# Patient Record
Sex: Male | Born: 1940 | Race: White | Hispanic: No | Marital: Married | Smoking: Former smoker
Health system: Southern US, Community
[De-identification: ages and names within clinical notes are randomized; demographics above are authoritative.]

## PROBLEM LIST (undated history)

## (undated) DIAGNOSIS — E785 Hyperlipidemia, unspecified: Secondary | ICD-10-CM

## (undated) DIAGNOSIS — I350 Nonrheumatic aortic (valve) stenosis: Secondary | ICD-10-CM

## (undated) DIAGNOSIS — I2510 Atherosclerotic heart disease of native coronary artery without angina pectoris: Secondary | ICD-10-CM

## (undated) DIAGNOSIS — J449 Chronic obstructive pulmonary disease, unspecified: Secondary | ICD-10-CM

## (undated) DIAGNOSIS — I10 Essential (primary) hypertension: Secondary | ICD-10-CM

## (undated) DIAGNOSIS — M109 Gout, unspecified: Secondary | ICD-10-CM

## (undated) HISTORY — PX: ROTATOR CUFF REPAIR: SHX139

## (undated) HISTORY — PX: CARDIAC CATHETERIZATION: SHX172

---

## 1996-07-18 HISTORY — PX: CORONARY ANGIOPLASTY WITH STENT PLACEMENT: SHX49

## 2010-03-19 ENCOUNTER — Encounter: Payer: Self-pay | Admitting: Internal Medicine

## 2010-04-15 ENCOUNTER — Encounter: Payer: Self-pay | Admitting: Internal Medicine

## 2010-10-04 ENCOUNTER — Ambulatory Visit: Payer: Self-pay | Admitting: Family Medicine

## 2011-09-21 ENCOUNTER — Encounter: Payer: Self-pay | Admitting: Internal Medicine

## 2011-10-16 ENCOUNTER — Encounter: Payer: Self-pay | Admitting: Internal Medicine

## 2011-11-16 ENCOUNTER — Encounter: Payer: Self-pay | Admitting: Internal Medicine

## 2011-11-27 ENCOUNTER — Ambulatory Visit: Payer: Self-pay

## 2011-12-17 ENCOUNTER — Encounter: Payer: Self-pay | Admitting: Internal Medicine

## 2012-03-01 ENCOUNTER — Encounter: Payer: Self-pay | Admitting: Internal Medicine

## 2012-03-15 ENCOUNTER — Encounter: Payer: Self-pay | Admitting: Internal Medicine

## 2012-04-15 ENCOUNTER — Encounter: Payer: Self-pay | Admitting: Internal Medicine

## 2012-10-04 ENCOUNTER — Ambulatory Visit: Payer: Self-pay | Admitting: Gastroenterology

## 2012-11-21 ENCOUNTER — Ambulatory Visit: Payer: Self-pay | Admitting: Internal Medicine

## 2013-11-13 ENCOUNTER — Ambulatory Visit: Payer: Self-pay | Admitting: Internal Medicine

## 2015-03-17 ENCOUNTER — Ambulatory Visit
Admission: EM | Admit: 2015-03-17 | Discharge: 2015-03-17 | Disposition: A | Discharge status: Home / Self Care | Payer: Medicare Other | Attending: Family Medicine | Admitting: Family Medicine

## 2015-03-17 DIAGNOSIS — S6981XA Other specified injuries of right wrist, hand and finger(s), initial encounter: Secondary | ICD-10-CM

## 2015-03-17 DIAGNOSIS — W458XXA Other foreign body or object entering through skin, initial encounter: Secondary | ICD-10-CM | POA: Diagnosis not present

## 2015-03-17 DIAGNOSIS — S6991XA Unspecified injury of right wrist, hand and finger(s), initial encounter: Secondary | ICD-10-CM

## 2015-03-17 HISTORY — DX: Essential (primary) hypertension: I10

## 2015-03-17 HISTORY — DX: Gout, unspecified: M10.9

## 2015-03-17 HISTORY — DX: Hyperlipidemia, unspecified: E78.5

## 2015-03-17 HISTORY — DX: Chronic obstructive pulmonary disease, unspecified: J44.9

## 2015-03-17 MED ORDER — AMOXICILLIN-POT CLAVULANATE 875-125 MG PO TABS: 1.0000 | ORAL_TABLET | Freq: Two times a day (BID) | ORAL | Status: DC

## 2015-03-17 MED ORDER — TETANUS-DIPHTH-ACELL PERTUSSIS 5-2.5-18.5 LF-MCG/0.5 IM SUSP
0.5000 mL | Freq: Once | INTRAMUSCULAR | Status: AC
  Administered 2015-03-17: 0.5 mL via INTRAMUSCULAR

## 2015-03-17 MED ORDER — LIDOCAINE HCL (PF) 1 % IJ SOLN
2.0000 mL | Freq: Once | INTRAMUSCULAR | Status: AC
  Administered 2015-03-17: 2 mL via INTRADERMAL

## 2015-03-17 NOTE — ED Notes (Signed)
Pt states "I stuck a fishhook in my right index finger 1 hours ago."

## 2015-03-17 NOTE — Discharge Instructions (Signed)
Fingertip Injuries and Amputations °Fingertip injuries are common and often get injured because they are last to escape when pulling your hand out of harm's way. You have amputated (cut off) part of your finger. How this turns out depends largely on how much was amputated. If just the tip is amputated, often the end of the finger will grow back and the finger may return to much the same as it was before the injury.  °If more of the finger is missing, your caregiver has done the best with the tissue remaining to allow you to keep as much finger as is possible. Your caregiver after checking your injury has tried to leave you with a painless fingertip that has durable, feeling skin. If possible, your caregiver has tried to maintain the finger's length and appearance and preserve its fingernail.  °Please read the instructions outlined below and refer to this sheet in the next few weeks. These instructions provide you with general information on caring for yourself. Your caregiver may also give you specific instructions. While your treatment has been done according to the most current medical practices available, unavoidable complications occasionally occur. If you have any problems or questions after discharge, please call your caregiver. °HOME CARE INSTRUCTIONS  °· You may resume normal diet and activities as directed or allowed. °· Keep your hand elevated above the level of your heart. This helps decrease pain and swelling. °· Keep ice packs (or a bag of ice wrapped in a towel) on the injured area for 15-20 minutes, 03-04 times per day, for the first two days. °· Change dressings if necessary or as directed. °· Clean the wound daily or as directed. °· Only take over-the-counter or prescription medicines for pain, discomfort, or fever as directed by your caregiver. °· Keep appointments as directed. °SEEK IMMEDIATE MEDICAL CARE IF: °· You develop redness, swelling, numbness or increasing pain in the wound. °· There is  pus coming from the wound. °· You develop an unexplained oral temperature above 102° F (38.9° C) or as your caregiver suggests. °· There is a foul (bad) smell coming from the wound or dressing. °· There is a breaking open of the wound (edges not staying together) after sutures or staples have been removed. °MAKE SURE YOU:  °· Understand these instructions. °· Will watch your condition. °· Will get help right away if you are not doing well or get worse. °Document Released: 09/22/2005 Document Revised: 01/24/2012 Document Reviewed: 08/21/2008 °ExitCare® Patient Information ©2015 [employer]. This information is not intended to replace advice given to you by your health care provider. Make sure you discuss any questions you have with your health care provider. ° °

## 2015-03-18 NOTE — ED Provider Notes (Signed)
CSN: [id number]     Arrival date & time 03/17/15  1527 History   First MD Initiated Contact with Patient 03/17/15 1616     Chief Complaint  Patient presents with  . Puncture Wound    Patient is a 74 y.o. male presenting with hand pain. The history is provided by the patient.  Hand Pain This is a new problem. The current episode started 1 to 2 hours ago (right index finger pain after getting fishhook stuck in right index finger while fishing; occurrred about 1-2 hours ago). He has tried nothing (patient does not recall his last tetanus vaccination) for the symptoms.    Past Medical History  Diagnosis Date  . Hypertension   . Gout   . Hyperlipemia   . COPD (chronic obstructive pulmonary disease)    No past surgical history on file. History reviewed. No pertinent family history. History  Substance Use Topics  . Smoking status: Never Smoker   . Smokeless tobacco: Not on file  . Alcohol Use: 0.6 oz/week    1 Shots of liquor per week    Review of Systems  Allergies  Review of patient's allergies indicates no known allergies.  Home Medications   Prior to Admission medications   Medication Sig Start Date End Date Taking? Authorizing Provider  allopurinol (ZYLOPRIM) 100 MG tablet Take 100 mg by mouth daily.   Yes Historical Provider, MD  metoprolol succinate (TOPROL-XL) 50 MG 24 hr tablet Take 50 mg by mouth daily. Take with or immediately following a meal.   Yes Historical Provider, MD  rosuvastatin (CRESTOR) 20 MG tablet Take 20 mg by mouth daily.   Yes Historical Provider, MD  amoxicillin-clavulanate (AUGMENTIN) 875-125 MG per tablet Take 1 tablet by mouth 2 (two) times daily. 03/17/15   Sean Mccallumrlando Nayab Aten, MD   BP 128/81 mmHg  Pulse 73  Temp(Src) 97.5 F (36.4 C) (Tympanic)  Resp 16  Ht 5\' 10"  (1.778 m)  Wt 245 lb (111.131 kg)  BMI 35.15 kg/m2  SpO2 99% Physical Exam  Constitutional: He appears well-developed and well-nourished. No distress.  Musculoskeletal: Normal range of  motion.       Right hand: He exhibits tenderness (moderate at dorsal side of right index finger at site of embedded fishhook). He exhibits normal range of motion, no bony tenderness, normal two-point discrimination, normal capillary refill, no deformity and no swelling. Decreased sensation is not present in the ulnar distribution, is not present in the medial distribution and is not present in the radial distribution. Normal strength noted. He exhibits no finger abduction, no thumb/finger opposition and no wrist extension trouble.       Hands: Hand fingers neurovascularly intact  Skin: He is not diaphoretic.  Nursing note and vitals reviewed.   ED Course  Procedures (including critical care time) Labs Review Labs Reviewed - No data to display  Imaging Review No results found.   MDM   1. Fishhook injury to finger, right, initial encounter    1. Area cleaned with and soaked in antiseptic/antibacterial solution; informed consent obtained for removal procedure; area anesthetized with 1% lidocaine; 2 other non-embedded fishhook prongs were cut/removed; embedded fishhook removed in its entirety by inserting an 18 gauge needle to cover the fishhook barb and slowly pulling backwards; patient tolerated procedure well. Patient able to move finger normally; normal strength.  Area wound cleaned and dressed.  2. Tetanus vaccine given  3. Prophylactic antibiotic given as per orders; risks, benefits, potential side effects reviewed with patient  4. Wound care instructions given  5. F/u prn if sign's of infection or other symptoms   Sean Mccallum, MD 03/18/15 1032

## 2015-03-26 ENCOUNTER — Telehealth: Payer: Self-pay

## 2015-11-27 ENCOUNTER — Ambulatory Visit: Payer: Medicare Other | Attending: Rheumatology | Admitting: Occupational Therapy

## 2015-11-27 DIAGNOSIS — M2560 Stiffness of unspecified joint, not elsewhere classified: Secondary | ICD-10-CM | POA: Insufficient documentation

## 2015-11-27 DIAGNOSIS — M79641 Pain in right hand: Secondary | ICD-10-CM | POA: Insufficient documentation

## 2015-11-27 NOTE — Therapy (Signed)
[facility]INE [address], [place], [address] Phone: [phone]   Fax:  [phone]  Occupational Therapy Treatment  Patient Details  Name: DEANGELO BERNS MRN: [id number] Date of Birth: Jul 19, 1941 Referring Provider: Gavin Potters  Encounter Date: 11/27/2015      OT End of Session - 11/27/15 1056    Visit Number 1   Number of Visits 3   Date for OT Re-Evaluation 12/18/15   OT Start Time 0955   OT Stop Time 1036   OT Time Calculation (min) 41 min   Activity Tolerance Patient tolerated treatment well   Behavior During Therapy [facility] for tasks assessed/performed      Past Medical History  Diagnosis Date  . Hypertension   . Gout   . Hyperlipemia   . COPD (chronic obstructive pulmonary disease)     No past surgical history on file.  There were no vitals filed for this visit.  Visit Diagnosis:  Pain of right hand - Plan: Ot plan of care cert/re-cert  Stiffness in joint - Plan: Ot plan of care cert/re-cert      Subjective Assessment - 11/27/15 1046    Subjective  Had this pain for about 2 yrs, I just push thru it - have high pain tolerance- it is the middle finger on the R  - had shot about 6 months ago on the R , 4 months on the L middle finger - it helps for few months - the come back    Patient Stated Goals Hope that I can get the pain better - it is my R hand - hurts to make tight fist - I can do most all things but just push thru it    Currently in Pain? Yes   Pain Score 9    Pain Location Finger (Comment which one)   Pain Orientation Right   Pain Descriptors / Indicators Pins and needles;Aching   Pain Type Chronic pain   Pain Onset More than a month ago   Pain Frequency Constant            [facility] OT Assessment - 11/27/15 0001    Assessment   Diagnosis R middle finger pain    Referring Provider [facility]   Onset Date 05/16/15   Assessment Pt present with pain in the R 3rd digits MC more than  PIP - report had issues for about 2 yrs now - had shots in the past - last one on the R was 6 months ago, and L 3rd digit 4 months ago - refer to hand therapy last week    Balance Screen   Has the patient fallen in the past 6 months No   Has the patient had a decrease in activity level because of a fear of falling?  No   Is the patient reluctant to leave their home because of a fear of falling?  No   Home  Environment   Lives With Spouse   Prior Function   Level of Independence Independent   Vocation Full time employment   Leisure Play golf, fish, garden, read newspaper and play on phone    Strength   Right Hand Grip (lbs) 50   Right Hand Lateral Pinch 21 lbs   Right Hand 3 Point Pinch 16 lbs   Left Hand Grip (lbs) 49   Left Hand Lateral Pinch 23 lbs   Left Hand 3 Point Pinch 20 lbs   Right Hand AROM  R Index  MCP 0-90 60 Degrees   R Long  MCP 0-90 75 Degrees   R Ring  MCP 0-90 75 Degrees   R Little  MCP 0-90 85 Degrees   Left Hand AROM   L Index  MCP 0-90 75 Degrees   L Long  MCP 0-90 80 Degrees   L Ring  MCP 0-90 85 Degrees   L Little  MCP 0-90 90 Degrees       Pt ed on joint  protection and AE - reinforce and provided hand outs Reviewed HEP for ROM                    OT Education - 11/27/15 1055    Education provided Yes   Education Details HEP, joint protection    Person(s) Educated Patient   Methods Explanation;Demonstration;Tactile cues;Verbal cues;Handout   Comprehension Verbal cues required;Returned demonstration;Verbalized understanding          OT Short Term Goals - 11/27/15 1059    OT SHORT TERM GOAL #1   Title Pt to be independent in HEP to increase MC flexion in R hand by 5-10 degrees    Baseline MC's flexion on R hand  65 to 75 2nd th 4th     Time 2   Period Weeks   Status New           OT Long Term Goals - 11/27/15 1221    OT LONG TERM GOAL #1   Title Pain on PRWHE decrease by at least 10 points    Baseline PRWHE 40/50 for  pain    Time 3   Period Weeks   Status New   OT LONG TERM GOAL #2   Title Pt to verbalize 3 joint protecion pirinciples to use in ADL's and IADL's   Baseline no knowledge - push thru pain and doing tasks   Time 3   Period Weeks   Status New               Plan - 11/27/15 1056    Clinical Impression Statement Pt present with pain in R hand more than L - 3rd digit and MC more than PIP - not edema measured - but pt present with decrease ROM in R hand all MC's , decrease grip compare to L - pt in need for education of joint protection  and adaptation -  as well as HEP to increase ROM whthout increase pain    Pt will benefit from skilled therapeutic intervention in order to improve on the following deficits (Retired) Impaired flexibility;Decreased knowledge of use of DME;Impaired UE functional use;Pain;Decreased strength   Rehab Potential Fair   OT Frequency 1x / week   OT Duration 4 weeks   OT Treatment/Interventions Self-care/ADL training;Parrafin;Contrast Bath;DME and/or AE instruction;Patient/family education;Therapeutic exercises;Ultrasound;Manual Therapy   Plan assess pain and if modified activities    OT Home Exercise Plan se pt instruction   Consulted and Agree with Plan of Care Patient        Problem List There are no active problems to display for this patient.   Oletta CohnuPreez, Ajeet Casasola OTR/L,CLT 11/27/2015, 12:40 PM  [facility] [address] [place], [place], [address] Phone: [phone]   Fax:  [phone]  Name: Eulis ManlyJoe A Lupien MRN: [id number] Date of Birth: Jul 31, 1941

## 2015-11-27 NOTE — Patient Instructions (Signed)
COntrast to do at home  Tendonglides - to slight pull - each step 8 reps  Joint protection principles reviewed and ed on - hand out  AE info review and ed on - Hand out

## 2015-12-04 ENCOUNTER — Ambulatory Visit: Payer: Medicare Other | Admitting: Occupational Therapy

## 2015-12-04 DIAGNOSIS — M79641 Pain in right hand: Secondary | ICD-10-CM

## 2015-12-04 DIAGNOSIS — M2560 Stiffness of unspecified joint, not elsewhere classified: Secondary | ICD-10-CM

## 2015-12-04 NOTE — Therapy (Signed)
[place] [facility] [address], [place], [address] Phone: [phone]   Fax:  [phone]  Occupational Therapy Treatment and discharge  Patient Details  Name: Sean Hubbard MRN: [id number] Date of Birth: 1941/04/03 Referring Provider: Jefm Bryant  Encounter Date: 12/04/2015      OT End of Session - 12/04/15 1044    Visit Number 2   Number of Visits 2   Date for OT Re-Evaluation 12/04/15   OT Start Time 1005   OT Stop Time 1036   OT Time Calculation (min) 31 min   Activity Tolerance Patient tolerated treatment well   Behavior During Therapy [facility] for tasks assessed/performed      Past Medical History  Diagnosis Date  . Hypertension   . Gout   . Hyperlipemia   . COPD (chronic obstructive pulmonary disease)     No past surgical history on file.  There were no vitals filed for this visit.  Visit Diagnosis:  Pain of right hand  Stiffness in joint      Subjective Assessment - 12/04/15 1013    Subjective  I did the exercises you told me - pain was better in my hands until this am - but I think it is the rain moving in - mostly in my middle knuckle on the R hand and L  2nd finger more than 3rd today - I did pay attention how I grip thinks , pick up things like you told met   Patient Sean Hubbard that I can get the pain better - it is my R hand - hurts to make tight fist - I can do most all things but just push thru it    Currently in Pain? Yes   Pain Score 8    Pain Location Finger (Comment which one)   Pain Orientation Right   Pain Descriptors / Indicators Aching            [facility] OT Assessment - 12/04/15 0001    Strength   Right Hand Grip (lbs) 62   Left Hand Grip (lbs) 56   Right Hand AROM   R Index  MCP 0-90 75 Degrees   R Long  MCP 0-90 80 Degrees   R Ring  MCP 0-90 85 Degrees   R Little  MCP 0-90 85 Degrees                  OT Treatments/Exercises (OP) - 12/04/15 0001    Hand Exercises   Other Hand Exercises AAROM of MC fllexion at all digits R hand , AROM  mc flexion , intrinsic fist AROM  , composite fist to 2 fingers out of palm , 1 finger and then touching palm but not tight only AROM     Other Hand Exercises pt had increase AROM coming in - after parafin improve another 5 degrees without increase pain - grip also improve bilatreral   RUE Paraffin   Number Minutes Paraffin 10 Minutes   RUE Paraffin Location Hand   Comments with heatingpad around prior to  ROM to decrease pain and increase ROM    Manual Therapy   Manual therapy comments SOft tissue mobs to 3rd MC and 2nd MC , joint mobs as well as gentle traction to 3rd digit prior to ROM - no increase pain                 OT Education - 12/04/15 1044    Education  provided Yes   Education Details see pt instruction   Person(s) Educated Patient   Methods Explanation;Demonstration;Tactile cues;Verbal cues   Comprehension Returned demonstration;Verbalized understanding;Verbal cues required          OT Short Term Goals - 2016/01/01 1047    OT SHORT TERM GOAL #1   Title Pt to be independent in HEP to increase MC flexion in R hand by 5-10 degrees    Baseline increase to 75-85 degrees    Status Achieved           OT Long Term Goals - January 01, 2016 1048    OT LONG TERM GOAL #1   Title Pain on PRWHE decrease by at least 10 points    Baseline PRWHE for pain decrease to 27/50   Status Achieved   OT LONG TERM GOAL #2   Title Pt to verbalize 3 joint protecion pirinciples to use in ADL's and IADL's   Baseline report using palms to hold objects , larger handles , avoid tight grip on holding phone or table    Status Achieved               Plan - Jan 01, 2016 1045    Clinical Impression Statement Pt report had less pain until this am - pain this am again 8/10 at [facility] [facility] R hand but after parafin decrease to 3/10 - pt showed coming in and again after session increase flexion at all digits MC flexion  on R hand - and increase grip strength - pt report understainding of joint proteciton - and met goals - pt discharge  with HEP and  recommend parafin bath     Pt will benefit from skilled therapeutic intervention in order to improve on the following deficits (Retired) Impaired flexibility;Decreased knowledge of use of DME;Impaired UE functional use;Pain;Decreased strength   Rehab Potential Fair   Plan discharge with HEP   OT Home Exercise Plan se pt instruction   Consulted and Agree with Plan of Care Patient          G-Codes - 2016-01-01 1049    Functional Assessment Tool Used PRWHE , pain , ROM , grip and clinical judgement   Functional Limitation Self care   Self Care Current Status (O8875) At least 1 percent but less than 20 percent impaired, limited or restricted   Self Care Goal Status (Z9728) At least 1 percent but less than 20 percent impaired, limited or restricted   Self Care Discharge Status [phone]) At least 1 percent but less than 20 percent impaired, limited or restricted      Problem List There are no active problems to display for this patient.   Rosalyn Gess OTR/L,CLT 01-01-2016, 10:51 AM  [place] [facility] [address], [place], [address] Phone: [phone]   Fax:  [phone]  Name: Sean Hubbard MRN: [id number] Date of Birth: 1941-06-23

## 2015-12-04 NOTE — Patient Instructions (Signed)
Cont with same HEP but during composite fist - need to grip towards base of palm   Cont with joint protection principles  And Recommend parafin bath to use in am and pm for decrease pain and increase ROM

## 2016-09-15 ENCOUNTER — Ambulatory Visit: Payer: Medicare Other | Attending: Orthopedic Surgery | Admitting: Physical Therapy

## 2016-09-15 DIAGNOSIS — M5442 Lumbago with sciatica, left side: Secondary | ICD-10-CM | POA: Diagnosis present

## 2016-09-15 DIAGNOSIS — M6281 Muscle weakness (generalized): Secondary | ICD-10-CM

## 2016-09-15 DIAGNOSIS — M2560 Stiffness of unspecified joint, not elsewhere classified: Secondary | ICD-10-CM

## 2016-09-15 DIAGNOSIS — R293 Abnormal posture: Secondary | ICD-10-CM | POA: Diagnosis present

## 2016-09-16 ENCOUNTER — Encounter: Payer: Self-pay | Admitting: Physical Therapy

## 2016-09-16 NOTE — Therapy (Addendum)
[facility] [address] [place], [place], [address] Phone: [phone]   Fax:  [phone]  Physical Therapy Evaluation  Patient Details  Name: Sean Hubbard MRN: [id number] Date of Birth: 19-Jul-1941 Referring Provider: Altamese CabalMaurice Jones, PA-C  Encounter Date: 09/15/2016    Past Medical History:  Diagnosis Date  . COPD (chronic obstructive pulmonary disease) (HCC)   . Gout   . Hyperlipemia   . Hypertension     History reviewed. No pertinent surgical history.  There were no vitals filed for this visit.    Pt. reports a "pop" in back while playing golf with swing on a downhill lie (2 weeks ago yesterday).  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  Pt. states soreness has referred into L hip down to knee and lower leg.   Pt. is taking Prednisone (5th day of 10 day course).  Pt. also using 800 mg. of ibuprofen as needed.  Pt. states he will have a MRI on 12/29 if pain is not better.  Pt. reports no pain currently at rest and 5/10 LBP with walking and >8-9/10 initially after injury/prior to Prednisone.  Pt. denies muscle weakness in LE.  No bowel and bladder issues.  Pt. reports difficulty with forward flexion/ donning and doffing shoes.      See HEP (handouts).     Pt. is a pleasant 75 y/o male with 2 weeks of acute low back pain with L hip/LE radicular symptoms after playing golf.  Pt. reports feeling a "pop" in low back during golfswing on a downhill lie.  Pt. reports 0/10 low back pain currently at rest in chair, 5/10 pain with walking and >8-9/10 L sided LBP at worst with repetitive movement/ increase activity.  Lumbar flexion limited 75% (L sided pain and increase radicular symptoms).  No pain with lumbar ext./ repeated movement.  R rotn./ lateral flexion away from L side (painful)- decrease pain with L rotn./ L lat. flexion.  B LE muscle  strength grossly 5/5 MMT.  (+) L SLR with hamstring flexibility to 35 deg. as compared to R LE 60 deg.  Significant lumbar hypomobility with PA mobs./ joint assessment.  Good pelvic alignment.  Pt. will benefit from skilled PT services to increase lumbar mobility/ core stability and pain mgmt. to promote return to playing golf.          PT Education - 09/21/16 0709    Education provided Yes   Education Details LE/lumbar stretching program.  Pt. instructed in prone press-ups/icing.     Person(s) Educated Patient   Methods Explanation;Demonstration;Tactile cues;Verbal cues;Handout   Comprehension Verbalized understanding;Returned demonstration             PT Long Term Goals - 09/21/16 0743      PT LONG TERM GOAL #1   Title Pt. I with HEP to increase L hamstring flexibility to >60 deg. to improve pain-free mobility/ return to playing golf.     Baseline L hamstring 35 deg. (+) SLR.   Time 4   Period Weeks   Status New     PT LONG TERM GOAL #2   Title Pt. will decrease Modified Oswestry to <18% to improve self-perceived disability.     Baseline MODI on 11/1  (26%)   Time 4   Period Weeks   Status New     PT LONG TERM GOAL #3  Title Pt. able to demonstrate 50% increase in lumbar flexion to improve pain-free mobility/ return to playing golf.     Baseline 75% limited lumbar flexion with increase L LE radicular symptoms.     Time 4   Period Weeks   Status New     PT LONG TERM GOAL #4   Title Pt. will report no L LE radicular symptoms over 1 week to increase pain-free mobility with all tasks.    Baseline L hip/ LE radicular symptoms.     Time 4   Period Weeks   Status New     PT LONG TERM GOAL #5   Title Pt. able to return to playing 18 holes of golf with no increase c/o low back pain/ radicular symptoms.     Baseline unable to play golf at this time due to pain.    Time 4   Period Weeks   Status New       Patient will benefit from skilled therapeutic intervention  in order to improve the following deficits and impairments:  Decreased activity tolerance, Decreased endurance, Decreased range of motion, Decreased strength, Hypomobility, Pain, Improper body mechanics, Postural dysfunction, Increased muscle spasms, Impaired flexibility  Visit Diagnosis: Acute bilateral low back pain with left-sided sciatica  Joint stiffness  Muscle weakness (generalized)  Abnormal posture     Problem List There are no active problems to display for this patient.  Cammie McgeeMichael C Rhythm Gubbels, PT, DPT # [id number] Michaelyn BarterLaura Bissing, SPT 09/21/2016, 7:52 AM  [facility] [address] [place], [place], [address] Phone: [phone]   Fax:  [phone]  Name: Sean Hubbard MRN: [id number] Date of Birth: Apr 07, 1941

## 2016-09-21 ENCOUNTER — Ambulatory Visit: Payer: Medicare Other | Admitting: Physical Therapy

## 2016-09-21 DIAGNOSIS — M5442 Lumbago with sciatica, left side: Secondary | ICD-10-CM | POA: Diagnosis not present

## 2016-09-21 DIAGNOSIS — M2560 Stiffness of unspecified joint, not elsewhere classified: Secondary | ICD-10-CM

## 2016-09-21 DIAGNOSIS — R293 Abnormal posture: Secondary | ICD-10-CM

## 2016-09-21 DIAGNOSIS — M6281 Muscle weakness (generalized): Secondary | ICD-10-CM

## 2016-09-21 NOTE — Addendum Note (Signed)
Addended by: Dorene GrebeSHERK, Abdulrahim Siddiqi C on: 09/21/2016 08:00 AM   Modules accepted: Orders

## 2016-09-22 ENCOUNTER — Encounter: Payer: Self-pay | Admitting: Physical Therapy

## 2016-09-22 NOTE — Therapy (Signed)
[facility] [address] [place], [place], [address] Phone: [phone]   Fax:  [phone]  Physical Therapy Treatment  Patient Details  Name: Sean Hubbard MRN: [id number] Date of Birth: [birth year] Referring Provider: Altamese CabalMaurice Jones, PA-C  Encounter Date: 09/21/2016      PT End of Session - 09/22/16 1727    Visit Number 2   Number of Visits 8   Date for PT Re-Evaluation 10/13/16   Authorization - Visit Number 2   Authorization - Number of Visits 10   PT Start Time 0811   PT Stop Time 0905   PT Time Calculation (min) 54 min   Activity Tolerance Patient tolerated treatment well;No increased pain   Behavior During Therapy WFL for tasks assessed/performed      Past Medical History:  Diagnosis Date  . COPD (chronic obstructive pulmonary disease) (HCC)   . Gout   . Hyperlipemia   . Hypertension     History reviewed. No pertinent surgical history.  There were no vitals filed for this visit.      Subjective Assessment - 09/22/16 1724    Subjective Pt. states he is about 60% better and doing HEP/stretches.  Pt. was able to go fishing with no significant limitations but still uable to play golf.  Pt. hoping to return to playing golf next Tuesday.     Limitations Standing;Lifting;Sitting;Walking;House hold activities;Other (comment)   Currently in Pain? Yes   Pain Score 2    Pain Location Back   Pain Orientation Lower;Left;Mid      OBJECTIVE:  There.ex.:  Reviewed HEP/  Standing lumbar AROM (all planes)- prefers extension.  Manual tx.: Supine LE/lumbar stretches (13 min.).  Prone lumbar extension 4x.  Prone grade II-III PA mobs. 2x 20 sec. (L1-5 region).  No tenderness or pain over SI joint/ lumbar region.      Pt response for medical necessity:  Pt. Benefits from lumbar/LE flexibility to increase mobility and promote return to playing golf.  Pt. Progressing well but remains limited with significant L hamstring tightness/  (+) SLR.         PT Long Term Goals - 09/21/16 0743      PT LONG TERM GOAL #1   Title Pt. I with HEP to increase L hamstring flexibility to >60 deg. to improve pain-free mobility/ return to playing golf.     Baseline L hamstring 35 deg. (+) SLR.   Time 4   Period Weeks   Status New     PT LONG TERM GOAL #2   Title Pt. will decrease Modified Oswestry to <18% to improve self-perceived disability.     Baseline MODI on 11/1  (26%)   Time 4   Period Weeks   Status New     PT LONG TERM GOAL #3   Title Pt. able to demonstrate 50% increase in lumbar flexion to improve pain-free mobility/ return to playing golf.     Baseline 75% limited lumbar flexion with increase L LE radicular symptoms.     Time 4   Period Weeks   Status New     PT LONG TERM GOAL #4   Title Pt. will report no L LE radicular symptoms over 1 week to increase pain-free mobility with all tasks.    Baseline L hip/ LE radicular symptoms.     Time 4   Period Weeks   Status New     PT LONG TERM GOAL #5   Title Pt. able  to return to playing 18 holes of golf with no increase c/o low back pain/ radicular symptoms.     Baseline unable to play golf at this time due to pain.    Time 4   Period Weeks   Status New           Plan - 09/22/16 1728    Clinical Impression Statement Decrease LBP with repeated extension in prone and standing posture.  Pt. remains limited with L SLR/ hip/ lumbar discomfort with flexion/ standing rotn.  No pain in lumbar region with prone grade II-III central/unilateral mobs.  Reassess goals/ modified golf swing next tx. session to determine if return to golf next Tuesday is appropriate.     Rehab Potential Good   PT Frequency 2x / week   PT Duration 4 weeks   PT Treatment/Interventions ADLs/Self Care Home Management;Cryotherapy;Electrical Stimulation;Moist Heat;Gait training;Functional mobility training;Therapeutic activities;Therapeutic exercise;Patient/family education;Neuromuscular  re-education;Manual techniques;Passive range of motion;Energy conservation   PT Next Visit Plan Progress lumbar mobility/ modify golf swing.     PT Home Exercise Plan See handouts   Consulted and Agree with Plan of Care Patient      Patient will benefit from skilled therapeutic intervention in order to improve the following deficits and impairments:  Decreased activity tolerance, Decreased endurance, Decreased range of motion, Decreased strength, Hypomobility, Pain, Improper body mechanics, Postural dysfunction, Increased muscle spasms, Impaired flexibility  Visit Diagnosis: Acute bilateral low back pain with left-sided sciatica  Joint stiffness  Muscle weakness (generalized)  Abnormal posture     Problem List There are no active problems to display for this patient.  Cammie McgeeMichael C Odin Mariani, PT, DPT # [phone] 09/22/2016, 5:32 PM  [place] [facility] [address] [place], [place], [address] Phone: [phone]   Fax:  [phone]  Name: Sean Hubbard MRN: [id number] Date of Birth: April 30, 1941

## 2016-09-23 ENCOUNTER — Ambulatory Visit: Payer: Medicare Other | Admitting: Physical Therapy

## 2016-09-23 ENCOUNTER — Encounter: Payer: Self-pay | Admitting: Physical Therapy

## 2016-09-23 DIAGNOSIS — M6281 Muscle weakness (generalized): Secondary | ICD-10-CM

## 2016-09-23 DIAGNOSIS — M2560 Stiffness of unspecified joint, not elsewhere classified: Secondary | ICD-10-CM

## 2016-09-23 DIAGNOSIS — R293 Abnormal posture: Secondary | ICD-10-CM

## 2016-09-23 DIAGNOSIS — M5442 Lumbago with sciatica, left side: Secondary | ICD-10-CM | POA: Diagnosis not present

## 2016-09-23 NOTE — Therapy (Signed)
[facility] [address] [place], [place], [address] Phone: [phone]   Fax:  [phone]  Physical Therapy Treatment  Patient Details  Name: Sean Hubbard MRN: [id number] Date of Birth: [birth year] Referring Provider: Altamese CabalMaurice Jones, PA-C  Encounter Date: 09/23/2016      PT End of Session - 09/23/16 0813    Visit Number 3   Number of Visits 8   Date for PT Re-Evaluation 10/13/16   Authorization - Visit Number 3   Authorization - Number of Visits 10   PT Start Time 0808   Activity Tolerance Patient tolerated treatment well;No increased pain   Behavior During Therapy WFL for tasks assessed/performed      Past Medical History:  Diagnosis Date  . COPD (chronic obstructive pulmonary disease) (HCC)   . Gout   . Hyperlipemia   . Hypertension     History reviewed. No pertinent surgical history.  There were no vitals filed for this visit.      Subjective Assessment - 09/23/16 0811    Subjective Pt. reports he did something stupid yesterday and vacuumed the house with increase c/o back pain.  Pt. entered PT clinic with moderate L antalgic gait pattern/ lateral sway.  Pt. cancelled golf tee time for next Tuesday due to L LBP.     Limitations Standing;Lifting;Sitting;Walking;House hold activities;Other (comment)   Patient Stated Goals Decrease LBP/ return to golf   Currently in Pain? Yes   Pain Score 3    Pain Location Back   Pain Orientation Left;Mid;Lower   Pain Type Acute pain      OBJECTIVE:  There.ex.:  Nustep L7 5 min. (warm-up/ no charge prior to stretching).  Manual tx.: Supine LE/lumbar stretches (15 min.).  Prone quad/hip flexor stretches 3x each.  Prone grade II-III PA mobs. 2x 20 sec. (T5-L5 region).  No tenderness or pain over SI joint/ lumbar region.                 Pt response for medical necessity:  Pt. Benefits from lumbar/LE flexibility to increase mobility and promote return to playing golf.  Pt.  Progressing well but remains limited with significant L hamstring tightness/ (+) SLR.         PT Long Term Goals - 09/21/16 0743      PT LONG TERM GOAL #1   Title Pt. I with HEP to increase L hamstring flexibility to >60 deg. to improve pain-free mobility/ return to playing golf.     Baseline L hamstring 35 deg. (+) SLR.   Time 4   Period Weeks   Status New     PT LONG TERM GOAL #2   Title Pt. will decrease Modified Oswestry to <18% to improve self-perceived disability.     Baseline MODI on 11/1  (26%)   Time 4   Period Weeks   Status New     PT LONG TERM GOAL #3   Title Pt. able to demonstrate 50% increase in lumbar flexion to improve pain-free mobility/ return to playing golf.     Baseline 75% limited lumbar flexion with increase L LE radicular symptoms.     Time 4   Period Weeks   Status New     PT LONG TERM GOAL #4   Title Pt. will report no L LE radicular symptoms over 1 week to increase pain-free mobility with all tasks.    Baseline L hip/ LE radicular symptoms.     Time 4  Period Weeks   Status New     PT LONG TERM GOAL #5   Title Pt. able to return to playing 18 holes of golf with no increase c/o low back pain/ radicular symptoms.     Baseline unable to play golf at this time due to pain.    Time 4   Period Weeks   Status New            Plan - 09/23/16 0813    Clinical Impression Statement Significant thoracic/lumbar hypomobility with prone PA mobs. (multi-levels).  Pt. has no pain with supine lumbar rotn. but limited with L hamstring length >30 deg.    Rehab Potential Good   PT Frequency 2x / week   PT Duration 4 weeks   PT Treatment/Interventions ADLs/Self Care Home Management;Cryotherapy;Electrical Stimulation;Moist Heat;Gait training;Functional mobility training;Therapeutic activities;Therapeutic exercise;Patient/family education;Neuromuscular re-education;Manual techniques;Passive range of motion;Energy conservation   PT Next Visit Plan Progress  lumbar mobility/ modify golf swing.     PT Home Exercise Plan See handouts   Consulted and Agree with Plan of Care Patient      Patient will benefit from skilled therapeutic intervention in order to improve the following deficits and impairments:  Decreased activity tolerance, Decreased endurance, Decreased range of motion, Decreased strength, Hypomobility, Pain, Improper body mechanics, Postural dysfunction, Increased muscle spasms, Impaired flexibility  Visit Diagnosis: Acute bilateral low back pain with left-sided sciatica  Joint stiffness  Muscle weakness (generalized)  Abnormal posture     Problem List There are no active problems to display for this patient.  Cammie McgeeMichael C Noura Purpura, PT, DPT # [phone] 09/23/2016, 8:39 AM  [place] [facility] [address] [place], [place], [address] Phone: [phone]   Fax:  [phone]  Name: Sean Hubbard MRN: [id number] Date of Birth: May 12, 1941

## 2016-09-28 ENCOUNTER — Ambulatory Visit: Payer: Medicare Other | Admitting: Physical Therapy

## 2016-09-28 DIAGNOSIS — M2560 Stiffness of unspecified joint, not elsewhere classified: Secondary | ICD-10-CM

## 2016-09-28 DIAGNOSIS — M6281 Muscle weakness (generalized): Secondary | ICD-10-CM

## 2016-09-28 DIAGNOSIS — M5442 Lumbago with sciatica, left side: Secondary | ICD-10-CM | POA: Diagnosis not present

## 2016-09-28 DIAGNOSIS — R293 Abnormal posture: Secondary | ICD-10-CM

## 2016-09-28 NOTE — Therapy (Signed)
[place] [facility] [address] [place], [place], [address] Phone: [phone]   Fax:  [phone]  Physical Therapy Treatment  Patient Details  Name: Sean Hubbard MRN: [id number] Date of Birth: [birth year] Referring Provider: Altamese CabalMaurice Jones, PA-C  Encounter Date: 09/28/2016      PT End of Session - 09/28/16 0917    Visit Number 4   Number of Visits 8   Date for PT Re-Evaluation 10/13/16   Authorization - Visit Number 4   Authorization - Number of Visits 10   PT Start Time 0808   PT Stop Time 0901   PT Time Calculation (min) 53 min   Activity Tolerance Patient tolerated treatment well   Behavior During Therapy [facility] for tasks assessed/performed      Past Medical History:  Diagnosis Date  . COPD (chronic obstructive pulmonary disease) (HCC)   . Gout   . Hyperlipemia   . Hypertension     No past surgical history on file.  There were no vitals filed for this visit.      Subjective Assessment - 09/28/16 0915    Subjective Pt states that his back/leg seems to be doing better overall; reports no pain when he arrives for PT session. States that he cancelled his tee times for this week and will be taking a family vacation next week over the Thanksgiving holiday; does not expect to be back to golfing for several weeks.   Limitations Standing;Lifting;Sitting;Walking;House hold activities;Other (comment)   Patient Stated Goals Decrease LBP/ return to golf   Currently in Pain? No/denies     Objective:  Therapeutic Exercise: Ambulation on treadmill 1.3 mph 7 minutes (no charge/warm up). Standing hamstring stretch 30 sec x2 R/L with neural flossing of sciatic nerve. Standing lumbar rotation with golf club x15 R/L. Standing lateral flexion with 15 sec static holds to maintain gentle stretch x10 R/L.   Manual tx: Supine hamstring stretch 1 minute x4 LLE; sciatic nerve flossing dorsiflexion/plantarflexion of L foot to pt tolerance.  Piriformis stretch 30 sec x3 LLE. Prone quadriceps stretch 30 sec x3 LLE, 30 sec x2 RLE. Deep STM of L hamstring medial/lateral; pt with no c/o of pain but increase mild increase in tone noted. STM of piriformis, L paraspinals and QL. PA mobilization L sacrum (3 bouts, 30 sec, Grade III).   Pt response for medical necessity: Pt with c/o of 3/10 leg pain following short bout of treadmill ambulation; pain resolves with rest. Pt leg sx are most aggravated by hamstring stretch/lengthening of sciatic nerve. Pt with no c/o with STM and passive accessories. Pt with continue to benefit from lumbar/LE mobility exercises to promote improved tolerance of daily/leisure activities.      PT Education - 09/28/16 0917    Education provided Yes   Education Details HEP addition including: prone quad stretch, lumbar lateral flexion/rotation.   Person(s) Educated Patient   Methods Explanation;Demonstration   Comprehension Verbalized understanding;Returned demonstration             PT Long Term Goals - 09/21/16 0743      PT LONG TERM GOAL #1   Title Pt. I with HEP to increase L hamstring flexibility to >60 deg. to improve pain-free mobility/ return to playing golf.     Baseline L hamstring 35 deg. (+) SLR.   Time 4   Period Weeks   Status New     PT LONG TERM GOAL #2   Title Pt. will decrease Modified Oswestry to <18%  to improve self-perceived disability.     Baseline MODI on 11/1  (26%)   Time 4   Period Weeks   Status New     PT LONG TERM GOAL #3   Title Pt. able to demonstrate 50% increase in lumbar flexion to improve pain-free mobility/ return to playing golf.     Baseline 75% limited lumbar flexion with increase L LE radicular symptoms.     Time 4   Period Weeks   Status New     PT LONG TERM GOAL #4   Title Pt. will report no L LE radicular symptoms over 1 week to increase pain-free mobility with all tasks.    Baseline L hip/ LE radicular symptoms.     Time 4   Period Weeks   Status  New     PT LONG TERM GOAL #5   Title Pt. able to return to playing 18 holes of golf with no increase c/o low back pain/ radicular symptoms.     Baseline unable to play golf at this time due to pain.    Time 4   Period Weeks   Status New               Plan - 09/28/16 [id number]    Clinical Impression Statement Pt with limited tolerance to hamstring stretch (increase in posterior leg pain 5/10) with >30 deg. No c/o of deep palpation of L hamstring medial/lateral but considerable muscle tension. Pt limited in quadriceps mobility R/L; c/o of increased sensation of stretch on LLE in prone. Pt tolerates standing lower lumbar mobility exercises with initial stiffnes/increase in pain with standing rotation but abatement of pain after multiple reps.   Rehab Potential Good   PT Frequency 2x / week   PT Duration 4 weeks   PT Treatment/Interventions ADLs/Self Care Home Management;Cryotherapy;Electrical Stimulation;Moist Heat;Gait training;Functional mobility training;Therapeutic activities;Therapeutic exercise;Patient/family education;Neuromuscular re-education;Manual techniques;Passive range of motion;Energy conservation   PT Next Visit Plan Progress lumbar mobility/ modify golf swing.     PT Home Exercise Plan See handouts   Consulted and Agree with Plan of Care Patient      Patient will benefit from skilled therapeutic intervention in order to improve the following deficits and impairments:  Decreased activity tolerance, Decreased endurance, Decreased range of motion, Decreased strength, Hypomobility, Pain, Improper body mechanics, Postural dysfunction, Increased muscle spasms, Impaired flexibility  Visit Diagnosis: Acute bilateral low back pain with left-sided sciatica  Joint stiffness  Muscle weakness (generalized)  Abnormal posture     Problem List There are no active problems to display for this patient.  Cammie McgeeMichael C Sherk, PT, DPT # [id number] Vernona RiegerLaura Nickolus Wadding SPT 09/28/2016, 9:29  AM  [place] [facility] [address] [place], [place], [address] Phone: [phone]   Fax:  [phone]  Name: Sean Hubbard MRN: [id number] Date of Birth: 16-Aug-1941

## 2016-09-30 ENCOUNTER — Ambulatory Visit: Payer: Medicare Other | Admitting: Physical Therapy

## 2016-09-30 ENCOUNTER — Encounter: Payer: Self-pay | Admitting: Physical Therapy

## 2016-09-30 DIAGNOSIS — M2560 Stiffness of unspecified joint, not elsewhere classified: Secondary | ICD-10-CM

## 2016-09-30 DIAGNOSIS — R293 Abnormal posture: Secondary | ICD-10-CM

## 2016-09-30 DIAGNOSIS — M5442 Lumbago with sciatica, left side: Secondary | ICD-10-CM | POA: Diagnosis not present

## 2016-09-30 DIAGNOSIS — M6281 Muscle weakness (generalized): Secondary | ICD-10-CM

## 2016-09-30 NOTE — Therapy (Signed)
[facility] [address] [place], [place], [address] Phone: [phone]   Fax:  [phone]  Physical Therapy Treatment  Patient Details  Name: Sean Hubbard MRN: [id number] Date of Birth: Nov 02, 1941 Referring Provider: Altamese CabalMaurice Jones, PA-C  Encounter Date: 09/30/2016      PT End of Session - 09/30/16 0906    Visit Number 5   Number of Visits 8   Date for PT Re-Evaluation 10/13/16   Authorization - Visit Number 5   Authorization - Number of Visits 10   PT Start Time 0805   PT Stop Time 0905   PT Time Calculation (min) 60 min   Activity Tolerance Patient tolerated treatment well   Behavior During Therapy [facility] for tasks assessed/performed      Past Medical History:  Diagnosis Date  . COPD (chronic obstructive pulmonary disease) (HCC)   . Gout   . Hyperlipemia   . Hypertension     History reviewed. No pertinent surgical history.  There were no vitals filed for this visit.      Subjective Assessment - 09/30/16 0809    Subjective Pt states that he has been feeling better. Gradual progression to being able to tolerate increased exercise. He is not back to golfing yet.    Limitations Standing;Lifting;Sitting;Walking;House hold activities;Other (comment)   Patient Stated Goals Decrease LBP/ return to golf   Currently in Pain? No/denies     Objective:  Therapeutic Exercise: SciFit 10 minutes Level 5.5 (warm up/no charge) Seated child's pose with theraball roll outs R/L bias 30 sec x10 ea. Open book R/ x20 ea with 10 sec holds at end range. Supine lumbar rotation with theraball at ankle x15 R/L to pt tolerance; pt reports onset of L sided buttock pain with initial R rotation - abates with multiple reps. Manual tx: Supine stretching program including: Hamstring stretch distal/proximal 30 sec x2 R/L. Sciatic nerve flossing with knee ext to pt tolerance x10 5 sec holds. Piriformis stretch 30 sec x2 R/L. Knees to chest 30 sec x2  R/L. LAD R/L 3 bouts, 20 sec. Passive accessory to CPA/UPA R/L T10-L3 (Grade III, 2 bouts, 30 sec); pt with moderate hypomobility.   Pt response for medical necessity: Pt with overall good tolerance of physical therapy session. He is able to perform aerobic exercise on SciFit with no c/o of pain. He continues to be limited in LE mobility secondary to onset of sciatic pain. Will benefit from cont emphasis on lumbar/LE mobility to decrease stiffness and increase tolerance to physical activity.      PT Education - 09/30/16 0906    Education provided Yes   Education Details HEP including: lumbar rotation, child's pose and open book for lumbar/thoracic mobility.   Person(s) Educated Patient   Methods Explanation;Demonstration;Handout   Comprehension Verbalized understanding;Returned demonstration             PT Long Term Goals - 09/21/16 0743      PT LONG TERM GOAL #1   Title Pt. I with HEP to increase L hamstring flexibility to >60 deg. to improve pain-free mobility/ return to playing golf.     Baseline L hamstring 35 deg. (+) SLR.   Time 4   Period Weeks   Status New     PT LONG TERM GOAL #2   Title Pt. will decrease Modified Oswestry to <18% to improve self-perceived disability.     Baseline MODI on 11/1  (26%)   Time 4   Period Weeks  Status New     PT LONG TERM GOAL #3   Title Pt. able to demonstrate 50% increase in lumbar flexion to improve pain-free mobility/ return to playing golf.     Baseline 75% limited lumbar flexion with increase L LE radicular symptoms.     Time 4   Period Weeks   Status New     PT LONG TERM GOAL #4   Title Pt. will report no L LE radicular symptoms over 1 week to increase pain-free mobility with all tasks.    Baseline L hip/ LE radicular symptoms.     Time 4   Period Weeks   Status New     PT LONG TERM GOAL #5   Title Pt. able to return to playing 18 holes of golf with no increase c/o low back pain/ radicular symptoms.     Baseline  unable to play golf at this time due to pain.    Time 4   Period Weeks   Status New            Plan - 09/30/16 0907    Clinical Impression Statement Pt with increased tolerance to LE mobility esp hamstring mobility but remains limited with onset of sciatic pain in posterior leg. Pt benefits from manual LE stretching, lumbar/thoracic mobility exercises to decrease stiffness/increase global mobility. Pt with moderate hypomobility to UPA/CPA of thoracic spine. Pt demonstrates onset of L sided buttock pain with right rotation with feet on theraball; able to tolerate increased mobility following multiple reps.    Rehab Potential Good   PT Frequency 2x / week   PT Duration 4 weeks   PT Treatment/Interventions ADLs/Self Care Home Management;Cryotherapy;Electrical Stimulation;Moist Heat;Gait training;Functional mobility training;Therapeutic activities;Therapeutic exercise;Patient/family education;Neuromuscular re-education;Manual techniques;Passive range of motion;Energy conservation   PT Next Visit Plan Progress lumbar mobility, LE mobility. Assess golf swing/dynamic balance.   PT Home Exercise Plan See handouts   Consulted and Agree with Plan of Care Patient      Patient will benefit from skilled therapeutic intervention in order to improve the following deficits and impairments:  Decreased activity tolerance, Decreased endurance, Decreased range of motion, Decreased strength, Hypomobility, Pain, Improper body mechanics, Postural dysfunction, Increased muscle spasms, Impaired flexibility  Visit Diagnosis: Acute bilateral low back pain with left-sided sciatica  Joint stiffness  Muscle weakness (generalized)  Abnormal posture     Problem List There are no active problems to display for this patient.  Cammie McgeeMichael C Sherk, PT, DPT # [phone] Vernona RiegerLaura Mantaj Chamberlin SPT 09/30/2016, 9:18 AM  [facility] [address] [place], [place], [address] Phone:  [phone]   Fax:  [phone]  Name: Sean Hubbard MRN: [id number] Date of Birth: 1940/12/01

## 2016-10-05 ENCOUNTER — Encounter: Payer: Medicare Other | Admitting: Physical Therapy

## 2016-10-12 ENCOUNTER — Ambulatory Visit: Payer: Medicare Other | Admitting: Physical Therapy

## 2016-10-12 ENCOUNTER — Encounter: Payer: Self-pay | Admitting: Physical Therapy

## 2016-10-12 DIAGNOSIS — M5442 Lumbago with sciatica, left side: Secondary | ICD-10-CM

## 2016-10-12 DIAGNOSIS — M6281 Muscle weakness (generalized): Secondary | ICD-10-CM

## 2016-10-12 DIAGNOSIS — M2560 Stiffness of unspecified joint, not elsewhere classified: Secondary | ICD-10-CM

## 2016-10-12 DIAGNOSIS — R293 Abnormal posture: Secondary | ICD-10-CM

## 2016-10-13 NOTE — Therapy (Signed)
[place] [facility] [address] [place], [place], [address] Phone: [phone]   Fax:  [phone]  Physical Therapy Treatment  Patient Details  Name: Sean Hubbard MRN: [id number] Date of Birth: [birth year] Referring Provider: Altamese CabalMaurice Jones, PA-C  Encounter Date: 10/12/2016      PT End of Session - 10/12/16 0821    Visit Number 6   Number of Visits 8   Date for PT Re-Evaluation 10/13/16   Authorization - Visit Number 6   Authorization - Number of Visits 10   PT Start Time 0807   PT Stop Time 0904   PT Time Calculation (min) 57 min   Activity Tolerance Patient tolerated treatment well   Behavior During Therapy [facility] for tasks assessed/performed      Past Medical History:  Diagnosis Date  . COPD (chronic obstructive pulmonary disease) (HCC)   . Gout   . Hyperlipemia   . Hypertension     History reviewed. No pertinent surgical history.  There were no vitals filed for this visit.      Subjective Assessment - 10/12/16 0814    Subjective Pt. reports a persistent discomfort (2/10) in L buttocks down LE.  Pt. [place] over [place] and hasn't been too compliant with stretching/ HEP.  Pt. has not tried to swing a golf club.  Pt. returns to [place] next weeks (pt. will check on date before next visit).     Limitations Standing;Lifting;Sitting;Walking;House hold activities;Other (comment)   Patient Stated Goals Decrease LBP/ return to golf   Currently in Pain? Yes   Pain Score 2    Pain Location Back   Pain Orientation Left;Lower      Objective:  Therapeutic Exercise: SciFit 10 minutes Level 6 (warm up/no charge). Open book R/ x20 ea with 10 sec holds at end range. Supine lumbar rotation with gentle overpressure to pt tolerance; pt reports onset of L sided buttock pain with initial R rotation - abates with multiple reps.  Standing lumbar AROM (all planes)- pain tolerable.   Manual tx: Supine stretching program  including: Hamstring stretch distal/proximal 30 sec x2 R/L. Sciatic nerve flossing with knee ext to pt tolerance x10 5 sec holds. Piriformis stretch 30 sec x2 R/L. Knees to chest 30 sec x2 R/L. LAD R/L 3 bouts, 20 sec. Passive accessory to CPA/UPA R/L T10-L3 (Grade III, 2 bouts, 30 sec); pt with moderate hypomobility.   Pt response for medical necessity: Pt with overall good tolerance of physical therapy session. He is able to perform aerobic exercise on SciFit with no c/o of pain. He continues to be limited in L LE mobility secondary to onset of sciatic pain. Will benefit from cont emphasis on lumbar/LE mobility to decrease stiffness and increase tolerance to physical activity.       PT Long Term Goals - 09/21/16 0743      PT LONG TERM GOAL #1   Title Pt. I with HEP to increase L hamstring flexibility to >60 deg. to improve pain-free mobility/ return to playing golf.     Baseline L hamstring 35 deg. (+) SLR.   Time 4   Period Weeks   Status New     PT LONG TERM GOAL #2   Title Pt. will decrease Modified Oswestry to <18% to improve self-perceived disability.     Baseline MODI on 11/1  (26%)   Time 4   Period Weeks   Status New     PT LONG TERM  GOAL #3   Title Pt. able to demonstrate 50% increase in lumbar flexion to improve pain-free mobility/ return to playing golf.     Baseline 75% limited lumbar flexion with increase L LE radicular symptoms.     Time 4   Period Weeks   Status New     PT LONG TERM GOAL #4   Title Pt. will report no L LE radicular symptoms over 1 week to increase pain-free mobility with all tasks.    Baseline L hip/ LE radicular symptoms.     Time 4   Period Weeks   Status New     PT LONG TERM GOAL #5   Title Pt. able to return to playing 18 holes of golf with no increase c/o low back pain/ radicular symptoms.     Baseline unable to play golf at this time due to pain.    Time 4   Period Weeks   Status New               Plan - 10/13/16 0739     Clinical Impression Statement No significant trigger point noted in L piriformis/ glut med region with deep palpation.  Pt. continues to remain limited with significant L prox./ distal hamstring tightness.  Difficulty tolerating stretching/ neural glides of L LE in supine or sitting positions due to pain/ compensations.  No pain during Scifit/ standing ther.ex.    Rehab Potential Good   PT Frequency 2x / week   PT Duration 4 weeks   PT Treatment/Interventions ADLs/Self Care Home Management;Cryotherapy;Electrical Stimulation;Moist Heat;Gait training;Functional mobility training;Therapeutic activities;Therapeutic exercise;Patient/family education;Neuromuscular re-education;Manual techniques;Passive range of motion;Energy conservation   PT Next Visit Plan Progress lumbar mobility, LE mobility. Assess golf swing/dynamic balance.  Recert/ goal reassessment next tx. session.     PT Home Exercise Plan See handouts   Consulted and Agree with Plan of Care Patient      Patient will benefit from skilled therapeutic intervention in order to improve the following deficits and impairments:  Decreased activity tolerance, Decreased endurance, Decreased range of motion, Decreased strength, Hypomobility, Pain, Improper body mechanics, Postural dysfunction, Increased muscle spasms, Impaired flexibility  Visit Diagnosis: Acute bilateral low back pain with left-sided sciatica  Joint stiffness  Muscle weakness (generalized)  Abnormal posture     Problem List There are no active problems to display for this patient.  Cammie McgeeMichael C Seaborn Nakama, PT, DPT # [id number] 10/13/2016, 7:51 AM  [place] [facility] [address] [place], [place], [address] Phone: [phone]   Fax:  [phone]  Name: Sean Hubbard MRN: [id number] Date of Birth: 1941/02/24

## 2016-10-14 ENCOUNTER — Ambulatory Visit: Payer: Medicare Other | Admitting: Physical Therapy

## 2016-10-14 DIAGNOSIS — M5442 Lumbago with sciatica, left side: Secondary | ICD-10-CM | POA: Diagnosis not present

## 2016-10-14 DIAGNOSIS — M6281 Muscle weakness (generalized): Secondary | ICD-10-CM

## 2016-10-14 DIAGNOSIS — M2560 Stiffness of unspecified joint, not elsewhere classified: Secondary | ICD-10-CM

## 2016-10-14 DIAGNOSIS — R293 Abnormal posture: Secondary | ICD-10-CM

## 2016-10-14 NOTE — Therapy (Signed)
[facility] [address] [place], [place], [address] Phone: [phone]   Fax:  [phone]  Physical Therapy Treatment  Patient Details  Name: FAIRLEY COPHER MRN: [id number] Date of Birth: [birth year] Referring Provider: Carlynn Spry, PA-C  Encounter Date: 10/14/2016      PT End of Session - 10/14/16 0810    Visit Number 7   Number of Visits 14   Date for PT Re-Evaluation 11/11/16   Authorization - Visit Number 7   Authorization - Number of Visits 16   PT Start Time 0803   PT Stop Time 0901   PT Time Calculation (min) 58 min   Activity Tolerance Patient tolerated treatment well   Behavior During Therapy [facility] for tasks assessed/performed      Past Medical History:  Diagnosis Date  . COPD (chronic obstructive pulmonary disease) ([place])   . Gout   . Hyperlipemia   . Hypertension     No past surgical history on file.  There were no vitals filed for this visit.      Subjective Assessment - 10/14/16 0807    Subjective Pt. reports he is doing better but still having L buttocks discomfort.   No c/o pain while riding Scifit.  Pt. went fishing yesterday with no increase c/o L lumbar/buttocks pain.  Pt. able to move more freely with less c/o pain.     Limitations Standing;Lifting;Sitting;Walking;House hold activities;Other (comment)   Patient Stated Goals Decrease LBP/ return to golf   Currently in Pain? No/denies      Objective:  Therapeutic Exercise: SciFit 10 minutes Level 6 (warm up/no charge). Standing lumbar AROM (all planes) as tolerated for reassessment.  Standing YTB B UE diagonals/ D1/D2 and horizontal abd. 20x each (fatigue reported but no c/o back pain).  Supine lumbar rotation with gentle overpressure to pt tolerance 5x each.   Manual tx: Supine stretching program including: Hamstring stretch distal/proximal 30 sec x2 R/L. Sciatic nerve flossing with knee ext to pt tolerance x10 5 sec holds. Piriformis stretch 30  sec x2 R/L. Knees to chest 30 sec x2 R/L. LAD R/L 3 bouts, 20 sec.  Prone passive accessory to CPA/UPA R/L T10-L3 (Grade III, 2 bouts, 30 sec); pt with moderate hypomobility.  MH to lumbar spine in sitting after tx. While completing MODI and MMT.   Pt response for medical necessity: Pt with overall good tolerance of physical therapy session. He is able to perform aerobic exercise on SciFit with no c/o of pain. He continues to be limited in L LE mobility secondary to onset of sciatic pain. Will benefit from cont emphasis on lumbar/LE mobility to decrease stiffness and increase tolerance to physical activity.       PT Long Term Goals - 10/14/16 1003      PT LONG TERM GOAL #1   Title Pt. I with HEP to increase L hamstring flexibility to >60 deg. to improve pain-free mobility/ return to playing golf.     Baseline L hamstring 35 deg. (+) SLR.   Time 4   Period Weeks   Status Not Met     PT LONG TERM GOAL #2   Title Pt. will decrease Modified Oswestry to <18% to improve self-perceived disability.     Baseline MODI on 11/30  (12%)- marked improvement since initial evaluation   Time 4   Period Weeks   Status Achieved     PT LONG TERM GOAL #3   Title Pt. able to demonstrate 50%  increase in lumbar flexion to improve pain-free mobility/ return to playing golf.     Baseline Significant lumbar/ hamstring stiffness with standing flexion.     Time 4   Period Weeks   Status Not Met     PT LONG TERM GOAL #4   Title Pt. will report no L LE radicular symptoms over 1 week to increase pain-free mobility with all tasks.    Baseline L hip/ LE radicular pain into buttocks improving but remains present   Time 4   Period Weeks   Status Partially Met     PT LONG TERM GOAL #5   Title Pt. able to return to playing 18 holes of golf with no increase c/o low back pain/ radicular symptoms.     Baseline has not returned to golf at this time   Time [place] - 11-04-16 2458    Clinical Impression Statement Pt. continues to present with no tenderness with L lumbar/piriformis palpation in prone position.  Pt. remains significantly limited with B hamstring flexibility/ neural glides (L SLR 30 deg. pain limited).  Pt. able to demonstrate upper body/ trunk rotn. with less pain but moderate joint/ muscle stiffness in low back.  Good B LE muscle strength.  Significant thoracic/ lumbar hypomobility noted during manual tx.  No pain with PA mobs. or STM.  Pt. has not been able to return to golfing at this time.  Pt. will benefit from continued PT to progress flexibility/ pain-free mobility and return to golf without limitations.     Rehab Potential Good   PT Frequency 2x / week   PT Duration 4 weeks   PT Treatment/Interventions ADLs/Self Care Home Management;Cryotherapy;Electrical Stimulation;Moist Heat;Gait training;Functional mobility training;Therapeutic activities;Therapeutic exercise;Patient/family education;Neuromuscular re-education;Manual techniques;Passive range of motion;Energy conservation   PT Next Visit Plan Progress lumbar mobility, LE mobility. Return to golfing (pt. hoping to return to golfing after trip to [facility] in mid-Dec.).     PT Home Exercise Plan See handouts   Consulted and Agree with Plan of Care Patient      Patient will benefit from skilled therapeutic intervention in order to improve the following deficits and impairments:  Decreased activity tolerance, Decreased endurance, Decreased range of motion, Decreased strength, Hypomobility, Pain, Improper body mechanics, Postural dysfunction, Increased muscle spasms, Impaired flexibility  Visit Diagnosis: Acute bilateral low back pain with left-sided sciatica  Joint stiffness  Muscle weakness (generalized)  Abnormal posture       G-Codes - 2016/11/04 1005    Functional Assessment Tool Used Clinical impression/ MODI/ pain/ joint stiffness/ muscle weakness   Functional  Limitation Changing and maintaining body position   Changing and Maintaining Body Position Current Status (K9983) At least 20 percent but less than 40 percent impaired, limited or restricted   Changing and Maintaining Body Position Goal Status (J8250) At least 1 percent but less than 20 percent impaired, limited or restricted      Problem List There are no active problems to display for this patient.  Pura Spice, PT, DPT # [phone] 2016-11-04, 10:06 AM  [facility] [address] [place], [place], [address] Phone: [phone]   Fax:  [phone]  Name: SAGAN MASELLI MRN: [id number] Date of Birth: 04-11-1941

## 2016-10-19 ENCOUNTER — Ambulatory Visit: Payer: Medicare Other | Attending: Orthopedic Surgery | Admitting: Physical Therapy

## 2016-10-19 DIAGNOSIS — R293 Abnormal posture: Secondary | ICD-10-CM | POA: Diagnosis present

## 2016-10-19 DIAGNOSIS — M6281 Muscle weakness (generalized): Secondary | ICD-10-CM

## 2016-10-19 DIAGNOSIS — M5442 Lumbago with sciatica, left side: Secondary | ICD-10-CM | POA: Diagnosis present

## 2016-10-19 DIAGNOSIS — M2560 Stiffness of unspecified joint, not elsewhere classified: Secondary | ICD-10-CM | POA: Insufficient documentation

## 2016-10-20 NOTE — Therapy (Signed)
[facility] [address] [place], [place], [address] Phone: [phone]   Fax:  [phone]  Physical Therapy Treatment  Patient Details  Name: Sean Hubbard MRN: [id number] Date of Birth: 1941/03/18 Referring Provider: Carlynn Spry, PA-C  Encounter Date: 10/19/2016      PT End of Session - 10/19/16 0832    Visit Number 8   Number of Visits 14   Date for PT Re-Evaluation 11/11/16   Authorization - Visit Number 8   Authorization - Number of Visits 16   PT Start Time 0809   PT Stop Time 0901   PT Time Calculation (min) 52 min   Activity Tolerance Patient tolerated treatment well   Behavior During Therapy [facility] for tasks assessed/performed      Past Medical History:  Diagnosis Date  . COPD (chronic obstructive pulmonary disease) ([place])   . Gout   . Hyperlipemia   . Hypertension     No past surgical history on file.  There were no vitals filed for this visit.      Subjective Assessment - 10/19/16 0820    Subjective Pt. traveled to [place] for past 2 days for business and reports increase L hip soreness/pain from a lot of walking.  Pt. states he is doing better this morning than he expected.     Limitations Standing;Lifting;Sitting;Walking;House hold activities;Other (comment)   Patient Stated Goals Decrease LBP/ return to golf   Currently in Pain? No/denies      Objective:  Therapeutic Exercise: SciFit 10 minutes Level 7(warm up/no charge).Standing lumbar AROM (all planes) as tolerated with mirror feedback (cuing to avoid knee flexion with lumbar flexion).  Standing hamstring/ partial lunges/ gastroc stretches at 1st/2nd step of stairs 5x each.  Supine lumbar rotation with gentle overpressureto pt tolerance 5x each.  Reviewed HEP.  Manual tx: Supine stretching program including: Hamstring stretch distal/proximal 30 sec x2 R/L. Sciatic nerve flossing with knee ext to pt tolerance x10 5 sec holds. Piriformis stretch 30  sec x2 R/L. Knees to chest 30 sec x2 R/L. LAD R/L 3 bouts, 20 sec.  Prone passive accessory to CPA/UPA R/L T10-L3 (Grade III, 2 bouts, 30 sec); pt with moderate hypomobility.   Pt response for medical necessity: Pt with overall good tolerance of physical therapy session. He is able to perform aerobic exercise on SciFit with no c/o of pain. He continues to be limited in L LE mobility secondary to onset of sciatic pain. Will benefit from cont emphasis on lumbar/LE mobility to decrease stiffness and increase tolerance to physical activity.       PT Long Term Goals - 10/14/16 1003      PT LONG TERM GOAL #1   Title Pt. I with HEP to increase L hamstring flexibility to >60 deg. to improve pain-free mobility/ return to playing golf.     Baseline L hamstring 35 deg. (+) SLR.   Time 4   Period Weeks   Status Not Met     PT LONG TERM GOAL #2   Title Pt. will decrease Modified Oswestry to <18% to improve self-perceived disability.     Baseline MODI on 11/30  (12%)- marked improvement since initial evaluation   Time 4   Period Weeks   Status Achieved     PT LONG TERM GOAL #3   Title Pt. able to demonstrate 50% increase in lumbar flexion to improve pain-free mobility/ return to playing golf.     Baseline Significant lumbar/ hamstring stiffness  with standing flexion.     Time 4   Period Weeks   Status Not Met     PT LONG TERM GOAL #4   Title Pt. will report no L LE radicular symptoms over 1 week to increase pain-free mobility with all tasks.    Baseline L hip/ LE radicular pain into buttocks improving but remains present   Time 4   Period Weeks   Status Partially Met     PT LONG TERM GOAL #5   Title Pt. able to return to playing 18 holes of golf with no increase c/o low back pain/ radicular symptoms.     Baseline has not returned to golf at this time   Time 4   Period Weeks   Status On-going               Plan - 10/20/16 0956    Clinical Impression Statement Primary issue of  L hip/hamstring muscle tightness limits progress to ROM/ pain-free mobility in clinic.  No pain in L hip with ther.ex. but moderate hypomobility in L hip/lumbar spine noted during manual tx.  No trigger points noted today.  Pt. is moving better on mat table and with transfers with less overall discomfort reported.     Rehab Potential Good   PT Frequency 2x / week   PT Duration 4 weeks   PT Treatment/Interventions ADLs/Self Care Home Management;Cryotherapy;Electrical Stimulation;Moist Heat;Gait training;Functional mobility training;Therapeutic activities;Therapeutic exercise;Patient/family education;Neuromuscular re-education;Manual techniques;Passive range of motion;Energy conservation   PT Next Visit Plan Progress lumbar mobility, LE mobility. Return to golfing (pt. hoping to return to golfing after trip to [facility] in mid-Dec.).     PT Home Exercise Plan See handouts   Consulted and Agree with Plan of Care Patient      Patient will benefit from skilled therapeutic intervention in order to improve the following deficits and impairments:  Decreased activity tolerance, Decreased endurance, Decreased range of motion, Decreased strength, Hypomobility, Pain, Improper body mechanics, Postural dysfunction, Increased muscle spasms, Impaired flexibility  Visit Diagnosis: Acute bilateral low back pain with left-sided sciatica  Joint stiffness  Muscle weakness (generalized)  Abnormal posture     Problem List There are no active problems to display for this patient.  Pura Spice, PT, DPT # [phone] 10/20/2016, 10:01 AM  [place] [facility] [address] [place], [place], [address] Phone: [phone]   Fax:  [phone]  Name: Sean Hubbard MRN: [id number] Date of Birth: August 10, 1941

## 2016-10-21 ENCOUNTER — Ambulatory Visit: Payer: Medicare Other | Admitting: Physical Therapy

## 2016-10-21 DIAGNOSIS — M5442 Lumbago with sciatica, left side: Secondary | ICD-10-CM | POA: Diagnosis not present

## 2016-10-21 DIAGNOSIS — M2560 Stiffness of unspecified joint, not elsewhere classified: Secondary | ICD-10-CM

## 2016-10-21 DIAGNOSIS — R293 Abnormal posture: Secondary | ICD-10-CM

## 2016-10-21 DIAGNOSIS — M6281 Muscle weakness (generalized): Secondary | ICD-10-CM

## 2016-10-21 NOTE — Therapy (Signed)
[facility] [address] [place], [place], [address] Phone: [phone]   Fax:  [phone]  Physical Therapy Treatment  Patient Details  Name: Sean Hubbard MRN: [id number] Date of Birth: 1941-05-14 Referring Provider: Carlynn Spry, PA-C  Encounter Date: 10/21/2016      PT End of Session - 10/21/16 0824    Visit Number 9   Number of Visits 14   Date for PT Re-Evaluation 11/11/16   Authorization - Visit Number 9   Authorization - Number of Visits 16   PT Start Time 0814   PT Stop Time 0905   PT Time Calculation (min) 51 min   Activity Tolerance Patient tolerated treatment well   Behavior During Therapy [facility] for tasks assessed/performed      Past Medical History:  Diagnosis Date  . COPD (chronic obstructive pulmonary disease) ([place])   . Gout   . Hyperlipemia   . Hypertension     History reviewed. No pertinent surgical history.  There were no vitals filed for this visit.      Subjective Assessment - 10/21/16 0819    Subjective Pt. states "doing better".  Pt. staying more active at home/ during work-related hours.  Pts. primary complaint is stiffness in L back/hip/hamstring.     Limitations Standing;Lifting;Sitting;Walking;House hold activities;Other (comment)   Patient Stated Goals Decrease LBP/ return to golf   Currently in Pain? No/denies      Objective:  Therapeutic Exercise: SciFit 10 minutes Level 7(warm up/no charge).Standing lumbar AROM (all planes) as tolerated with mirror feedback (cuing to avoid knee flexion with lumbar flexion).  Standing trunk rotn. With golf club/ Supine lumbar rotation with gentle overpressureto pt tolerance 5x each.  Modified golf swing instruction.  See new HEP (open book/ thoracic ext./ hamstring stretches). Manual tx: Supine stretching program including: Hamstring stretch distal/proximal 30 sec x2 R/L. Sciatic nerve flossing with knee ext to pt tolerance x10 5 sec holds.  Piriformis stretch 30 sec x2 R/L. Knees to chest 30 sec x2 R/L. LAD R/L 3 bouts, 20 sec. Prone passive accessory to CPA/UPA R/L T10-L3 (Grade III, 2 bouts, 30 sec); pt with moderate hypomobility.  STM To lumbar/ sup. Glut. Musculature.     Pt response for medical necessity: Pt with overall good tolerance of physical therapy session. He is able to perform aerobic exercise on SciFit with no c/o of pain. He continues to be limited in L LE mobility secondary to onset of sciatic pain. Will benefit from cont emphasis on lumbar/LE mobility to decrease stiffness and increase tolerance to physical activity.       PT Long Term Goals - 10/14/16 1003      PT LONG TERM GOAL #1   Title Pt. I with HEP to increase L hamstring flexibility to >60 deg. to improve pain-free mobility/ return to playing golf.     Baseline L hamstring 35 deg. (+) SLR.   Time 4   Period Weeks   Status Not Met     PT LONG TERM GOAL #2   Title Pt. will decrease Modified Oswestry to <18% to improve self-perceived disability.     Baseline MODI on 11/30  (12%)- marked improvement since initial evaluation   Time 4   Period Weeks   Status Achieved     PT LONG TERM GOAL #3   Title Pt. able to demonstrate 50% increase in lumbar flexion to improve pain-free mobility/ return to playing golf.     Baseline Significant lumbar/ hamstring  stiffness with standing flexion.     Time 4   Period Weeks   Status Not Met     PT LONG TERM GOAL #4   Title Pt. will report no L LE radicular symptoms over 1 week to increase pain-free mobility with all tasks.    Baseline L hip/ LE radicular pain into buttocks improving but remains present   Time 4   Period Weeks   Status Partially Met     PT LONG TERM GOAL #5   Title Pt. able to return to playing 18 holes of golf with no increase c/o low back pain/ radicular symptoms.     Baseline has not returned to golf at this time   Time 4   Period Weeks   Status On-going            Plan -  10/21/16 6734    Clinical Impression Statement Significant L hamstring/ hip/ back tightness remains.  Pt. demonstrates golf swing with no pain but limited trunk rotn. with increase UE compensatory movement pattern.     Rehab Potential Good   PT Frequency 2x / week   PT Duration 4 weeks   PT Treatment/Interventions ADLs/Self Care Home Management;Cryotherapy;Electrical Stimulation;Moist Heat;Gait training;Functional mobility training;Therapeutic activities;Therapeutic exercise;Patient/family education;Neuromuscular re-education;Manual techniques;Passive range of motion;Energy conservation   PT Next Visit Plan Progress lumbar mobility, LE mobility. Return to golfing (pt. hoping to return to golfing after trip to [facility] in mid-Dec.).     PT Home Exercise Plan See handouts   Consulted and Agree with Plan of Care Patient      Patient will benefit from skilled therapeutic intervention in order to improve the following deficits and impairments:  Decreased activity tolerance, Decreased endurance, Decreased range of motion, Decreased strength, Hypomobility, Pain, Improper body mechanics, Postural dysfunction, Increased muscle spasms, Impaired flexibility  Visit Diagnosis: Acute bilateral low back pain with left-sided sciatica  Joint stiffness  Muscle weakness (generalized)  Abnormal posture     Problem List There are no active problems to display for this patient.  Pura Spice, PT, DPT # [phone] 10/22/2016, 1:18 PM  [place] [facility] [address] [place], [place], [address] Phone: [phone]   Fax:  [phone]  Name: Sean Hubbard MRN: [id number] Date of Birth: December 29, 1940

## 2016-10-22 ENCOUNTER — Encounter: Payer: Self-pay | Admitting: Physical Therapy

## 2016-10-26 ENCOUNTER — Encounter: Payer: Medicare Other | Admitting: Physical Therapy

## 2016-10-28 ENCOUNTER — Ambulatory Visit: Payer: Medicare Other | Admitting: Physical Therapy

## 2016-10-28 DIAGNOSIS — M5442 Lumbago with sciatica, left side: Secondary | ICD-10-CM | POA: Diagnosis not present

## 2016-10-28 DIAGNOSIS — M2560 Stiffness of unspecified joint, not elsewhere classified: Secondary | ICD-10-CM

## 2016-10-28 DIAGNOSIS — M6281 Muscle weakness (generalized): Secondary | ICD-10-CM

## 2016-10-28 DIAGNOSIS — R293 Abnormal posture: Secondary | ICD-10-CM

## 2016-10-28 NOTE — Therapy (Addendum)
[facility] [address]. [place], [place], [address] Phone: [phone]   Fax:  [phone]  Physical Therapy Treatment  Patient Details  Name: Sean Hubbard MRN: [id number] Date of Birth: [birth year] Referring Provider: Carlynn Spry, PA-C  Encounter Date: 10/28/2016      PT End of Session - 10/28/16 1213    Visit Number 10   Number of Visits 14   Date for PT Re-Evaluation 11/11/16   Authorization - Visit Number 10   Authorization - Number of Visits 16   PT Start Time 0812   PT Stop Time 0902   PT Time Calculation (min) 50 min   Activity Tolerance Patient tolerated treatment well   Behavior During Therapy [facility] for tasks assessed/performed      Past Medical History:  Diagnosis Date  . COPD (chronic obstructive pulmonary disease) ([place])   . Gout   . Hyperlipemia   . Hypertension     No past surgical history on file.  There were no vitals filed for this visit.      Subjective Assessment - 10/28/16 1212    Subjective Pt. states back is really stiff from drive to [facility] and back.  Pt. concerned about R shoulder discomfort/ signs of impingement/ tear.     Limitations Standing;Lifting;Sitting;Walking;House hold activities;Other (comment)   Patient Stated Goals Decrease LBP/ return to golf   Currently in Pain? No/denies       Objective:  Therapeutic Exercise: SciFit 10 minutes Level 7(warm up/no charge).Standing lumbar AROM (all planes) as tolerated. Standing trunk rotn. With golf club/ Supine lumbar rotation with gentle overpressureto pt tolerance 5x each.  Reviewed new HEP (open book/ thoracic ext./ hamstring stretches).  Supine TrA muscle contraction during manual tx. Manual tx: Supine stretching program including: Hamstring stretch distal/proximal 30 sec x2 R/L. Sciatic nerve flossing with knee ext to pt tolerance x10 5 sec holds. Piriformis stretch 30 sec x2 R/L. Knees to chest 30 sec x2 R/L. LAD R/L 3 bouts, 20  sec. Prone passive accessory to CPA/UPA R/L T10-L3 (Grade III, 2 bouts, 30 sec); pt with moderate hypomobility.  STM To lumbar/ sup. Glut. Musculature.     Pt response for medical necessity: Pt with overall good tolerance of physical therapy session. He is able to perform aerobic exercise on SciFit with no c/o of pain. He continues to be limited in L LE mobility secondary to onset of sciatic pain. Will benefit from cont emphasis on lumbar/LE mobility to decrease stiffness and increase tolerance to physical activity.     (+) R shoulder impingment noted during [employer] special testing.  Pt. continues to present with significant thoracic/lumbar vertebrae and muscle tightness during manual tx.  PT focused on hamstring/ lumbar flexibility and instructed pt to continue with HEP several times/day.         PT Long Term Goals - 10/14/16 1003      PT LONG TERM GOAL #1   Title Pt. I with HEP to increase L hamstring flexibility to >60 deg. to improve pain-free mobility/ return to playing golf.     Baseline L hamstring 35 deg. (+) SLR.   Time 4   Period Weeks   Status Not Met     PT LONG TERM GOAL #2   Title Pt. will decrease Modified Oswestry to <18% to improve self-perceived disability.     Baseline MODI on 11/30  (12%)- marked improvement since initial evaluation   Time 4   Period Weeks  Status Achieved     PT LONG TERM GOAL #3   Title Pt. able to demonstrate 50% increase in lumbar flexion to improve pain-free mobility/ return to playing golf.     Baseline Significant lumbar/ hamstring stiffness with standing flexion.     Time 4   Period Weeks   Status Not Met     PT LONG TERM GOAL #4   Title Pt. will report no L LE radicular symptoms over 1 week to increase pain-free mobility with all tasks.    Baseline L hip/ LE radicular pain into buttocks improving but remains present   Time 4   Period Weeks   Status Partially Met     PT LONG TERM GOAL #5   Title Pt. able to return to  playing 18 holes of golf with no increase c/o low back pain/ radicular symptoms.     Baseline has not returned to golf at this time   Time 4   Period Weeks   Status On-going               Plan - 10/28/16 1213    Rehab Potential Good   PT Frequency 2x / week   PT Duration 4 weeks   PT Treatment/Interventions ADLs/Self Care Home Management;Cryotherapy;Electrical Stimulation;Moist Heat;Gait training;Functional mobility training;Therapeutic activities;Therapeutic exercise;Patient/family education;Neuromuscular re-education;Manual techniques;Passive range of motion;Energy conservation   PT Next Visit Plan Progress lumbar mobility, LE mobility. Return to golfing (pt. hoping to return to golfing after trip to [place] in mid-Dec.).  Discuss TRIP      Patient will benefit from skilled therapeutic intervention in order to improve the following deficits and impairments:  Decreased activity tolerance, Decreased endurance, Decreased range of motion, Decreased strength, Hypomobility, Pain, Improper body mechanics, Postural dysfunction, Increased muscle spasms, Impaired flexibility  Visit Diagnosis: Acute bilateral low back pain with left-sided sciatica  Joint stiffness  Muscle weakness (generalized)  Abnormal posture     Problem List There are no active problems to display for this patient.  Pura Spice, PT, DPT # [id number] 10/28/2016, 12:14 PM  [facility] [address] [place], [place], [address] Phone: [phone]   Fax:  [phone]  Name: Sean Hubbard MRN: [id number] Date of Birth: 1941/08/10

## 2016-11-02 ENCOUNTER — Ambulatory Visit: Payer: Medicare Other | Admitting: Physical Therapy

## 2016-11-02 ENCOUNTER — Encounter: Payer: Self-pay | Admitting: Physical Therapy

## 2016-11-02 DIAGNOSIS — M5442 Lumbago with sciatica, left side: Secondary | ICD-10-CM | POA: Diagnosis not present

## 2016-11-02 DIAGNOSIS — M6281 Muscle weakness (generalized): Secondary | ICD-10-CM

## 2016-11-02 DIAGNOSIS — M2560 Stiffness of unspecified joint, not elsewhere classified: Secondary | ICD-10-CM

## 2016-11-02 DIAGNOSIS — R293 Abnormal posture: Secondary | ICD-10-CM

## 2016-11-03 NOTE — Therapy (Signed)
[facility] [address] [place], [place], [address] Phone: [phone]   Fax:  [phone]  Physical Therapy Treatment  Patient Details  Name: Sean Hubbard MRN: [id number] Date of Birth: 10-May-1941 Referring Provider: Carlynn Spry, PA-C  Encounter Date: 11/02/2016      PT End of Session - 11/02/16 0729    Visit Number 11   Number of Visits 14   Date for PT Re-Evaluation 11/11/16   Authorization - Visit Number 11   Authorization - Number of Visits 16   PT Start Time 0723   PT Stop Time 0825   PT Time Calculation (min) 62 min   Activity Tolerance Patient tolerated treatment well   Behavior During Therapy [facility] for tasks assessed/performed      Past Medical History:  Diagnosis Date  . COPD (chronic obstructive pulmonary disease) ([place])   . Gout   . Hyperlipemia   . Hypertension     History reviewed. No pertinent surgical history.  There were no vitals filed for this visit.      Subjective Assessment - 11/02/16 0726    Subjective Pt. reports increase muscle soreness in low back after working in yard for past 2 days getting garden ready/ leveled for delivery of hot tub.     Limitations Standing;Lifting;Sitting;Walking;House hold activities;Other (comment)   Patient Stated Goals Decrease LBP/ return to golf   Currently in Pain? No/denies   Pain Descriptors / Indicators Sore     No c/o R shoulder at this time.    Objective:  Therapeutic Exercise: SciFit 10 minutes Level 7(warm up/no charge)- B quad muscle fatigue/no back pain.Standing lumbar AROM (all planes) as tolerated. Supine TrA muscle contraction: GTB hip abd./ marching/ bridging 10x2 each.  Discussed HEP (limited compliance reported).  Manual tx: Supine stretching program including: Hamstring stretch distal/proximal 30 sec x2 R/L. Sciatic nerve flossing with knee ext to pt tolerance x10 5 sec holds. Piriformis stretch 30 sec x2 R/L. Knees to chest 30 sec x2  R/L. LAD R/L 3 bouts, 20 sec. Prone passive accessory to CPA/UPA R/L T10-L3 (Grade III, 2 bouts, 30 sec); pt with significant hypomobility in thoracic/lumbar spine. STM To lumbar/ sup. Glut. Musculature.   Pt response for medical necessity: Pt with overall good tolerance of physical therapy session.  He continues to be limited in L LE mobility secondary to onset of sciatic pain. Will benefit from cont emphasis on lumbar/LE mobility to decrease stiffness and increase tolerance to physical activity.        PT Long Term Goals - 10/14/16 1003      PT LONG TERM GOAL #1   Title Pt. I with HEP to increase L hamstring flexibility to >60 deg. to improve pain-free mobility/ return to playing golf.     Baseline L hamstring 35 deg. (+) SLR.   Time 4   Period Weeks   Status Not Met     PT LONG TERM GOAL #2   Title Pt. will decrease Modified Oswestry to <18% to improve self-perceived disability.     Baseline MODI on 11/30  (12%)- marked improvement since initial evaluation   Time 4   Period Weeks   Status Achieved     PT LONG TERM GOAL #3   Title Pt. able to demonstrate 50% increase in lumbar flexion to improve pain-free mobility/ return to playing golf.     Baseline Significant lumbar/ hamstring stiffness with standing flexion.     Time 4   Period  Weeks   Status Not Met     PT LONG TERM GOAL #4   Title Pt. will report no L LE radicular symptoms over 1 week to increase pain-free mobility with all tasks.    Baseline L hip/ LE radicular pain into buttocks improving but remains present   Time 4   Period Weeks   Status Partially Met     PT LONG TERM GOAL #5   Title Pt. able to return to playing 18 holes of golf with no increase c/o low back pain/ radicular symptoms.     Baseline has not returned to golf at this time   Time 4   Period Weeks   Status On-going            Plan - 11/02/16 0729    Clinical Impression Statement Significant lumbar muscle/vertebrae hypomobility during  mobs. and STM.  No cavitation noted today with lumbar rotation.  No pain in lumbar region during supine core ex. program.  Pt. reports feeling better after manual tx. but carryover has been limited the past couple of weeks.  Pt. returns for 1 more tx. prior to trip to [place] over Christmas.     Rehab Potential Good   PT Frequency 2x / week   PT Duration 4 weeks   PT Treatment/Interventions ADLs/Self Care Home Management;Cryotherapy;Electrical Stimulation;Moist Heat;Gait training;Functional mobility training;Therapeutic activities;Therapeutic exercise;Patient/family education;Neuromuscular re-education;Manual techniques;Passive range of motion;Energy conservation   PT Next Visit Plan Pain-free mobility/ progress core strengthening/ flexibility to promote return to golfing.  Reinforce HEP prior to trip.     PT Home Exercise Plan See handouts   Consulted and Agree with Plan of Care Patient      Patient will benefit from skilled therapeutic intervention in order to improve the following deficits and impairments:  Decreased activity tolerance, Decreased endurance, Decreased range of motion, Decreased strength, Hypomobility, Pain, Improper body mechanics, Postural dysfunction, Increased muscle spasms, Impaired flexibility  Visit Diagnosis: Acute bilateral low back pain with left-sided sciatica  Joint stiffness  Muscle weakness (generalized)  Abnormal posture     Problem List There are no active problems to display for this patient.  Pura Spice, PT, DPT # [id number] 11/03/2016, 10:37 AM  [facility] [address] [place], [place], [address] Phone: [phone]   Fax:  [phone]  Name: Sean Hubbard MRN: [id number] Date of Birth: Dec 05, 1940

## 2016-11-04 ENCOUNTER — Ambulatory Visit: Payer: Medicare Other | Admitting: Physical Therapy

## 2016-11-04 DIAGNOSIS — M6281 Muscle weakness (generalized): Secondary | ICD-10-CM

## 2016-11-04 DIAGNOSIS — M2560 Stiffness of unspecified joint, not elsewhere classified: Secondary | ICD-10-CM

## 2016-11-04 DIAGNOSIS — R293 Abnormal posture: Secondary | ICD-10-CM

## 2016-11-04 DIAGNOSIS — M5442 Lumbago with sciatica, left side: Secondary | ICD-10-CM

## 2016-11-04 NOTE — Therapy (Addendum)
[facility] [address] [place], [place], [address] Phone: [phone]   Fax:  [phone]  Physical Therapy Treatment  Patient Details  Name: Sean Hubbard MRN: [id number] Date of Birth: June 10, 1941 Referring Provider: Carlynn Spry, PA-C  Encounter Date: 11/04/2016      PT End of Session - 11/05/16 1125    Visit Number 12   Number of Visits 14   Date for PT Re-Evaluation 11/11/16   Authorization - Visit Number 12   Authorization - Number of Visits 16   PT Start Time 0814   PT Stop Time 0909   PT Time Calculation (min) 55 min   Activity Tolerance Patient tolerated treatment well   Behavior During Therapy [facility] for tasks assessed/performed      Past Medical History:  Diagnosis Date  . COPD (chronic obstructive pulmonary disease) ([place])   . Gout   . Hyperlipemia   . Hypertension     No past surgical history on file.  There were no vitals filed for this visit.      Subjective Assessment - 11/05/16 1124    Limitations Standing;Lifting;Sitting;Walking;House hold activities;Other (comment)   Patient Stated Goals Decrease LBP/ return to golf   Currently in Pain? No/denies       Pt. states back soreness is better than last tx. but remains stiff.  Pt. has not returned to playing golf at this time and has c/o R shoulder discomfort.    There.ex.: Scifit L6 10 min. B UE/LE (no charge/ warm-up).  Standing lumbar AROM 10x (all planes in pain tolerable range).  Supine TrA marching/ hip abd. With GTB/ bridging 20x (instruction to breath/ maintain TrA muscle contraction).  Golf swing simulation in clinic.   Manual tx.:  Supine LE/lumbar stretches (17 min.).  Prone grade II-III mid-thoracic to lumbar mobs. 2x15 sec. Each.  Supine neural slides to L/R LE 3x each.  Prone deep STM to lumbar/ superior gluteal region.        Pt. remains with significant lumbar joint stiffness/ muscle tightness in thoracic and lumbar region.  Pt. able to  change positions on mat table and in clinic during ther.ex. with less c/o pain and symptoms.  Pt. able to demonstrate a modified golf swing with no increase c/o back pain.        PT Long Term Goals - 10/14/16 1003      PT LONG TERM GOAL #1   Title Pt. I with HEP to increase L hamstring flexibility to >60 deg. to improve pain-free mobility/ return to playing golf.     Baseline L hamstring 35 deg. (+) SLR.   Time 4   Period Weeks   Status Not Met     PT LONG TERM GOAL #2   Title Pt. will decrease Modified Oswestry to <18% to improve self-perceived disability.     Baseline MODI on 11/30  (12%)- marked improvement since initial evaluation   Time 4   Period Weeks   Status Achieved     PT LONG TERM GOAL #3   Title Pt. able to demonstrate 50% increase in lumbar flexion to improve pain-free mobility/ return to playing golf.     Baseline Significant lumbar/ hamstring stiffness with standing flexion.     Time 4   Period Weeks   Status Not Met     PT LONG TERM GOAL #4   Title Pt. will report no L LE radicular symptoms over 1 week to increase pain-free mobility with all  tasks.    Baseline L hip/ LE radicular pain into buttocks improving but remains present   Time 4   Period Weeks   Status Partially Met     PT LONG TERM GOAL #5   Title Pt. able to return to playing 18 holes of golf with no increase c/o low back pain/ radicular symptoms.     Baseline has not returned to golf at this time   Time 4   Period Weeks   Status On-going           Plan - 11/05/16 1125    Rehab Potential Good   PT Frequency 2x / week   PT Duration 4 weeks   PT Treatment/Interventions ADLs/Self Care Home Management;Cryotherapy;Electrical Stimulation;Moist Heat;Gait training;Functional mobility training;Therapeutic activities;Therapeutic exercise;Patient/family education;Neuromuscular re-education;Manual techniques;Passive range of motion;Energy conservation   PT Next Visit Plan Pain-free mobility/ progress  core strengthening/ flexibility to promote return to golfing.        Patient will benefit from skilled therapeutic intervention in order to improve the following deficits and impairments:  Decreased activity tolerance, Decreased endurance, Decreased range of motion, Decreased strength, Hypomobility, Pain, Improper body mechanics, Postural dysfunction, Increased muscle spasms, Impaired flexibility  Visit Diagnosis: Acute bilateral low back pain with left-sided sciatica  Joint stiffness  Muscle weakness (generalized)  Abnormal posture     Problem List There are no active problems to display for this patient.  Pura Spice, PT, DPT # [id number] 11/05/2016, 11:27 AM  [place] [facility] [address] [place], [place], [address] Phone: [phone]   Fax:  [phone]  Name: Sean Hubbard MRN: [id number] Date of Birth: 1941/10/10

## 2016-11-16 ENCOUNTER — Ambulatory Visit: Payer: Medicare Other | Attending: Orthopedic Surgery | Admitting: Physical Therapy

## 2016-11-16 DIAGNOSIS — R293 Abnormal posture: Secondary | ICD-10-CM | POA: Diagnosis present

## 2016-11-16 DIAGNOSIS — M6281 Muscle weakness (generalized): Secondary | ICD-10-CM | POA: Insufficient documentation

## 2016-11-16 DIAGNOSIS — M5442 Lumbago with sciatica, left side: Secondary | ICD-10-CM | POA: Diagnosis present

## 2016-11-16 DIAGNOSIS — M2560 Stiffness of unspecified joint, not elsewhere classified: Secondary | ICD-10-CM | POA: Insufficient documentation

## 2016-11-17 NOTE — Therapy (Signed)
[place] [facility] [address] [place], [place], [address] Phone: [phone]   Fax:  [phone]  Physical Therapy Treatment  Patient Details  Name: PEARLIE LAFOSSE MRN: [id number] Date of Birth: 07-Dec-1940 Referring Provider: Carlynn Spry, PA-C  Encounter Date: 11/16/2016      PT End of Session - 11/17/16 1752    Visit Number 13   Number of Visits 22   Date for PT Re-Evaluation 12/14/16   Authorization - Visit Number 13   Authorization - Number of Visits 22   PT Start Time 0805   PT Stop Time 0901   PT Time Calculation (min) 56 min   Activity Tolerance Patient tolerated treatment well   Behavior During Therapy [facility] for tasks assessed/performed      Past Medical History:  Diagnosis Date  . COPD (chronic obstructive pulmonary disease) ([place])   . Gout   . Hyperlipemia   . Hypertension     No past surgical history on file.  There were no vitals filed for this visit.      Subjective Assessment - 11/17/16 1752    Subjective Pt. reports he is 90% better in low back since starting PT but R shoulder pain continues to persist.     Limitations Standing;Lifting;Sitting;Walking;House hold activities;Other (comment)   Patient Stated Goals Decrease LBP/ return to golf   Currently in Pain? No/denies                                      PT Long Term Goals - 11/17/16 1753      PT LONG TERM GOAL #1   Title Pt. I with HEP to increase L hamstring flexibility to >60 deg. to improve pain-free mobility/ return to playing golf.     Baseline L hamstring 35 deg. (+) SLR.   Time 4   Period Weeks   Status Not Met     PT LONG TERM GOAL #2   Title Pt. will decrease Modified Oswestry to <18% to improve self-perceived disability.     Baseline MODI on 11/30  (12%)- marked improvement since initial evaluation   Time 4   Period Weeks   Status Achieved     PT LONG TERM GOAL #3   Title Pt. able to demonstrate 50%  increase in lumbar flexion to improve pain-free mobility/ return to playing golf.     Baseline Moderate lumbar/ significant hamstring stiffness with standing flexion.     Time 4   Period Weeks   Status Not Met     PT LONG TERM GOAL #4   Title Pt. will report no L LE radicular symptoms over 1 week to increase pain-free mobility with all tasks.    Baseline Minimal to no L lumbar symptoms   Time 4   Period Weeks   Status Partially Met     PT LONG TERM GOAL #5   Title Pt. able to return to playing 18 holes of golf with no increase c/o low back pain/ radicular symptoms.     Baseline has not returned to golf at this time   Time 4   Period Weeks   Status On-going               Plan - 11/17/16 1753    Rehab Potential Good   PT Frequency 2x / week   PT Treatment/Interventions ADLs/Self Care Home Management;Cryotherapy;Electrical Stimulation;Moist Heat;Gait training;Functional  mobility training;Therapeutic activities;Therapeutic exercise;Patient/family education;Neuromuscular re-education;Manual techniques;Passive range of motion;Energy conservation   PT Next Visit Plan Pain-free mobility/ progress core strengthening/ flexibility to promote return to golfing.     PT Home Exercise Plan See handouts   Consulted and Agree with Plan of Care Patient      Patient will benefit from skilled therapeutic intervention in order to improve the following deficits and impairments:  Decreased activity tolerance, Decreased endurance, Decreased range of motion, Decreased strength, Hypomobility, Pain, Improper body mechanics, Postural dysfunction, Increased muscle spasms, Impaired flexibility  Visit Diagnosis: Acute bilateral low back pain with left-sided sciatica  Joint stiffness  Muscle weakness (generalized)  Abnormal posture       G-Codes - 13-Dec-2016 1755    Functional Assessment Tool Used Clinical impression/ MODI/ pain/ joint stiffness/ muscle weakness   Functional Limitation Changing  and maintaining body position   Changing and Maintaining Body Position Current Status (L7373) At least 20 percent but less than 40 percent impaired, limited or restricted   Changing and Maintaining Body Position Goal Status (G6815) At least 1 percent but less than 20 percent impaired, limited or restricted      Problem List There are no active problems to display for this patient.   Pura Spice 11/17/2016, 5:55 PM  [facility] [address] [place], [place], [address] Phone: [phone]   Fax:  [phone]  Name: SHIZUO BISKUP MRN: [id number] Date of Birth: 01/08/1941

## 2016-11-23 ENCOUNTER — Encounter: Payer: Self-pay | Admitting: Physical Therapy

## 2016-11-23 ENCOUNTER — Ambulatory Visit: Payer: Medicare Other | Admitting: Physical Therapy

## 2016-11-23 DIAGNOSIS — M2560 Stiffness of unspecified joint, not elsewhere classified: Secondary | ICD-10-CM

## 2016-11-23 DIAGNOSIS — M5442 Lumbago with sciatica, left side: Secondary | ICD-10-CM | POA: Diagnosis not present

## 2016-11-23 DIAGNOSIS — M6281 Muscle weakness (generalized): Secondary | ICD-10-CM

## 2016-11-23 DIAGNOSIS — R293 Abnormal posture: Secondary | ICD-10-CM

## 2016-11-24 NOTE — Therapy (Signed)
[facility] [facility] [address] [place], [place], [address] Phone: [phone]   Fax:  [phone]  Physical Therapy Treatment  Patient Details  Name: Sean Hubbard MRN: [id number] Date of Birth: Feb 18, 1941 Referring Provider: Carlynn Spry, PA-C  Encounter Date: 11/23/2016    Past Medical History:  Diagnosis Date  . COPD (chronic obstructive pulmonary disease) ([place])   . Gout   . Hyperlipemia   . Hypertension     History reviewed. No pertinent surgical history.  There were no vitals filed for this visit.                                    PT Long Term Goals - 11/17/16 1753      PT LONG TERM GOAL #1   Title Pt. I with HEP to increase L hamstring flexibility to >60 deg. to improve pain-free mobility/ return to playing golf.     Baseline L hamstring 35 deg. (+) SLR.   Time 4   Period Weeks   Status Not Met     PT LONG TERM GOAL #2   Title Pt. will decrease Modified Oswestry to <18% to improve self-perceived disability.     Baseline MODI on 11/30  (12%)- marked improvement since initial evaluation   Time 4   Period Weeks   Status Achieved     PT LONG TERM GOAL #3   Title Pt. able to demonstrate 50% increase in lumbar flexion to improve pain-free mobility/ return to playing golf.     Baseline Moderate lumbar/ significant hamstring stiffness with standing flexion.     Time 4   Period Weeks   Status Not Met     PT LONG TERM GOAL #4   Title Pt. will report no L LE radicular symptoms over 1 week to increase pain-free mobility with all tasks.    Baseline Minimal to no L lumbar symptoms   Time 4   Period Weeks   Status Partially Met     PT LONG TERM GOAL #5   Title Pt. able to return to playing 18 holes of golf with no increase c/o low back pain/ radicular symptoms.     Baseline has not returned to golf at this time   Time 4   Period Weeks   Status On-going             Patient will  benefit from skilled therapeutic intervention in order to improve the following deficits and impairments:  Decreased activity tolerance, Decreased endurance, Decreased range of motion, Decreased strength, Hypomobility, Pain, Improper body mechanics, Postural dysfunction, Increased muscle spasms, Impaired flexibility  Visit Diagnosis: Acute bilateral low back pain with left-sided sciatica  Joint stiffness  Muscle weakness (generalized)  Abnormal posture     Problem List There are no active problems to display for this patient.   Pura Spice 11/24/2016, 5:08 PM  [facility] [address] [place], [place], [address] Phone: [phone]   Fax:  [phone]  Name: Sean Hubbard MRN: [id number] Date of Birth: 12-20-1940

## 2016-11-30 ENCOUNTER — Ambulatory Visit: Payer: Medicare Other | Admitting: Physical Therapy

## 2016-11-30 DIAGNOSIS — M6281 Muscle weakness (generalized): Secondary | ICD-10-CM

## 2016-11-30 DIAGNOSIS — M5442 Lumbago with sciatica, left side: Secondary | ICD-10-CM

## 2016-11-30 DIAGNOSIS — R293 Abnormal posture: Secondary | ICD-10-CM

## 2016-11-30 DIAGNOSIS — M2560 Stiffness of unspecified joint, not elsewhere classified: Secondary | ICD-10-CM

## 2016-12-01 NOTE — Therapy (Signed)
[facility] [address] [place], [place], [address] Phone: [phone]   Fax:  [phone]  Physical Therapy Treatment  Patient Details  Name: VICENT FEBLES MRN: [id number] Date of Birth: 23-Jan-1941 Referring Provider: Carlynn Spry, PA-C  Encounter Date: 11/30/2016      PT End of Session - 12/01/16 1623    Visit Number 15   Number of Visits 22   Date for PT Re-Evaluation 12/14/16   Authorization - Visit Number 15   Authorization - Number of Visits 22   PT Start Time 0728   PT Stop Time 0837   PT Time Calculation (min) 69 min   Activity Tolerance Patient tolerated treatment well;Patient limited by pain   Behavior During Therapy [facility] for tasks assessed/performed      Past Medical History:  Diagnosis Date  . COPD (chronic obstructive pulmonary disease) ([place])   . Gout   . Hyperlipemia   . Hypertension     No past surgical history on file.  There were no vitals filed for this visit.      Subjective Assessment - 12/01/16 1621    Subjective Pt. arrived to PT with new PT order for R shoulder impingment.  Pt. received Cortisone injection in R shoulder yesterday with benefit reported.  Pt. reports no back pain at thsi time.     Limitations Standing;Lifting;Sitting;Walking;House hold activities;Other (comment)   Patient Stated Goals Decrease LBP/ return to golf/ Increase R shoulder ROM/ pain-free mobility.     Currently in Pain? Yes   Pain Score 3    Pain Location Shoulder   Pain Orientation Right                                      PT Long Term Goals - 11/17/16 1753      PT LONG TERM GOAL #1   Title Pt. I with HEP to increase L hamstring flexibility to >60 deg. to improve pain-free mobility/ return to playing golf.     Baseline L hamstring 35 deg. (+) SLR.   Time 4   Period Weeks   Status Not Met     PT LONG TERM GOAL #2   Title Pt. will decrease Modified Oswestry to <18% to improve  self-perceived disability.     Baseline MODI on 11/30  (12%)- marked improvement since initial evaluation   Time 4   Period Weeks   Status Achieved     PT LONG TERM GOAL #3   Title Pt. able to demonstrate 50% increase in lumbar flexion to improve pain-free mobility/ return to playing golf.     Baseline Moderate lumbar/ significant hamstring stiffness with standing flexion.     Time 4   Period Weeks   Status Not Met     PT LONG TERM GOAL #4   Title Pt. will report no L LE radicular symptoms over 1 week to increase pain-free mobility with all tasks.    Baseline Minimal to no L lumbar symptoms   Time 4   Period Weeks   Status Partially Met     PT LONG TERM GOAL #5   Title Pt. able to return to playing 18 holes of golf with no increase c/o low back pain/ radicular symptoms.     Baseline has not returned to golf at this time   Time 4   Period Weeks   Status On-going  Plan - 12/01/16 1624    Rehab Potential Good   PT Frequency 2x / week   PT Duration 4 weeks   PT Treatment/Interventions ADLs/Self Care Home Management;Cryotherapy;Electrical Stimulation;Moist Heat;Gait training;Functional mobility training;Therapeutic activities;Therapeutic exercise;Patient/family education;Neuromuscular re-education;Manual techniques;Passive range of motion;Energy conservation   PT Next Visit Plan Pain-free mobility/ progress core strengthening/ flexibility to promote return to golfing.  ISSUE R shoulder stability ex.    PT Home Exercise Plan See handouts   Consulted and Agree with Plan of Care Patient      Patient will benefit from skilled therapeutic intervention in order to improve the following deficits and impairments:  Decreased activity tolerance, Decreased endurance, Decreased range of motion, Decreased strength, Hypomobility, Pain, Improper body mechanics, Postural dysfunction, Increased muscle spasms, Impaired flexibility  Visit Diagnosis: Acute bilateral low back  pain with left-sided sciatica  Joint stiffness  Muscle weakness (generalized)  Abnormal posture     Problem List There are no active problems to display for this patient.   Pura Spice 12/01/2016, 4:26 PM  [facility] [address] [place], [place], [address] Phone: [phone]   Fax:  [phone]  Name: BERNARDO BRAYMAN MRN: [id number] Date of Birth: 24-Oct-1941

## 2016-12-08 ENCOUNTER — Ambulatory Visit: Payer: Medicare Other | Admitting: Physical Therapy

## 2016-12-08 DIAGNOSIS — M6281 Muscle weakness (generalized): Secondary | ICD-10-CM

## 2016-12-08 DIAGNOSIS — R293 Abnormal posture: Secondary | ICD-10-CM

## 2016-12-08 DIAGNOSIS — M2560 Stiffness of unspecified joint, not elsewhere classified: Secondary | ICD-10-CM

## 2016-12-08 DIAGNOSIS — M5442 Lumbago with sciatica, left side: Secondary | ICD-10-CM

## 2016-12-09 ENCOUNTER — Encounter: Payer: Self-pay | Admitting: Physical Therapy

## 2016-12-09 NOTE — Therapy (Signed)
[facility] [address] [place], [place], [address] Phone: [phone]   Fax:  [phone]  Physical Therapy Treatment  Patient Details  Name: Sean Hubbard MRN: [id number] Date of Birth: 1941-06-02 Referring Provider: Carlynn Spry, PA-C  Encounter Date: 12/08/2016      PT End of Session - 12/08/16 1136    Visit Number 16   Number of Visits 22   Date for PT Re-Evaluation 12/14/16   Authorization - Visit Number 16   Authorization - Number of Visits 22   PT Start Time 8676   PT Stop Time 1139   PT Time Calculation (min) 27 min   Activity Tolerance Patient tolerated treatment well;No increased pain   Behavior During Therapy WFL for tasks assessed/performed      Past Medical History:  Diagnosis Date  . COPD (chronic obstructive pulmonary disease) ([place])   . Gout   . Hyperlipemia   . Hypertension     History reviewed. No pertinent surgical history.  There were no vitals filed for this visit.      Subjective Assessment - 12/08/16 1119    Subjective Pt. presented to PT clinic reporting R shoulder is still doing well following cortisone injection last week. Pt. reports no back pain today.    Limitations Standing;Lifting;Sitting;Walking;House hold activities;Other (comment)   Patient Stated Goals Decrease LBP/ return to golf/ Increase R shoulder ROM/ pain-free mobility.     Currently in Pain? No/denies      SEE HANDOUTS.        PT Education - 12/08/16 1118    Education provided Yes   Education Details HEP for scap. strengthening. see handout   Person(s) Educated Patient   Methods Explanation;Demonstration;Handout   Comprehension Verbalized understanding;Returned demonstration             PT Long Term Goals - 12/09/16 1702      PT LONG TERM GOAL #1   Title Pt. I with HEP to increase L hamstring flexibility to >60 deg. to improve pain-free mobility/ return to playing golf.     Baseline L hamstring 35 deg.  (+) SLR.   Time 4   Period Weeks   Status Not Met     PT LONG TERM GOAL #2   Title Pt. will decrease Modified Oswestry to <18% to improve self-perceived disability.     Baseline MODI on 11/30  (12%)- marked improvement since initial evaluation   Time 4   Period Weeks   Status Achieved     PT LONG TERM GOAL #3   Title Pt. able to demonstrate 50% increase in lumbar flexion to improve pain-free mobility/ return to playing golf.     Baseline Moderate lumbar/ significant hamstring stiffness with standing flexion.  NO PAIN TODAY   Time 4   Period Weeks   Status Not Met     PT LONG TERM GOAL #4   Title Pt. will report no L LE radicular symptoms over 1 week to increase pain-free mobility with all tasks.    Baseline no radicular symptoms.    Time 4   Period Weeks   Status Achieved     PT LONG TERM GOAL #5   Title Pt. able to return to playing 18 holes of golf with no increase c/o low back pain/ radicular symptoms.     Baseline has not returned to golf at this time   Time 4   Period Weeks   Status On-going  Plan - 12/22/16 1137    Clinical Impression Statement Issued HEP with GTB&RTB for R shoulder strengthening program and completed all exercises w/ no increased pain. Pt. instructed to complete home program 3x per week progressing from RTB to GTB as needed, and to return to golf w/ no agg. of pain.    Rehab Potential Good   PT Frequency 2x / week   PT Duration 4 weeks   PT Treatment/Interventions ADLs/Self Care Home Management;Cryotherapy;Electrical Stimulation;Moist Heat;Gait training;Functional mobility training;Therapeutic activities;Therapeutic exercise;Patient/family education;Neuromuscular re-education;Manual techniques;Passive range of motion;Energy conservation   PT Next Visit Plan Discharge   PT Home Exercise Plan See handouts   Consulted and Agree with Plan of Care Patient      Patient will benefit from skilled therapeutic intervention in order to  improve the following deficits and impairments:  Decreased activity tolerance, Decreased endurance, Decreased range of motion, Decreased strength, Hypomobility, Pain, Improper body mechanics, Postural dysfunction, Increased muscle spasms, Impaired flexibility  Visit Diagnosis: Acute bilateral low back pain with left-sided sciatica  Joint stiffness  Muscle weakness (generalized)  Abnormal posture       G-Codes - 12-22-2016 1704    Functional Assessment Tool Used Clinical impression/ MODI/ pain/ joint stiffness/ muscle weakness   Functional Limitation Changing and maintaining body position   Changing and Maintaining Body Position Current Status (R3312) At least 1 percent but less than 20 percent impaired, limited or restricted   Changing and Maintaining Body Position Goal Status (J0871) At least 1 percent but less than 20 percent impaired, limited or restricted   Changing and Maintaining Body Position Discharge Status (X9412) At least 1 percent but less than 20 percent impaired, limited or restricted      Problem List There are no active problems to display for this patient.  Pura Spice, PT, DPT # [id number] 12/09/2016, 5:06 PM  [facility] [address] [place], [place], [address] Phone: [phone]   Fax:  [phone]  Name: Sean Hubbard MRN: [id number] Date of Birth: Aug 04, 1941

## 2016-12-13 ENCOUNTER — Encounter: Payer: Medicare Other | Admitting: Physical Therapy

## 2017-08-19 ENCOUNTER — Other Ambulatory Visit: Payer: Self-pay | Admitting: Orthopedic Surgery

## 2017-08-19 DIAGNOSIS — M7582 Other shoulder lesions, left shoulder: Secondary | ICD-10-CM

## 2017-08-25 ENCOUNTER — Ambulatory Visit
Admission: RE | Admit: 2017-08-25 | Discharge: 2017-08-25 | Disposition: A | Discharge status: Home / Self Care | Payer: Medicare Other | Source: Ambulatory Visit | Attending: Orthopedic Surgery | Admitting: Orthopedic Surgery

## 2017-08-25 DIAGNOSIS — M19012 Primary osteoarthritis, left shoulder: Secondary | ICD-10-CM | POA: Diagnosis not present

## 2017-08-25 DIAGNOSIS — M7581 Other shoulder lesions, right shoulder: Secondary | ICD-10-CM | POA: Insufficient documentation

## 2017-08-25 DIAGNOSIS — M75122 Complete rotator cuff tear or rupture of left shoulder, not specified as traumatic: Secondary | ICD-10-CM | POA: Diagnosis not present

## 2017-08-25 DIAGNOSIS — M898X1 Other specified disorders of bone, shoulder: Secondary | ICD-10-CM | POA: Diagnosis not present

## 2017-08-25 DIAGNOSIS — M7582 Other shoulder lesions, left shoulder: Secondary | ICD-10-CM

## 2017-08-25 DIAGNOSIS — M7552 Bursitis of left shoulder: Secondary | ICD-10-CM | POA: Insufficient documentation

## 2017-10-31 ENCOUNTER — Ambulatory Visit: Payer: Medicare Other | Attending: Orthopedic Surgery | Admitting: Physical Therapy

## 2017-10-31 DIAGNOSIS — M25612 Stiffness of left shoulder, not elsewhere classified: Secondary | ICD-10-CM | POA: Diagnosis present

## 2017-10-31 DIAGNOSIS — M6281 Muscle weakness (generalized): Secondary | ICD-10-CM

## 2017-10-31 DIAGNOSIS — G8929 Other chronic pain: Secondary | ICD-10-CM | POA: Insufficient documentation

## 2017-10-31 DIAGNOSIS — M25512 Pain in left shoulder: Secondary | ICD-10-CM | POA: Insufficient documentation

## 2017-10-31 DIAGNOSIS — Z9889 Other specified postprocedural states: Secondary | ICD-10-CM

## 2017-11-02 ENCOUNTER — Ambulatory Visit: Payer: Medicare Other | Admitting: Physical Therapy

## 2017-11-02 DIAGNOSIS — M25612 Stiffness of left shoulder, not elsewhere classified: Secondary | ICD-10-CM

## 2017-11-02 DIAGNOSIS — M25512 Pain in left shoulder: Secondary | ICD-10-CM

## 2017-11-02 DIAGNOSIS — Z9889 Other specified postprocedural states: Secondary | ICD-10-CM

## 2017-11-02 DIAGNOSIS — M6281 Muscle weakness (generalized): Secondary | ICD-10-CM

## 2017-11-02 DIAGNOSIS — G8929 Other chronic pain: Secondary | ICD-10-CM

## 2017-11-04 ENCOUNTER — Encounter: Payer: Self-pay | Admitting: Physical Therapy

## 2017-11-04 NOTE — Therapy (Signed)
[facility] [address] [place], [place], [address] Phone: [phone]   Fax:  [phone]  Physical Therapy Evaluation  Patient Details  Name: Sean Hubbard MRN: [id number] Date of Birth: Sep 08, 1941 Referring Provider: Vale HavenJoseph Wilson, MD   Encounter Date: 10/31/2017  PT End of Session - 11/04/17 1324    Visit Number  1    Number of Visits  8    Date for PT Re-Evaluation  11/28/17    Authorization - Visit Number  1    Authorization - Number of Visits  10    PT Start Time  1255    PT Stop Time  1356    PT Time Calculation (min)  61 min    Activity Tolerance  Patient tolerated treatment well;No increased pain    Behavior During Therapy  WFL for tasks assessed/performed       Past Medical History:  Diagnosis Date  . COPD (chronic obstructive pulmonary disease) ([facility])   . Gout   . Hyperlipemia   . Hypertension     History reviewed. No pertinent surgical history.  There were no vitals filed for this visit.   Subjective Assessment - 11/04/17 1313    Subjective  Pt. s/p L RTC repair on 10/04/17.  Pt. has been dealing with L rotator cuff tear for >1 year.      Pertinent History  pt. known well to PT clinic.  See previous PMHx.      Limitations  Lifting;House hold activities;Other (comment)    Patient Stated Goals  Increase L shoulder ROM/ strength to improve pain-free mobility and return to playing golf.      Currently in Pain?  No/denies         [facility] PT Assessment - 11/04/17 0001      Assessment   Medical Diagnosis  L RTC repair/ L shoulder pain.     Referring Provider  Vale HavenJoseph Wilson, MD    Onset Date/Surgical Date  10/04/17    Hand Dominance  Right    Prior Therapy  for back not L shoulder      Prior Function   Level of Independence  Independent         Manual:  Supine L shoulder PROM per MD protocol.  STM to L deltoid/ biceps/ triceps region.  Ice to L shoulder after tx.   See HEP.     PT Education -  11/04/17 1323    Education provided  Yes    Education Details  Seated pulley PROM/ pendulum/ ER with wand.  Explained MD protocol.      Person(s) Educated  Patient    Methods  Explanation;Demonstration;Handout    Comprehension  Verbalized understanding;Returned demonstration          PT Long Term Goals - 11/04/17 1336      PT LONG TERM GOAL #1   Title  Pt. I with HEP to increase L shoulder PROM to [facility] as compared to R shoulder to improve progression to resisted ex./ strength training.      Baseline  L shoulder PROM: flexion 90 deg./ abd. 80 deg./ ER 15 deg. (all per MD protocol). Pt. allowed 40 deg.    Time  4    Period  Weeks    Status  New    Target Date  11/28/17      PT LONG TERM GOAL #2   Title  Pt. will reports no L shoulder pain/ tenderness with palpation to improve progression  to isometrics.      Baseline  (+) L shoulder tenderness.     Time  4    Period  Weeks    Status  New    Target Date  11/28/17      PT LONG TERM GOAL #3   Title  Pt. will complete QuickDASH and score <30% to improve functional mobility with UE.      Baseline  TBD    Time  4    Period  Weeks    Status  New    Target Date  11/28/17      PT LONG TERM GOAL #4   Title  Pt. will increase to full L shoulder AROM/ isometrics with no increase c/o pain per MD protocol to promote return to househould/ work-related tasks.     Baseline  No AROM at this time.      Time  4    Period  Weeks    Status  New    Target Date  11/28/17             Plan - 11/04/17 1325    Clinical Impression Statement  Pt. is a pleasant 76 y/o male s/p L RTC repair on 10/04/17.  Pt. reports no pain at rest with use of sling but increase pain with PROM per MD protocol.  Good incision healing with no steristrips in place.  Minimal tenderness with palpation along L deltoid/incisions.  R shoulder AROM WFL.  L shoulder PROM:  flexion 90 deg./ abd. 80 deg./ ER 15 deg. (all per MD protocol).  Pt. allowed 40 deg. L shoulder ER  PROM.  Grip strength: L 42#/ R 61#.  Pt. will benefit from skilled PT services to increase L shoulder AROM/ strenghening per MD protocol to promote return to golf. daily mobility.      Clinical Presentation  Stable    Clinical Decision Making  Moderate    Rehab Potential  Good    PT Frequency  2x / week    PT Duration  4 weeks    PT Treatment/Interventions  ADLs/Self Care Home Management;Cryotherapy;Electrical Stimulation;Moist Heat;Functional mobility training;Therapeutic activities;Therapeutic exercise;Patient/family education;Neuromuscular re-education;Manual techniques;Passive range of motion;Energy conservation    PT Next Visit Plan  Progress L shoulder ROM per MD protocol.      PT Home Exercise Plan  See handouts       Patient will benefit from skilled therapeutic intervention in order to improve the following deficits and impairments:  Decreased activity tolerance, Decreased endurance, Decreased range of motion, Decreased strength, Hypomobility, Pain, Improper body mechanics, Postural dysfunction, Increased muscle spasms, Impaired flexibility, Decreased mobility  Visit Diagnosis: Shoulder joint stiffness, left  S/P left rotator cuff repair  Muscle weakness (generalized)  Chronic left shoulder pain     Problem List There are no active problems to display for this patient.  Cammie McgeeMichael C Karel Turpen, PT, DPT # [id number] 11/04/2017, 1:46 PM  [facility] [address] [place], [place], [address] Phone: [phone]   Fax:  [phone]  Name: Sean Hubbard MRN: [id number] Date of Birth: August 13, 1941

## 2017-11-04 NOTE — Therapy (Addendum)
[facility] [address] [place], [place], [address] Phone: [phone]   Fax:  [phone]  Physical Therapy Treatment  Patient Details  Name: Sean ManlyJoe A Hubbard MRN: [id number] Date of Birth: 1941/04/26 Referring Provider: Vale HavenJoseph Wilson, MD   Encounter Date: 11/02/2017  PT End of Session - 11/04/17 1645    Visit Number  2    Number of Visits  8    Date for PT Re-Evaluation  11/28/17    Authorization - Visit Number  2    Authorization - Number of Visits  10    PT Start Time  1255    PT Stop Time  1349    PT Time Calculation (min)  54 min    Activity Tolerance  Patient tolerated treatment well;No increased pain    Behavior During Therapy  WFL for tasks assessed/performed       Past Medical History:  Diagnosis Date  . COPD (chronic obstructive pulmonary disease) ([facility])   . Gout   . Hyperlipemia   . Hypertension     History reviewed. No pertinent surgical history.  There were no vitals filed for this visit.  Subjective Assessment - 11/04/17 1644    Subjective  Pt. reports 3/10 L shoulder pain currently at rest.  Pt. states he has been compliant with HEP/ MD protocol.      Pertinent History  pt. known well to PT clinic.  See previous PMHx.      Limitations  Lifting;House hold activities;Other (comment)    Patient Stated Goals  Increase L shoulder ROM/ strength to improve pain-free mobility and return to playing golf.      Currently in Pain?  No/denies         [facility] Assessment - 11/04/17 0001      Assessment   Medical Diagnosis  L RTC repair/ L shoulder pain.     Referring Provider  Vale HavenJoseph Wilson, MD    Onset Date/Surgical Date  10/04/17    Hand Dominance  Right    Prior Therapy  for back not L shoulder      Prior Function   Level of Independence  Independent        Treatment:  Manual tx.:  Supine L shoulder AA/PROM (all planes) per MD protocol.  STM to L shoulder/biceps region in supine/ discussed scar  massage.  Therex.: Reviewed HEP/ seated pulley ex./ standing wall ladder flexion 10x./ Seated posture correction ex. With mirror feedback.  Seated L elbow flexion/ supination/ pronation 20x.    Ice to L shoulder in sitting after tx. Session.         Pt. progressing well with L shoulder ROM per MD protoocol.  Minimal c/o L shoulder pain with palpation over anterior deltoid/ incision.  Pt. understands his current limitations and compliant with HEP.  No changes to HEP at this time.      PT Education - 11/04/17 1323    Education provided  Yes    Education Details  Seated pulley PROM/ pendulum/ ER with wand.  Explained MD protocol.      Person(s) Educated  Patient    Methods  Explanation;Demonstration;Handout    Comprehension  Verbalized understanding;Returned demonstration          PT Long Term Goals - 11/04/17 1336      PT LONG TERM GOAL #1   Title  Pt. I with HEP to increase L shoulder PROM to [facility] as compared to R shoulder to improve progression to  resisted ex./ strength training.      Baseline  L shoulder PROM: flexion 90 deg./ abd. 80 deg./ ER 15 deg. (all per MD protocol). Pt. allowed 40 deg.    Time  4    Period  Weeks    Status  New    Target Date  11/28/17      PT LONG TERM GOAL #2   Title  Pt. will reports no L shoulder pain/ tenderness with palpation to improve progression to isometrics.      Baseline  (+) L shoulder tenderness.     Time  4    Period  Weeks    Status  New    Target Date  11/28/17      PT LONG TERM GOAL #3   Title  Pt. will complete QuickDASH and score <30% to improve functional mobility with UE.      Baseline  TBD    Time  4    Period  Weeks    Status  New    Target Date  11/28/17      PT LONG TERM GOAL #4   Title  Pt. will increase to full L shoulder AROM/ isometrics with no increase c/o pain per MD protocol to promote return to househould/ work-related tasks.     Baseline  No AROM at this time.      Time  4    Period  Weeks    Status   New    Target Date  11/28/17            Plan - 11/04/17 1646    Clinical Presentation  Stable    Clinical Decision Making  Moderate    Rehab Potential  Good    PT Frequency  2x / week    PT Duration  4 weeks    PT Treatment/Interventions  ADLs/Self Care Home Management;Cryotherapy;Electrical Stimulation;Moist Heat;Functional mobility training;Therapeutic activities;Therapeutic exercise;Patient/family education;Neuromuscular re-education;Manual techniques;Passive range of motion;Energy conservation    PT Next Visit Plan  Progress L shoulder ROM per MD protocol.      PT Home Exercise Plan  See handouts    Consulted and Agree with Plan of Care  Patient       Patient will benefit from skilled therapeutic intervention in order to improve the following deficits and impairments:  Decreased activity tolerance, Decreased endurance, Decreased range of motion, Decreased strength, Hypomobility, Pain, Improper body mechanics, Postural dysfunction, Increased muscle spasms, Impaired flexibility, Decreased mobility  Visit Diagnosis: S/P left rotator cuff repair  Shoulder joint stiffness, left  Muscle weakness (generalized)  Chronic left shoulder pain     Problem List There are no active problems to display for this patient.  Cammie McgeeMichael C Bentzion Dauria, PT, DPT # [id number] 11/04/2017, 4:49 PM  [place] [facility] [address] [place], [place], [address] Phone: [phone]   Fax:  [phone]  Name: Sean ManlyJoe A Hubbard MRN: [id number] Date of Birth: 03-Nov-1941

## 2017-11-07 ENCOUNTER — Ambulatory Visit: Payer: Medicare Other | Admitting: Physical Therapy

## 2017-11-07 DIAGNOSIS — G8929 Other chronic pain: Secondary | ICD-10-CM

## 2017-11-07 DIAGNOSIS — Z9889 Other specified postprocedural states: Secondary | ICD-10-CM

## 2017-11-07 DIAGNOSIS — M25612 Stiffness of left shoulder, not elsewhere classified: Secondary | ICD-10-CM | POA: Diagnosis not present

## 2017-11-07 DIAGNOSIS — M25512 Pain in left shoulder: Secondary | ICD-10-CM

## 2017-11-07 DIAGNOSIS — M6281 Muscle weakness (generalized): Secondary | ICD-10-CM

## 2017-11-08 ENCOUNTER — Encounter: Payer: Self-pay | Admitting: Physical Therapy

## 2017-11-08 NOTE — Therapy (Signed)
[place] [facility] [address] [place], [place], [address] Phone: [phone]   Fax:  [phone]  Physical Therapy Treatment  Patient Details  Name: Sean Hubbard MRN: [id number] Date of Birth: 1941/06/30 Referring Provider: Vale HavenJoseph Wilson, MD   Encounter Date: 11/07/2017  PT End of Session - 11/08/17 1827    Visit Number  3    Number of Visits  8    Date for PT Re-Evaluation  11/28/17    Authorization - Visit Number  3    Authorization - Number of Visits  10    PT Start Time  1105    PT Stop Time  1159    PT Time Calculation (min)  54 min    Activity Tolerance  Patient tolerated treatment well;No increased pain    Behavior During Therapy  WFL for tasks assessed/performed       Past Medical History:  Diagnosis Date  . COPD (chronic obstructive pulmonary disease) (HCC)   . Gout   . Hyperlipemia   . Hypertension     History reviewed. No pertinent surgical history.  There were no vitals filed for this visit.  Subjective Assessment - 11/08/17 1826    Subjective  Pt. states he is doing well.  Pt. drove to PT today with no issues.      Pertinent History  pt. known well to PT clinic.  See previous PMHx.      Limitations  Lifting;House hold activities;Other (comment)    Patient Stated Goals  Increase L shoulder ROM/ strength to improve pain-free mobility and return to playing golf.      Currently in Pain?  No/denies         Treatment:  Manual tx.:  Supine L shoulder AA/PROM (all planes) per MD protocol.  STM to L shoulder/biceps region in supine/ discussed scar massage.  Therex.: Reviewed HEP/ seated pulley ex./ standing wall ladder flexion 10x./ added scap. Retraction (seated/standing) and posture correction ex. With mirror feedback.  Ice to L shoulder in sitting after tx. Session.     PT Education - 11/08/17 1830    Education provided  Yes    Education Details  Seated/ standing scapular retaction (no resistance).   Posture correction with mirror feedback/ cuing to avoid UT elevation.            PT Long Term Goals - 11/04/17 1336      PT LONG TERM GOAL #1   Title  Pt. I with HEP to increase L shoulder PROM to [facility] as compared to R shoulder to improve progression to resisted ex./ strength training.      Baseline  L shoulder PROM: flexion 90 deg./ abd. 80 deg./ ER 15 deg. (all per MD protocol). Pt. allowed 40 deg.    Time  4    Period  Weeks    Status  New    Target Date  11/28/17      PT LONG TERM GOAL #2   Title  Pt. will reports no L shoulder pain/ tenderness with palpation to improve progression to isometrics.      Baseline  (+) L shoulder tenderness.     Time  4    Period  Weeks    Status  New    Target Date  11/28/17      PT LONG TERM GOAL #3   Title  Pt. will complete QuickDASH and score <30% to improve functional mobility with UE.      Baseline  TBD    Time  4    Period  Weeks    Status  New    Target Date  11/28/17      PT LONG TERM GOAL #4   Title  Pt. will increase to full L shoulder AROM/ isometrics with no increase c/o pain per MD protocol to promote return to househould/ work-related tasks.     Baseline  No AROM at this time.      Time  4    Period  Weeks    Status  New    Target Date  11/28/17            Plan - 11/08/17 1828    Clinical Impression Statement  Pt. progressing well with L shoulder AA/PROM per MD protocol.  Good incision healing with minimal tenderness reported.  Significant thoracic hypomobility noted with addition of scapular retraction/ posture correction ex.  No increase c/o pain after tx. session.      Clinical Presentation  Stable    Clinical Decision Making  Moderate    Rehab Potential  Good    PT Frequency  2x / week    PT Duration  4 weeks    PT Treatment/Interventions  ADLs/Self Care Home Management;Cryotherapy;Electrical Stimulation;Moist Heat;Functional mobility training;Therapeutic activities;Therapeutic exercise;Patient/family  education;Neuromuscular re-education;Manual techniques;Passive range of motion;Energy conservation    PT Next Visit Plan  Progress L shoulder ROM per MD protocol.      PT Home Exercise Plan  See handouts    Consulted and Agree with Plan of Care  Patient       Patient will benefit from skilled therapeutic intervention in order to improve the following deficits and impairments:  Decreased activity tolerance, Decreased endurance, Decreased range of motion, Decreased strength, Hypomobility, Pain, Improper body mechanics, Postural dysfunction, Increased muscle spasms, Impaired flexibility, Decreased mobility  Visit Diagnosis: S/P left rotator cuff repair  Shoulder joint stiffness, left  Muscle weakness (generalized)  Chronic left shoulder pain     Problem List There are no active problems to display for this patient.  Cammie McgeeMichael C Nalin Mazzocco, PT, DPT # [id number] 11/08/2017, 6:35 PM  [place] [facility] [address] [place], [place], [address] Phone: [phone]   Fax:  [phone]  Name: Sean Hubbard MRN: [id number] Date of Birth: 06/08/1941

## 2017-11-09 ENCOUNTER — Ambulatory Visit: Payer: Medicare Other | Admitting: Physical Therapy

## 2017-11-09 ENCOUNTER — Encounter: Payer: Self-pay | Admitting: Physical Therapy

## 2017-11-09 DIAGNOSIS — G8929 Other chronic pain: Secondary | ICD-10-CM

## 2017-11-09 DIAGNOSIS — M25612 Stiffness of left shoulder, not elsewhere classified: Secondary | ICD-10-CM | POA: Diagnosis not present

## 2017-11-09 DIAGNOSIS — M6281 Muscle weakness (generalized): Secondary | ICD-10-CM

## 2017-11-09 DIAGNOSIS — M25512 Pain in left shoulder: Secondary | ICD-10-CM

## 2017-11-09 DIAGNOSIS — Z9889 Other specified postprocedural states: Secondary | ICD-10-CM

## 2017-11-09 NOTE — Therapy (Signed)
[place] [facility] [address] [place], [place], [address] Phone: [phone]   Fax:  [phone]  Physical Therapy Treatment  Patient Details  Name: Sean ManlyJoe A Hubbard MRN: [id number] Date of Birth: [birth year] Referring Provider: Vale HavenJoseph Wilson, MD   Encounter Date: 11/09/2017  PT End of Session - 11/09/17 1252    Visit Number  4    Number of Visits  8    Date for PT Re-Evaluation  11/28/17    Authorization - Visit Number  4    Authorization - Number of Visits  10    PT Start Time  1304    PT Stop Time  1416    PT Time Calculation (min)  72 min    Activity Tolerance  Patient tolerated treatment well;No increased pain    Behavior During Therapy  WFL for tasks assessed/performed       Past Medical History:  Diagnosis Date  . COPD (chronic obstructive pulmonary disease) (HCC)   . Gout   . Hyperlipemia   . Hypertension     History reviewed. No pertinent surgical history.  There were no vitals filed for this visit.  Subjective Assessment - 11/09/17 1252    Subjective  No new complaints.  Pt. weaning off of sling more each day.      Pertinent History  pt. known well to PT clinic.  See previous PMHx.      Limitations  Lifting;House hold activities;Other (comment)    Patient Stated Goals  Increase L shoulder ROM/ strength to improve pain-free mobility and return to playing golf.      Currently in Pain?  Yes    Pain Score  1     Pain Location  Shoulder    Pain Orientation  Left    Pain Descriptors / Indicators  Aching          Treatment:  Manual tx.:  Supine L shoulder AA/PROM (all planes) per MD protocol (27 min.).  STM to L shoulder/biceps region in supine/ scar massage with no lotion.  Therex.: Seated B UBE AA/PROM with no resistance 2 min. F/b.  Standing wall ladder flexion/ abduction (modified) 10x./ standing Scap. Retraction and posture correction ex. With mirror feedback.  Supine wand: press-ups/ serratus punches/  flexion (all with PT assist/ guidance).  Standing wand press outs with PT assist 20x each.    Ice to L shoulder in sitting after tx. Session.      PT Education - 11/08/17 1830    Education provided  Yes    Education Details  Seated/ standing scapular retaction (no resistance).  Posture correction with mirror feedback/ cuing to avoid UT elevation.            PT Long Term Goals - 11/04/17 1336      PT LONG TERM GOAL #1   Title  Pt. I with HEP to increase L shoulder PROM to [facility] as compared to R shoulder to improve progression to resisted ex./ strength training.      Baseline  L shoulder PROM: flexion 90 deg./ abd. 80 deg./ ER 15 deg. (all per MD protocol). Pt. allowed 40 deg.    Time  4    Period  Weeks    Status  New    Target Date  11/28/17      PT LONG TERM GOAL #2   Title  Pt. will reports no L shoulder pain/ tenderness with palpation to improve progression to isometrics.  Baseline  (+) L shoulder tenderness.     Time  4    Period  Weeks    Status  New    Target Date  11/28/17      PT LONG TERM GOAL #3   Title  Pt. will complete QuickDASH and score <30% to improve functional mobility with UE.      Baseline  TBD    Time  4    Period  Weeks    Status  New    Target Date  11/28/17      PT LONG TERM GOAL #4   Title  Pt. will increase to full L shoulder AROM/ isometrics with no increase c/o pain per MD protocol to promote return to househould/ work-related tasks.     Baseline  No AROM at this time.      Time  4    Period  Weeks    Status  New    Target Date  11/28/17         Plan - 11/09/17 1252    Clinical Impression Statement  Pt. progressing to wand AAROM press-ups/ serratus punches and flexion AA/PROM in a pain tolerable range.  Marked increase in L shoulder flexion/abd. to >120 deg. with no significant increase in pain.  Pt. pain remained low t/o tx. session.  Minimal c/o tenderness with palpation along L deltoid/ biceps musculature.  Cuing to prevent any L  UT compensatory movement patterns during AA/PROM.  Pt. allowed to start isometrics at 8 weeks (see MD protocol).      Clinical Presentation  Stable    Clinical Decision Making  Moderate    Rehab Potential  Good    PT Frequency  2x / week    PT Duration  4 weeks    PT Treatment/Interventions  ADLs/Self Care Home Management;Cryotherapy;Electrical Stimulation;Moist Heat;Functional mobility training;Therapeutic activities;Therapeutic exercise;Patient/family education;Neuromuscular re-education;Manual techniques;Passive range of motion;Energy conservation    PT Next Visit Plan  Progress L shoulder ROM per MD protocol.      PT Home Exercise Plan  See handouts    Consulted and Agree with Plan of Care  Patient       Patient will benefit from skilled therapeutic intervention in order to improve the following deficits and impairments:  Decreased activity tolerance, Decreased endurance, Decreased range of motion, Decreased strength, Hypomobility, Pain, Improper body mechanics, Postural dysfunction, Increased muscle spasms, Impaired flexibility, Decreased mobility  Visit Diagnosis: S/P left rotator cuff repair  Shoulder joint stiffness, left  Muscle weakness (generalized)  Chronic left shoulder pain     Problem List There are no active problems to display for this patient.  Sean McgeeMichael C Jahsiah Hubbard, PT, DPT # [id number] 11/09/2017, 2:38 PM  [place] [facility] [address] [place], [place], [address] Phone: [phone]   Fax:  [phone]  Name: Sean ManlyJoe A Hubbard MRN: [id number] Date of Birth: 03-22-1941

## 2017-11-14 ENCOUNTER — Encounter: Payer: Self-pay | Admitting: Physical Therapy

## 2017-11-14 ENCOUNTER — Ambulatory Visit: Payer: Medicare Other | Admitting: Physical Therapy

## 2017-11-14 DIAGNOSIS — M25512 Pain in left shoulder: Secondary | ICD-10-CM

## 2017-11-14 DIAGNOSIS — M25612 Stiffness of left shoulder, not elsewhere classified: Secondary | ICD-10-CM

## 2017-11-14 DIAGNOSIS — Z9889 Other specified postprocedural states: Secondary | ICD-10-CM

## 2017-11-14 DIAGNOSIS — M6281 Muscle weakness (generalized): Secondary | ICD-10-CM

## 2017-11-14 DIAGNOSIS — G8929 Other chronic pain: Secondary | ICD-10-CM

## 2017-11-14 NOTE — Therapy (Signed)
[place] [facility] [address] [place], [place], [address] Phone: [phone]   Fax:  [phone]  Physical Therapy Treatment  Patient Details  Name: Sean Hubbard MRN: [id number] Date of Birth: [birth year] Referring Provider: Vale HavenJoseph Wilson, MD   Encounter Date: 11/14/2017  PT End of Session - 11/14/17 1253    Visit Number  5    Number of Visits  8    Date for PT Re-Evaluation  11/28/17    Authorization - Visit Number  5    Authorization - Number of Visits  10    PT Start Time  1255    PT Stop Time  1343    PT Time Calculation (min)  48 min    Activity Tolerance  Patient tolerated treatment well;Patient limited by pain    Behavior During Therapy  [facility] for tasks assessed/performed       Past Medical History:  Diagnosis Date  . COPD (chronic obstructive pulmonary disease) (HCC)   . Gout   . Hyperlipemia   . Hypertension     History reviewed. No pertinent surgical history.  There were no vitals filed for this visit.  Subjective Assessment - 11/14/17 1253    Subjective  Pt. states he is sore in L shoulder today.  Pt. did HEP this morning and went fishing yesterday.      Pertinent History  pt. known well to PT clinic.  See previous PMHx.      Limitations  Lifting;House hold activities;Other (comment)    Patient Stated Goals  Increase L shoulder ROM/ strength to improve pain-free mobility and return to playing golf.      Currently in Pain?  Yes    Pain Score  2     Pain Location  Shoulder    Pain Orientation  Left    Pain Descriptors / Indicators  Aching          Treatment:   Therex.: Seated B UBE AA/PROM with no resistance 2 min. F/b.  Standing wall ladder flexion/ abduction (modified) 10x./ standing Scap. Retraction and posture correction ex. With mirror feedback.  Standing wand (PROM): flexion/ biceps/ ER/ abd./press-outs (all with PT assist/ guidance).  Standing L shoulder pendulums with 3# wt. (CW/CCW).  Standing scap.  Retraction with cuing for proper posture/ head position.  Gentle L bicep muscle manual isometrics in supine 5x.    Manual tx.: Supine L shoulder AA/PROM (all planes) per MD protocol (23 min.). STM to L shoulder/biceps region in supine/ scar massage with no lotion.   Ice to L shoulder in sitting after tx. Session.     PT Long Term Goals - 11/04/17 1336      PT LONG TERM GOAL #1   Title  Pt. I with HEP to increase L shoulder PROM to [facility] as compared to R shoulder to improve progression to resisted ex./ strength training.      Baseline  L shoulder PROM: flexion 90 deg./ abd. 80 deg./ ER 15 deg. (all per MD protocol). Pt. allowed 40 deg.    Time  4    Period  Weeks    Status  New    Target Date  11/28/17      PT LONG TERM GOAL #2   Title  Pt. will reports no L shoulder pain/ tenderness with palpation to improve progression to isometrics.      Baseline  (+) L shoulder tenderness.     Time  4    Period  Weeks    Status  New    Target Date  11/28/17      PT LONG TERM GOAL #3   Title  Pt. will complete QuickDASH and score <30% to improve functional mobility with UE.      Baseline  TBD    Time  4    Period  Weeks    Status  New    Target Date  11/28/17      PT LONG TERM GOAL #4   Title  Pt. will increase to full L shoulder AROM/ isometrics with no increase c/o pain per MD protocol to promote return to househould/ work-related tasks.     Baseline  No AROM at this time.      Time  4    Period  Weeks    Status  New    Target Date  11/28/17            Plan - 11/14/17 1253    Clinical Impression Statement  Pt. really stiff in L shoulder with initial PROM of shoulder in all positions with moderate increase in L shoulder ROM as tx. progressed.  L shoulder ER remains limited to <20 deg. with some compensatory strategies noted.  Good incision healing with no reported tenderness noted.  Pt. will continue to progress per MD protocol with stretching and isometrics ex. at side by  8 weeks.  Good scap. retraction/ head posture correction with mirror feedback.      Clinical Presentation  Stable    Clinical Decision Making  Moderate    Rehab Potential  Good    PT Frequency  2x / week    PT Duration  4 weeks    PT Treatment/Interventions  ADLs/Self Care Home Management;Cryotherapy;Electrical Stimulation;Moist Heat;Functional mobility training;Therapeutic activities;Therapeutic exercise;Patient/family education;Neuromuscular re-education;Manual techniques;Passive range of motion;Energy conservation    PT Next Visit Plan  Progress L shoulder ROM per MD protocol.      PT Home Exercise Plan  See handouts    Consulted and Agree with Plan of Care  Patient       Patient will benefit from skilled therapeutic intervention in order to improve the following deficits and impairments:  Decreased activity tolerance, Decreased endurance, Decreased range of motion, Decreased strength, Hypomobility, Pain, Improper body mechanics, Postural dysfunction, Increased muscle spasms, Impaired flexibility, Decreased mobility  Visit Diagnosis: S/P left rotator cuff repair  Shoulder joint stiffness, left  Muscle weakness (generalized)  Chronic left shoulder pain     Problem List There are no active problems to display for this patient.  Cammie McgeeMichael C Eron Goble, PT, DPT # [id number] 11/14/2017, 4:03 PM   [facility] [address] [place], [place], [address] Phone: [phone]   Fax:  [phone]  Name: Sean Hubbard MRN: [id number] Date of Birth: Mar 17, 1941

## 2017-11-16 ENCOUNTER — Encounter: Payer: Medicare Other | Admitting: Physical Therapy

## 2017-11-18 ENCOUNTER — Encounter: Payer: Self-pay | Admitting: Physical Therapy

## 2017-11-18 ENCOUNTER — Ambulatory Visit: Payer: Medicare Other | Attending: Orthopedic Surgery | Admitting: Physical Therapy

## 2017-11-18 DIAGNOSIS — M6281 Muscle weakness (generalized): Secondary | ICD-10-CM | POA: Diagnosis present

## 2017-11-18 DIAGNOSIS — M25512 Pain in left shoulder: Secondary | ICD-10-CM | POA: Insufficient documentation

## 2017-11-18 DIAGNOSIS — M25612 Stiffness of left shoulder, not elsewhere classified: Secondary | ICD-10-CM | POA: Diagnosis present

## 2017-11-18 DIAGNOSIS — G8929 Other chronic pain: Secondary | ICD-10-CM | POA: Diagnosis present

## 2017-11-18 DIAGNOSIS — Z9889 Other specified postprocedural states: Secondary | ICD-10-CM | POA: Diagnosis not present

## 2017-11-18 NOTE — Therapy (Signed)
[place] [facility] [address] [place], [place], [address] Phone: [phone]   Fax:  [phone]  Physical Therapy Treatment  Patient Details  Name: ULICE FOLLETT MRN: [id number] Date of Birth: 30-Nov-1940 Referring Provider: Vale Haven, MD   Encounter Date: 11/18/2017  PT End of Session - 11/18/17 1306    Visit Number  6    Number of Visits  8    Date for PT Re-Evaluation  11/28/17    Authorization - Visit Number  6    Authorization - Number of Visits  10    PT Start Time  1249    PT Stop Time  1345    PT Time Calculation (min)  56 min    Activity Tolerance  Patient tolerated treatment well;Patient limited by pain    Behavior During Therapy  [facility] for tasks assessed/performed       Past Medical History:  Diagnosis Date  . COPD (chronic obstructive pulmonary disease) (HCC)   . Gout   . Hyperlipemia   . Hypertension     History reviewed. No pertinent surgical history.  There were no vitals filed for this visit.  Subjective Assessment - 11/18/17 1305    Subjective  MD f/u on 11/28/17 (Monday).  Pt. wore sling to bed last night and felt better in L shoulder.  No pain reported at this time.      Pertinent History  pt. known well to PT clinic.  See previous PMHx.      Limitations  Lifting;House hold activities;Other (comment)    Patient Stated Goals  Increase L shoulder ROM/ strength to improve pain-free mobility and return to playing golf.      Currently in Pain?  No/denies         Treatment:   Therex.:SeatedB UBE AA/PROM with no resistance 2 min. F/b. Sanding Scap. Retraction and posture correction ex. With mirror feedback.Standing wand (AA/PROM): flexion/ biceps/ ER/ abd./ ext. press-outs (all with PT assist/ guidance). Standing L shoulder pendulums (CW/CCW).  Supine serratus punches 10x with PT cuing.  Standing gentle L sh. Isometrics (flex./ ext./ abd.) at doorway (see HEP handouts).  Standing ball up/down wall with  PT assist on L 6x (difficulty).      Manual tx.: Supine L shoulder AA/PROM (all planes) per MD protocol(24 min.). STM to L shoulder/biceps region in supine/ scar massagewith no lotion.   Ice to L shoulder in sitting after tx. Session.    PT Long Term Goals - 11/04/17 1336      PT LONG TERM GOAL #1   Title  Pt. I with HEP to increase L shoulder PROM to [facility] as compared to R shoulder to improve progression to resisted ex./ strength training.      Baseline  L shoulder PROM: flexion 90 deg./ abd. 80 deg./ ER 15 deg. (all per MD protocol). Pt. allowed 40 deg.    Time  4    Period  Weeks    Status  New    Target Date  11/28/17      PT LONG TERM GOAL #2   Title  Pt. will reports no L shoulder pain/ tenderness with palpation to improve progression to isometrics.      Baseline  (+) L shoulder tenderness.     Time  4    Period  Weeks    Status  New    Target Date  11/28/17      PT LONG TERM GOAL #3   Title  Pt. will complete QuickDASH and score <30% to improve functional mobility with UE.      Baseline  TBD    Time  4    Period  Weeks    Status  New    Target Date  11/28/17      PT LONG TERM GOAL #4   Title  Pt. will increase to full L shoulder AROM/ isometrics with no increase c/o pain per MD protocol to promote return to househould/ work-related tasks.     Baseline  No AROM at this time.      Time  4    Period  Weeks    Status  New    Target Date  11/28/17            Plan - 11/18/17 1345    Clinical Impression Statement  Pt. has <45 deg. L shoulder flexion AROM in standing posture.  L UT compensatory pattern with standing/ seated wand ex.  Moderate B UT muscle tightness noted.  PT added light isometrics with proper technique at doorframe and supine serratus punches.  Pt. instructed to avoid any pain provoking tasks and reviewed MD protocol.      Clinical Presentation  Stable    Clinical Decision Making  Moderate    Rehab Potential  Good    PT Frequency  2x /  week    PT Duration  4 weeks    PT Treatment/Interventions  ADLs/Self Care Home Management;Cryotherapy;Electrical Stimulation;Moist Heat;Functional mobility training;Therapeutic activities;Therapeutic exercise;Patient/family education;Neuromuscular re-education;Manual techniques;Passive range of motion;Energy conservation    PT Next Visit Plan  Progress L shoulder ROM per MD protocol.      PT Home Exercise Plan  See handouts    Consulted and Agree with Plan of Care  Patient       Patient will benefit from skilled therapeutic intervention in order to improve the following deficits and impairments:  Decreased activity tolerance, Decreased endurance, Decreased range of motion, Decreased strength, Hypomobility, Pain, Improper body mechanics, Postural dysfunction, Increased muscle spasms, Impaired flexibility, Decreased mobility  Visit Diagnosis: S/P left rotator cuff repair  Shoulder joint stiffness, left  Muscle weakness (generalized)  Chronic left shoulder pain     Problem List There are no active problems to display for this patient.  Cammie McgeeMichael C Calen Geister, PT, DPT # [id number] 11/18/2017, 8:32 PM  [place] [facility] [address] [place], [place], [address] Phone: [phone]   Fax:  [phone]  Name: Eulis ManlyJoe A Utsey MRN: [id number] Date of Birth: 1941/06/04

## 2017-11-21 ENCOUNTER — Ambulatory Visit: Payer: Medicare Other | Admitting: Physical Therapy

## 2017-11-21 ENCOUNTER — Encounter: Payer: Self-pay | Admitting: Physical Therapy

## 2017-11-21 DIAGNOSIS — M25612 Stiffness of left shoulder, not elsewhere classified: Secondary | ICD-10-CM

## 2017-11-21 DIAGNOSIS — G8929 Other chronic pain: Secondary | ICD-10-CM

## 2017-11-21 DIAGNOSIS — Z9889 Other specified postprocedural states: Secondary | ICD-10-CM

## 2017-11-21 DIAGNOSIS — M25512 Pain in left shoulder: Secondary | ICD-10-CM

## 2017-11-21 DIAGNOSIS — M6281 Muscle weakness (generalized): Secondary | ICD-10-CM

## 2017-11-21 NOTE — Therapy (Signed)
[facility] [address] [place], [place], [address] Phone: [phone]   Fax:  [phone]  Physical Therapy Treatment  Patient Details  Name: Sean ManlyJoe A Hubbard MRN: [id number] Date of Birth: 1940/12/08 Referring Provider: Vale HavenJoseph Wilson, MD   Encounter Date: 11/21/2017    Treatment: 7 of 8.  Recert date: 11/28/17   Past Medical History:  Diagnosis Date  . COPD (chronic obstructive pulmonary disease) (HCC)   . Gout   . Hyperlipemia   . Hypertension     History reviewed. No pertinent surgical history.  There were no vitals filed for this visit.    Pt. reports no pain in L shoulder at this time. Pt. slept on L shoulder last night and woke up with increase discomfort.       Treatment:   Therex.:SeatedB UBE AA/PROM with no resistance 3 min. F/b.  Standing Scap. Retraction and posture correction ex. With mirror feedback.Standingwand (AA/PROM): flexion/ ER/ abd./ ext. press-outs(all with PT assist/ guidance). Standing gentle L sh. Isometrics (flex./ ext./ abd.) at doorway (reviewed HEP handouts).  RTB scap. Retraction/ bicep curls 20x.  Standing ball up/down wall with PT assist on L 6x (difficulty).     Manual tx.: Supine L shoulder AA/PROM (all planes) per MD protocol(2119min.). STM to L shoulder/biceps region in supine/ scar massagewith no lotion.   Ice to L shoulder in sitting after tx. Session.     L shoulder A/AROM remains limited in seated/standing posture secondary to muscle weakness/ pain. Pt. progressing well with supine ROM ex. program. No increase pain reported after tx. session. Pt. independent with scapular/ pec./ sh. HEP. Good incision healing.     PT Long Term Goals - 11/04/17 1336      PT LONG TERM GOAL #1   Title  Pt. I with HEP to increase L shoulder PR[facility] to [facility] as compared to R shoulder to improve progression to resisted ex./ strength training.      Baseline  L shoulder PROM: flexion 90  deg./ abd. 80 deg./ ER 15 deg. (all per MD protocol). Pt. allowed 40 deg.    Time  4    Period  Weeks    Status  New    Target Date  11/28/17      PT LONG TERM GOAL #2   Title  Pt. will reports no L shoulder pain/ tenderness with palpation to improve progression to isometrics.      Baseline  (+) L shoulder tenderness.     Time  4    Period  Weeks    Status  New    Target Date  11/28/17      PT LONG TERM GOAL #3   Title  Pt. will complete QuickDASH and score <30% to improve functional mobility with UE.      Baseline  TBD    Time  4    Period  Weeks    Status  New    Target Date  11/28/17      PT LONG TERM GOAL #4   Title  Pt. will increase to full L shoulder AROM/ isometrics with no increase c/o pain per MD protocol to promote return to househould/ work-related tasks.     Baseline  No AROM at this time.      Time  4    Period  Weeks    Status  New    Target Date  11/28/17  Patient will benefit from skilled therapeutic intervention in order to improve the following deficits and impairments:  Decreased activity tolerance, Decreased endurance, Decreased range of motion, Decreased strength, Hypomobility, Pain, Improper body mechanics, Postural dysfunction, Increased muscle spasms, Impaired flexibility, Decreased mobility  Visit Diagnosis: S/P left rotator cuff repair  Shoulder joint stiffness, left  Muscle weakness (generalized)  Chronic left shoulder pain     Problem List There are no active problems to display for this patient.  Cammie Mcgee, PT, DPT # [id number] 11/26/2017, 11:22 AM  [facility] [address] [place], [place], [address] Phone: [phone]   Fax:  [phone]  Name: Sean Hubbard MRN: [id number] Date of Birth: 08/11/1941

## 2017-11-23 ENCOUNTER — Ambulatory Visit: Payer: Medicare Other | Admitting: Physical Therapy

## 2017-11-23 ENCOUNTER — Encounter: Payer: Self-pay | Admitting: Physical Therapy

## 2017-11-23 DIAGNOSIS — M25612 Stiffness of left shoulder, not elsewhere classified: Secondary | ICD-10-CM

## 2017-11-23 DIAGNOSIS — M6281 Muscle weakness (generalized): Secondary | ICD-10-CM

## 2017-11-23 DIAGNOSIS — Z9889 Other specified postprocedural states: Secondary | ICD-10-CM

## 2017-11-23 DIAGNOSIS — G8929 Other chronic pain: Secondary | ICD-10-CM

## 2017-11-23 DIAGNOSIS — M25512 Pain in left shoulder: Secondary | ICD-10-CM

## 2017-11-23 NOTE — Therapy (Signed)
[place] [facility] [address] [place], [place], [address] Phone: [phone]   Fax:  [phone]  Physical Therapy Treatment  Patient Details  Name: Sean Hubbard MRN: [id number] Date of Birth: 1941-06-22 Referring Provider: Vale Haven, MD   Encounter Date: 11/23/2017  PT End of Session - 11/23/17 1655    Visit Number  8    Number of Visits  8    Date for PT Re-Evaluation  11/28/17    PT Start Time  1238    PT Stop Time  1341    PT Time Calculation (min)  63 min    Activity Tolerance  Patient tolerated treatment well;Patient limited by pain    Behavior During Therapy  [facility] for tasks assessed/performed       Past Medical History:  Diagnosis Date  . COPD (chronic obstructive pulmonary disease) (HCC)   . Gout   . Hyperlipemia   . Hypertension     History reviewed. No pertinent surgical history.  There were no vitals filed for this visit.  Subjective Assessment - 11/23/17 1245    Subjective  Pt. states he is doing well.  No new complaints.  Pt. did some fishing yesterday with no issues.  No pain reported at this time.  Good compliance with HEP.      Pertinent History  pt. known well to PT clinic.  See previous PMHx.      Limitations  Lifting;House hold activities;Other (comment)    Patient Stated Goals  Increase L shoulder ROM/ strength to improve pain-free mobility and return to playing golf.      Currently in Pain?  No/denies       Treatment:    There ex.:  UBE 3 min. f/b (no charge/ warm-up) Seated L sh. AAROM scap pushup-plus with wand 20x. Seated L sh. A/AROM flexion, abduction, and external rotation supine isometric flexion, abduction, and external rotation in 45 degree abduction with neutral shoulder position (pain tolerable range) 10 x 5 sec. Supine L sh. Serratus punch with minimal assistance 20x.  Manual tx.:  AAROM and PROM of L shoulder in supine position.   Supine L shoulder AP grade II-III mob. At prox.  Humerus 5x 10 sec.  STM of L shoulder with a focus on upper trap, pec, delt, and biceps (no pain or tenderness reported).    Ice to L shoulder in sitting after tx.    PT Long Term Goals - 11/04/17 1336      PT LONG TERM GOAL #1   Title  Pt. I with HEP to increase L shoulder PROM to [facility] as compared to R shoulder to improve progression to resisted ex./ strength training.      Baseline  L shoulder PROM: flexion 90 deg./ abd. 80 deg./ ER 15 deg. (all per MD protocol). Pt. allowed 40 deg.    Time  4    Period  Weeks    Status  New    Target Date  11/28/17      PT LONG TERM GOAL #2   Title  Pt. will reports no L shoulder pain/ tenderness with palpation to improve progression to isometrics.      Baseline  (+) L shoulder tenderness.     Time  4    Period  Weeks    Status  New    Target Date  11/28/17      PT LONG TERM GOAL #3   Title  Pt. will complete QuickDASH and score <  30% to improve functional mobility with UE.      Baseline  TBD    Time  4    Period  Weeks    Status  New    Target Date  11/28/17      PT LONG TERM GOAL #4   Title  Pt. will increase to full L shoulder AROM/ isometrics with no increase c/o pain per MD protocol to promote return to househould/ work-related tasks.     Baseline  No AROM at this time.      Time  4    Period  Weeks    Status  New    Target Date  11/28/17         Plan - 11/23/17 1656    Clinical Impression Statement  Pt. has continued to progress well with L shoulder ROM/ stability per MD protocol.  Pt. presents with limited L shoulder AROM (supine/ seated): flexion (150/ 51 deg.), abd. (98/ 44 deg.), ER 18 deg., IR 78 deg.  Grip strength: L 47#, R 65#.  Good incision healing with no tenderness with STM.  Pt. will benefit from continued skilled PT services to increase L shoulder AROM and strengthening to promote return to golfing.         Clinical Presentation  Stable    Clinical Decision Making  Low    Rehab Potential  Good    PT Frequency  2x  / week    PT Duration  4 weeks    PT Treatment/Interventions  ADLs/Self Care Home Management;Cryotherapy;Electrical Stimulation;Moist Heat;Functional mobility training;Therapeutic activities;Therapeutic exercise;Patient/family education;Neuromuscular re-education;Manual techniques;Passive range of motion;Energy conservation    PT Next Visit Plan  Progress L shoulder ROM per MD protocol.   Discuss MD f/u.      PT Home Exercise Plan  See handouts    Consulted and Agree with Plan of Care  Patient       Patient will benefit from skilled therapeutic intervention in order to improve the following deficits and impairments:  Decreased activity tolerance, Decreased endurance, Decreased range of motion, Decreased strength, Hypomobility, Pain, Improper body mechanics, Postural dysfunction, Increased muscle spasms, Impaired flexibility, Decreased mobility  Visit Diagnosis: S/P left rotator cuff repair  Shoulder joint stiffness, left  Muscle weakness (generalized)  Chronic left shoulder pain     Problem List There are no active problems to display for this patient.  Cammie McgeeMichael C Delmy Holdren, PT, DPT # [id number] 11/23/2017, 5:12 PM  [facility] [address] [place], [place], [address] Phone: [phone]   Fax:  [phone]  Name: Eulis ManlyJoe A Voris MRN: [id number] Date of Birth: 09-06-1941

## 2017-11-28 ENCOUNTER — Encounter: Payer: Medicare Other | Admitting: Physical Therapy

## 2017-11-29 ENCOUNTER — Encounter: Payer: Self-pay | Admitting: Physical Therapy

## 2017-11-29 ENCOUNTER — Ambulatory Visit: Payer: Medicare Other | Admitting: Physical Therapy

## 2017-11-29 DIAGNOSIS — G8929 Other chronic pain: Secondary | ICD-10-CM

## 2017-11-29 DIAGNOSIS — M6281 Muscle weakness (generalized): Secondary | ICD-10-CM

## 2017-11-29 DIAGNOSIS — M25612 Stiffness of left shoulder, not elsewhere classified: Secondary | ICD-10-CM

## 2017-11-29 DIAGNOSIS — Z9889 Other specified postprocedural states: Secondary | ICD-10-CM

## 2017-11-29 DIAGNOSIS — M25512 Pain in left shoulder: Secondary | ICD-10-CM

## 2017-11-30 ENCOUNTER — Encounter: Payer: Self-pay | Admitting: Physical Therapy

## 2017-11-30 ENCOUNTER — Encounter: Payer: Medicare Other | Admitting: Physical Therapy

## 2017-11-30 ENCOUNTER — Ambulatory Visit: Payer: Medicare Other | Admitting: Physical Therapy

## 2017-11-30 DIAGNOSIS — G8929 Other chronic pain: Secondary | ICD-10-CM

## 2017-11-30 DIAGNOSIS — M6281 Muscle weakness (generalized): Secondary | ICD-10-CM

## 2017-11-30 DIAGNOSIS — M25612 Stiffness of left shoulder, not elsewhere classified: Secondary | ICD-10-CM

## 2017-11-30 DIAGNOSIS — Z9889 Other specified postprocedural states: Secondary | ICD-10-CM

## 2017-11-30 DIAGNOSIS — M25512 Pain in left shoulder: Secondary | ICD-10-CM

## 2017-11-30 NOTE — Therapy (Signed)
[place] [facility] [address] [place], [place], [address] Phone: [phone]   Fax:  [phone]  Physical Therapy Treatment  Patient Details  Name: Sean Hubbard MRN: [id number] Date of Birth: Jul 03, 1941 Referring Provider: Vonna Drafts, MD   Encounter Date: 11/29/2017  PT End of Session - 11/30/17 1356    Visit Number  10    Number of Visits  17    Date for PT Re-Evaluation  12/27/17    PT Start Time  1244    PT Stop Time  1342    PT Time Calculation (min)  58 min    Activity Tolerance  Patient tolerated treatment well;Patient limited by pain    Behavior During Therapy  [facility] for tasks assessed/performed       Past Medical History:  Diagnosis Date  . COPD (chronic obstructive pulmonary disease) ([place])   . Gout   . Hyperlipemia   . Hypertension     History reviewed. No pertinent surgical history.  There were no vitals filed for this visit.  Subjective Assessment - 11/30/17 1344    Subjective  Pt. sore in L shoulder after PT tx. yesterday.  Pt. reports crepitus in L shoulder with shoulder flexion/abduction >70 deg.  Pt. using ice at home to control swelling.      Pertinent History  pt. known well to PT clinic.  See previous PMHx.      Limitations  Lifting;House hold activities;Other (comment)    Patient Stated Goals  Increase L shoulder ROM/ strength to improve pain-free mobility and return to playing golf.      Currently in Pain?  Yes    Pain Score  2     Pain Location  Shoulder    Pain Orientation  Left    Pain Descriptors / Indicators  Aching    Pain Type  Acute pain;Surgical pain         Treatment:    There ex.:  UBE 3 min. f/b (no charge/ warm-up) Standing wall ladder flexion/ abd. 10x each (difficulty, esp. With eccentric control).   Standing L shoulder AAROM with wand (mirror feedback) flexion/ extension/ IR/ER 20x each (PT assist with L shoulder PRN, esp. With eccentric muscle control).   Supine L sh.  isometric flexion, abduction, and external rotation in 45 degree abduction with neutral shoulder position (pain tolerable range) 10 x 5 sec. Supine L sh. Serratus punch with minimal assistance to maintain 90 deg. Sh. flexion 20x. YTB sh. ER (difficulty)/ bicep curls/ tricep extension/ scap. Retraction 20x each.    Manual tx.:  AAROM and PROM of L shoulder in supine position.   Supine L shoulder AP grade II-III mob. At prox. Humerus 5x 10 sec.  STM of L shoulder with a focus on upper trap, pec, delt, and biceps (no pain or tenderness reported).    Ice to L shoulder in sitting after tx.       Pt. continues to progress well and is currently at 8 weeks s/p L shoulder surgery (see MD protocol). Pt. presents with limited L shoulder AROM (supine/ seated): flexion (150/ 51 deg.), abd. (98/ 44 deg.), ER 18 deg., IR 78 deg. Grip strength: L 47#, R 65#. No tenderness with STM over L anterior/posterior sh. in supine position. Pt. will benefit from continued skilled PT services to increase L shoulder AROM and strengthening to promote return to golfing.  PT Education - 11/30/17 1352    Education provided  Yes    Education  Details  See new HEP    Person(s) Educated  Patient    Methods  Demonstration;Explanation;Handout    Comprehension  Verbalized understanding;Returned demonstration          PT Long Term Goals - 11/30/17 1411      PT LONG TERM GOAL #1   Title  Pt. I with HEP to increase L shoulder PROM to [facility] as compared to R shoulder to improve progression to resisted ex./ strength training.      Baseline  Pt. presents with limited L shoulder AROM (supine/ seated): flexion (150/ 51 deg.), abd. (98/ 44 deg.), ER 18 deg., IR 78 deg.  Pain limited PROM    Time  4    Period  Weeks    Status  Partially Met    Target Date  12/27/17      PT LONG TERM GOAL #2   Title  Pt. will reports no L shoulder pain/ tenderness with palpation to improve progression to isometrics.      Baseline  No tenderness  with palpation to L sh. today.      Time  4    Period  Weeks    Status  Achieved    Target Date  11/29/17      PT LONG TERM GOAL #3   Title  Pt. will complete QuickDASH and score <30% to improve functional mobility with UE.      Baseline  QuickDASH: 38.6 on 11/29/17.      Time  4    Period  Weeks    Status  Not Met    Target Date  12/27/17      PT LONG TERM GOAL #4   Title  Pt. will increase to full L shoulder AROM/ isometrics with no increase c/o pain per MD protocol to promote return to househould/ work-related tasks.     Baseline  Pt. presents with limited L shoulder AROM (supine/ seated): flexion (150/ 51 deg.), abd. (98/ 44 deg.), ER 18 deg., IR 78 deg.  Pain limited PROM    Time  4    Period  Weeks    Status  Not Met    Target Date  12/27/17      PT LONG TERM GOAL #5   Title  Pt. able to return to playing 18 holes of golf with no increase c/o low back pain/ radicular symptoms.      Baseline  has not returned to golf at this time    Time  4    Period  Weeks    Status  On-going    Target Date  12/27/17            Plan - 11/30/17 1356    Clinical Presentation  Stable    Clinical Decision Making  Low    Rehab Potential  Good    PT Frequency  2x / week    PT Duration  4 weeks    PT Treatment/Interventions  ADLs/Self Care Home Management;Cryotherapy;Electrical Stimulation;Moist Heat;Functional mobility training;Therapeutic activities;Therapeutic exercise;Patient/family education;Neuromuscular re-education;Manual techniques;Passive range of motion;Energy conservation    PT Next Visit Plan  Progress L shoulder ROM per MD protocol.       PT Home Exercise Plan  See handouts    Consulted and Agree with Plan of Care  Patient       Patient will benefit from skilled therapeutic intervention in order to improve the following deficits and impairments:  Decreased activity tolerance, Decreased endurance, Decreased range of motion, Decreased strength, Hypomobility,  Pain, Improper  body mechanics, Postural dysfunction, Increased muscle spasms, Impaired flexibility, Decreased mobility  Visit Diagnosis: S/P left rotator cuff repair  Shoulder joint stiffness, left  Muscle weakness (generalized)  Chronic left shoulder pain     Problem List There are no active problems to display for this patient.  Pura Spice, PT, DPT # [phone] 11/30/2017, 2:49 PM  [place] [facility] [address] [place], [place], [address] Phone: [phone]   Fax:  [phone]  Name: ALCARIO TINKEY MRN: [id number] Date of Birth: 12/09/1940

## 2017-12-01 NOTE — Therapy (Signed)
[place] [facility] [address] [place], [place], [address] Phone: [phone]   Fax:  [phone]  Physical Therapy Treatment  Patient Details  Name: Sean Hubbard MRN: [id number] Date of Birth: 07-11-1941 Referring Provider: Vonna Drafts, MD   Encounter Date: 11/30/2017  PT End of Session - 11/30/17 1356    Visit Number  10    Number of Visits  17    Date for PT Re-Evaluation  12/27/17    PT Start Time  1244    PT Stop Time  1342    PT Time Calculation (min)  58 min    Activity Tolerance  Patient tolerated treatment well;Patient limited by pain    Behavior During Therapy  [facility] for tasks assessed/performed       Past Medical History:  Diagnosis Date  . COPD (chronic obstructive pulmonary disease) ([place])   . Gout   . Hyperlipemia   . Hypertension     History reviewed. No pertinent surgical history.  There were no vitals filed for this visit.  Subjective Assessment - 11/30/17 1344    Subjective  Pt. sore in L shoulder after PT tx. yesterday.  Pt. reports crepitus in L shoulder with shoulder flexion/abduction >70 deg.  Pt. using ice at home to control swelling.      Pertinent History  pt. known well to PT clinic.  See previous PMHx.      Limitations  Lifting;House hold activities;Other (comment)    Patient Stated Goals  Increase L shoulder ROM/ strength to improve pain-free mobility and return to playing golf.      Currently in Pain?  Yes    Pain Score  2     Pain Location  Shoulder    Pain Orientation  Left    Pain Descriptors / Indicators  Aching    Pain Type  Acute pain;Surgical pain         Treatment:   There ex.:   UBE 78mn.f/b (no charge/ warm-up). Supine L sh. isometric flexion, abduction, and external rotation in 45 degree abduction with neutral shoulder position(pain tolerable range) 10 x 5 sec. SupineL sh. Serratus punchwith minimal assistance to maintain 90 deg. Sh. flexion 20x. See new HEP   Manual  tx.: AAROM and PROM of L shoulder in supine position.  Supine L shoulder AP grade II-III mob. At prox. Humerus 5x 10 sec.  STM of L shoulder with a focus on upper trap, pec, delt, and biceps (no pain or tenderness reported).   Ice to L shoulder in sitting after tx.      PT Education - 11/30/17 1352    Education provided  Yes    Education Details  See new HEP    Person(s) Educated  Patient    Methods  Demonstration;Explanation;Handout    Comprehension  Verbalized understanding;Returned demonstration          PT Long Term Goals - 11/30/17 1411      PT LONG TERM GOAL #1   Title  Pt. I with HEP to increase L shoulder [facility] to R shoulder to improve progression to resisted ex./ strength training.      Baseline  Pt. presents with limited L shoulder AROM (supine/ seated): flexion (150/ 51 deg.), abd. (98/ 44 deg.), ER 18 deg., IR 78 deg.  Pain limited PROM    Time  4    Period  Weeks    Status  Partially Met    Target Date  12/27/17      PT LONG TERM GOAL #2   Title  Pt. will reports no L shoulder pain/ tenderness with palpation to improve progression to isometrics.      Baseline  No tenderness with palpation to L sh. today.      Time  4    Period  Weeks    Status  Achieved    Target Date  11/29/17      PT LONG TERM GOAL #3   Title  Pt. will complete QuickDASH and score <30% to improve functional mobility with UE.      Baseline  QuickDASH: 38.6 on 11/29/17.      Time  4    Period  Weeks    Status  Not Met    Target Date  12/27/17      PT LONG TERM GOAL #4   Title  Pt. will increase to full L shoulder AROM/ isometrics with no increase c/o pain per MD protocol to promote return to househould/ work-related tasks.     Baseline  Pt. presents with limited L shoulder AROM (supine/ seated): flexion (150/ 51 deg.), abd. (98/ 44 deg.), ER 18 deg., IR 78 deg.  Pain limited PROM    Time  4    Period  Weeks    Status  Not Met    Target Date  12/27/17      PT  LONG TERM GOAL #5   Title  Pt. able to return to playing 18 holes of golf with no increase c/o low back pain/ radicular symptoms.      Baseline  has not returned to golf at this time    Time  4    Period  Weeks    Status  On-going    Target Date  12/27/17            Plan - 11/30/17 1356    Clinical Impression Statement  Pt. progressing slowly with light L UE resisted/ isometric ex. program.  Pt. remains sore in L shoulder over past 24 hours and understands importance of ice/rest during a strengthening ex. program.  Pt. unable to raise L shoulder >90 deg. actively in standing posture.     Clinical Presentation  Stable    Clinical Decision Making  Low    Rehab Potential  Good    PT Frequency  2x / week    PT Duration  4 weeks    PT Treatment/Interventions  ADLs/Self Care Home Management;Cryotherapy;Electrical Stimulation;Moist Heat;Functional mobility training;Therapeutic activities;Therapeutic exercise;Patient/family education;Neuromuscular re-education;Manual techniques;Passive range of motion;Energy conservation    PT Next Visit Plan  Progress L shoulder ROM per MD protocol.       PT Home Exercise Plan  See handouts    Consulted and Agree with Plan of Care  Patient       Patient will benefit from skilled therapeutic intervention in order to improve the following deficits and impairments:  Decreased activity tolerance, Decreased endurance, Decreased range of motion, Decreased strength, Hypomobility, Pain, Improper body mechanics, Postural dysfunction, Increased muscle spasms, Impaired flexibility, Decreased mobility  Visit Diagnosis: S/P left rotator cuff repair  Shoulder joint stiffness, left  Muscle weakness (generalized)  Chronic left shoulder pain     Problem List There are no active problems to display for this patient.  Pura Spice, PT, DPT # [phone] 12/01/2017, 10:45 AM  [facility] [address] [place],  [place], [address] Phone: [phone]   Fax:  [phone]  Name: Sean Hubbard MRN: [id number] Date of Birth: October 06, 1941

## 2017-12-03 IMAGING — MR MR SHOULDER*L* W/O CM
5 series · 40 of 40 positions shown · non-contrast
Comparison: None.

CLINICAL DATA: Left shoulder pain for 6 months.  No known injury.

EXAM:
MRI OF THE LEFT SHOULDER WITHOUT CONTRAST
TECHNIQUE: Multiplanar, multisequence MR imaging of the shoulder was performed.
No intravenous contrast was administered.

[Series 3: T2 fat-sat · axial · 4.0mm · 0.59mm/px · z∈[-66,+31]mm · 10 of 23 slices shown (1 of 3)]
[im 1/23]
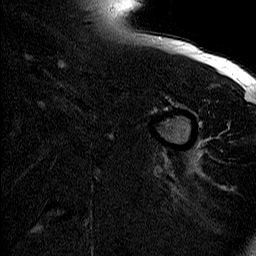
[im 3/23]
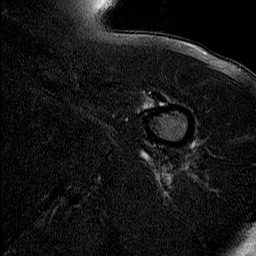
[im 5/23]
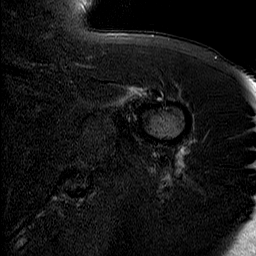
[im 8/23]
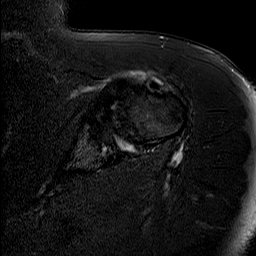
[im 10/23]
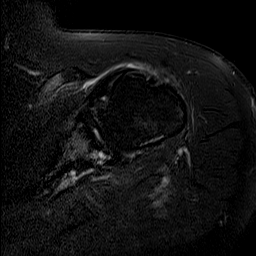
[im 13/23]
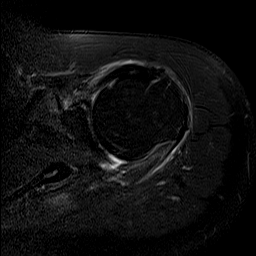
[im 15/23]
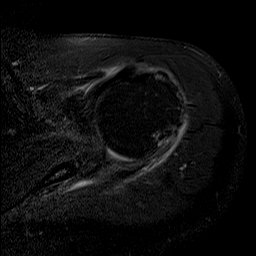
[im 18/23]
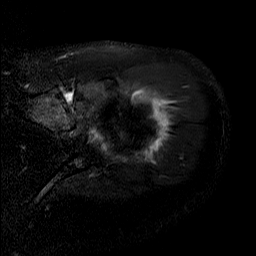
[im 20/23]
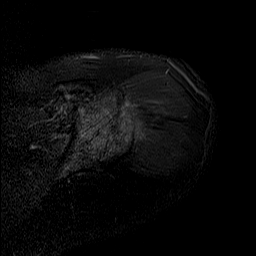
[im 23/23]
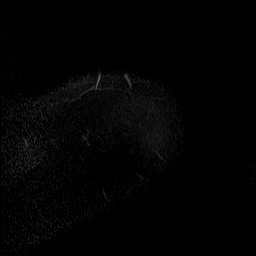

[Series 4: T2 fat-sat · coronal · 4.0mm · 0.59mm/px · 7 of 19 slices shown (2 of 3)]
[im 1/19]
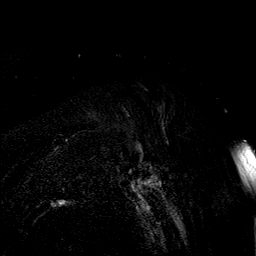
[im 4/19]
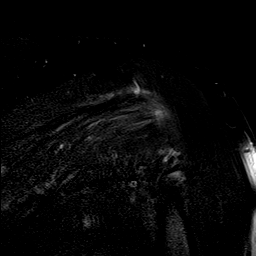
[im 7/19]
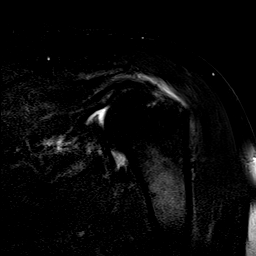
[im 10/19]
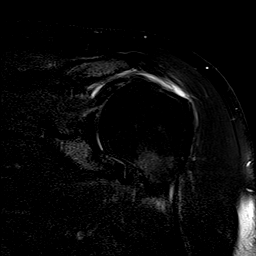
[im 13/19]
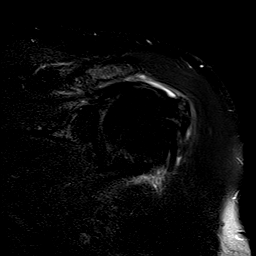
[im 16/19]
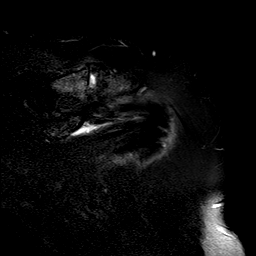
[im 19/19]
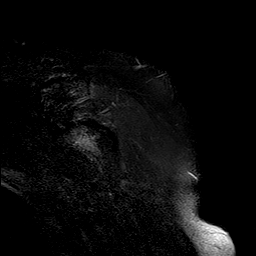

[Series 5: PD · coronal · 4.0mm · 0.59mm/px · 7 of 19 slices shown]
[im 1/19]
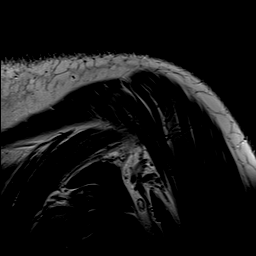
[im 4/19]
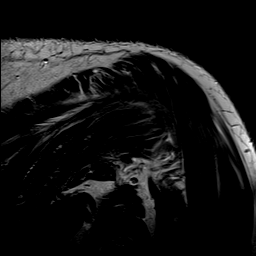
[im 7/19]
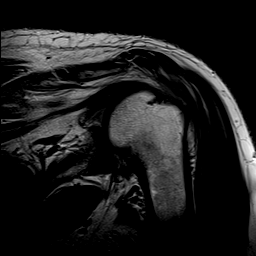
[im 10/19]
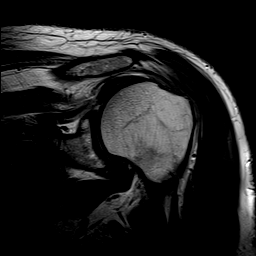
[im 13/19]
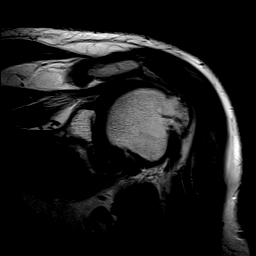
[im 16/19]
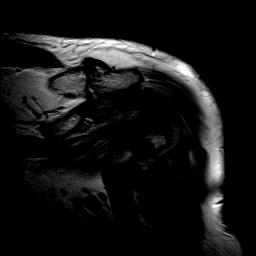
[im 19/19]
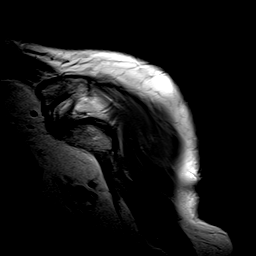

[Series 6: T1 · sagittal · 4.0mm · 0.59mm/px · 8 of 21 slices shown]
[im 1/21]
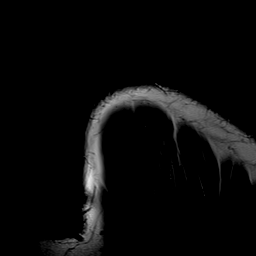
[im 3/21]
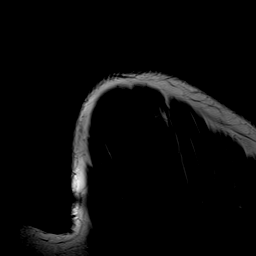
[im 6/21]
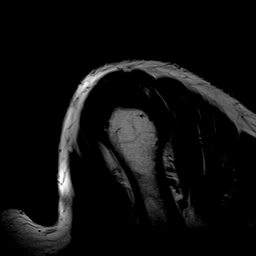
[im 9/21]
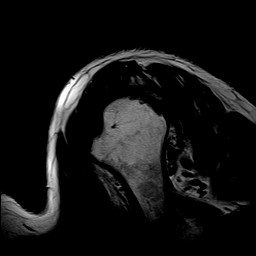
[im 12/21]
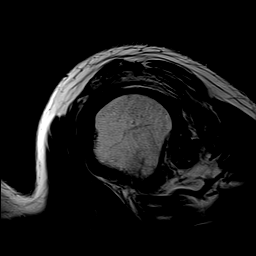
[im 15/21]
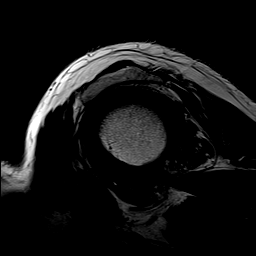
[im 18/21]
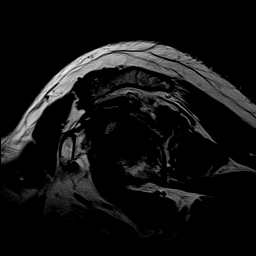
[im 21/21]
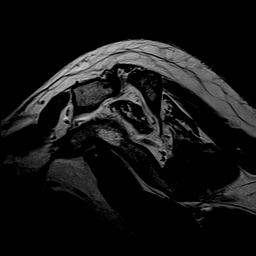

[Series 7: T2 fat-sat · sagittal · 4.0mm · 0.59mm/px · 8 of 21 slices shown (3 of 3)]
[im 1/21]
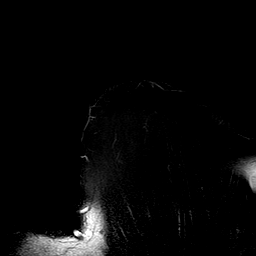
[im 3/21]
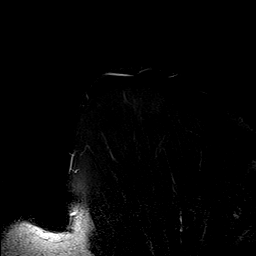
[im 6/21]
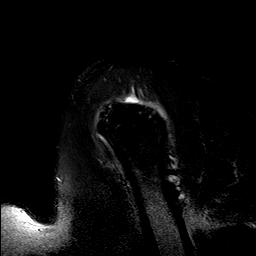
[im 9/21]
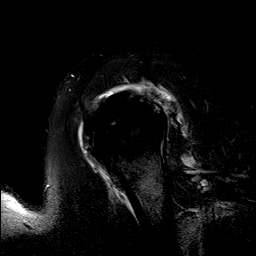
[im 12/21]
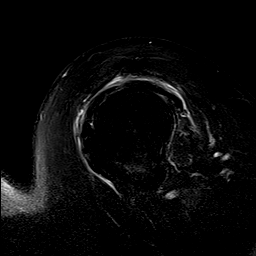
[im 15/21]
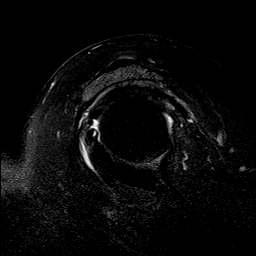
[im 18/21]
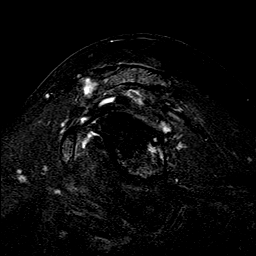
[im 21/21]
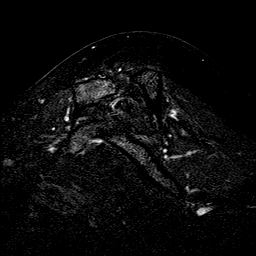

[40 of 40 positions shown; findings below may reference images not displayed]

FINDINGS: Rotator cuff: The patient has a complete supraspinatus tendon tear
and near complete infraspinatus tendon tear. Posterior fibers of the
infraspinatus are intact. Retraction is to the top of the humeral
head, 1-2 cm. The subscapularis and teres minor are intact.

Muscles: All imaged musculature demonstrates mild atrophy. No focal
atrophy or lesion.

Biceps long head: Intact with tendinopathy of the intra-articular
segment noted.

Acromioclavicular Joint: Moderately severe to severe degenerative
change is present. Type II acromion. Fluid is seen in the
subacromial/subdeltoid bursa.

Glenohumeral Joint: Degenerative change is seen with subchondral
cyst formation in the posterior glenoid.

Labrum:  Intact.

Bones:  No fracture or worrisome lesion.

Other: None.
IMPRESSION: Complete supraspinatus and near-complete infraspinatus tendon tears
with 1-2 cm of retraction. All musculature of the shoulder girdle
demonstrates mild atrophy.

Advanced acromioclavicular osteoarthritis. Mild to moderate
glenohumeral osteoarthritis is also present.

Subacromial/subdeltoid bursitis.

## 2017-12-05 ENCOUNTER — Ambulatory Visit: Payer: Medicare Other | Admitting: Physical Therapy

## 2017-12-05 ENCOUNTER — Encounter: Payer: Self-pay | Admitting: Physical Therapy

## 2017-12-05 DIAGNOSIS — Z9889 Other specified postprocedural states: Secondary | ICD-10-CM | POA: Diagnosis not present

## 2017-12-05 DIAGNOSIS — M6281 Muscle weakness (generalized): Secondary | ICD-10-CM

## 2017-12-05 DIAGNOSIS — M25612 Stiffness of left shoulder, not elsewhere classified: Secondary | ICD-10-CM

## 2017-12-05 DIAGNOSIS — G8929 Other chronic pain: Secondary | ICD-10-CM

## 2017-12-05 DIAGNOSIS — M25512 Pain in left shoulder: Secondary | ICD-10-CM

## 2017-12-05 NOTE — Therapy (Signed)
[facility] [address] [place], [place], [address] Phone: [phone]   Fax:  [phone]  Physical Therapy Treatment  Patient Details  Name: Sean Hubbard MRN: [id number] Date of Birth: [birth year] Referring Provider: Vonna Drafts, MD   Encounter Date: 12/05/2017  PT End of Session - 12/05/17 1251    Visit Number  11    Number of Visits  17    Date for PT Re-Evaluation  12/27/17    PT Start Time  3419    PT Stop Time  1410    PT Time Calculation (min)  89 min    Activity Tolerance  Patient tolerated treatment well;Patient limited by pain    Behavior During Therapy  [facility] for tasks assessed/performed       Past Medical History:  Diagnosis Date  . COPD (chronic obstructive pulmonary disease) ([place])   . Gout   . Hyperlipemia   . Hypertension     History reviewed. No pertinent surgical history.  There were no vitals filed for this visit.  Subjective Assessment - 12/05/17 1236    Subjective  Pt. states he probably overdid it this weekend with exercise on L shoulder.         Pertinent History  pt. known well to PT clinic.  See previous PMHx.      Limitations  Lifting;House hold activities;Other (comment)    Patient Stated Goals  Increase L shoulder ROM/ strength to improve pain-free mobility and return to playing golf.      Currently in Pain?  Yes    Pain Score  4     Pain Location  Shoulder    Pain Orientation  Left    Pain Descriptors / Indicators  Aching         Treatment:   There ex.:  Seated pulley flexion/ abd. 20x each (cuing to slow down/ static holds).   UBE 43mn.f/b (no charge/ warm-up). Standing wand: AAROM with PT assist 10x all planes (pain limited). StandingL sh. manualisometrics:  flexion, abduction, and external rotation/ IR (at neutral) 10x 5 sec. Holds. Standing Nautilus: 40# lat. Pull downs with wand (assist with return to flexion)/ 30# triceps 1x10 each.  SupineL sh. Flexion A/AROM 5x  (difficulty).     Manual tx.: AA/PROM of L shoulder in supine position 5x.  Supine L shoulder AP/ inferior/ lateral grade II-III mobs. At prox. Humerus 5x 10 sec.    Ice to L shoulder in sitting after tx.    PT Long Term Goals - 11/30/17 1411      PT LONG TERM GOAL #1   Title  Pt. I with HEP to increase L shoulder [facility] to R shoulder to improve progression to resisted ex./ strength training.      Baseline  Pt. presents with limited L shoulder AROM (supine/ seated): flexion (150/ 51 deg.), abd. (98/ 44 deg.), ER 18 deg., IR 78 deg.  Pain limited PROM    Time  4    Period  Weeks    Status  Partially Met    Target Date  12/27/17      PT LONG TERM GOAL #2   Title  Pt. will reports no L shoulder pain/ tenderness with palpation to improve progression to isometrics.      Baseline  No tenderness with palpation to L sh. today.      Time  4    Period  Weeks    Status  Achieved  Target Date  11/29/17      PT LONG TERM GOAL #3   Title  Pt. will complete QuickDASH and score <30% to improve functional mobility with UE.      Baseline  QuickDASH: 38.6 on 11/29/17.      Time  4    Period  Weeks    Status  Not Met    Target Date  12/27/17      PT LONG TERM GOAL #4   Title  Pt. will increase to full L shoulder AROM/ isometrics with no increase c/o pain per MD protocol to promote return to househould/ work-related tasks.     Baseline  Pt. presents with limited L shoulder AROM (supine/ seated): flexion (150/ 51 deg.), abd. (98/ 44 deg.), ER 18 deg., IR 78 deg.  Pain limited PROM    Time  4    Period  Weeks    Status  Not Met    Target Date  12/27/17      PT LONG TERM GOAL #5   Title  Pt. able to return to playing 18 holes of golf with no increase c/o low back pain/ radicular symptoms.      Baseline  has not returned to golf at this time    Time  4    Period  Weeks    Status  On-going    Target Date  12/27/17         Plan - 12/05/17 1252    Clinical  Impression Statement  Moderate L shoulder pain (with grimacing noted) with initial AA/PROM in standing posture.  Biceps muscle and R lateral trunk lean compensation noted during L sh. flexion/ IR manual isometrics.  L shoulder ER/abduction during isometrics limited by marked muscle weakness and decrease AROM.  No limitations with IR and extension of L shoulder.  L shoulder capsule presents with moderate hypomobility esp. in posterior capsule.        Clinical Presentation  Stable    Clinical Decision Making  Low    Rehab Potential  Good    PT Frequency  2x / week    PT Duration  4 weeks    PT Treatment/Interventions  ADLs/Self Care Home Management;Cryotherapy;Electrical Stimulation;Moist Heat;Functional mobility training;Therapeutic activities;Therapeutic exercise;Patient/family education;Neuromuscular re-education;Manual techniques;Passive range of motion;Energy conservation    PT Next Visit Plan  Progress L shoulder ROM per MD protocol.   Assess anterior capsule/ mobs.      PT Home Exercise Plan  See handouts    Consulted and Agree with Plan of Care  Patient       Patient will benefit from skilled therapeutic intervention in order to improve the following deficits and impairments:  Decreased activity tolerance, Decreased endurance, Decreased range of motion, Decreased strength, Hypomobility, Pain, Improper body mechanics, Postural dysfunction, Increased muscle spasms, Impaired flexibility, Decreased mobility  Visit Diagnosis: S/P left rotator cuff repair  Shoulder joint stiffness, left  Muscle weakness (generalized)  Chronic left shoulder pain     Problem List There are no active problems to display for this patient.  Pura Spice, PT, DPT # [id number] 12/05/2017, 2:19 PM  [facility] [address] [place], [place], [address] Phone: [phone]   Fax:  [phone]  Name: Sean Hubbard MRN: [id number] Date of Birth: 1941/06/26

## 2017-12-07 ENCOUNTER — Encounter: Payer: Medicare Other | Admitting: Physical Therapy

## 2017-12-07 ENCOUNTER — Ambulatory Visit: Payer: Medicare Other

## 2017-12-07 ENCOUNTER — Encounter: Payer: Self-pay | Admitting: Physical Therapy

## 2017-12-07 DIAGNOSIS — Z9889 Other specified postprocedural states: Secondary | ICD-10-CM | POA: Diagnosis not present

## 2017-12-07 NOTE — Therapy (Signed)
[facility] [address] [place], [place], [address] Phone: [phone]   Fax:  [phone]  Physical Therapy Treatment  Patient Details  Name: Sean Hubbard MRN: [id number] Date of Birth: 10/31/1941 Referring Provider: Joseph Wilson, MD   Encounter Date: 12/07/2017  PT End of Session - 12/07/17 1459    Visit Number  12    Number of Visits  17    Date for PT Re-Evaluation  12/27/17    PT Start Time  1300    PT Stop Time  1345    PT Time Calculation (min)  45 min    Activity Tolerance  Patient tolerated treatment well    Behavior During Therapy  WFL for tasks assessed/performed       Past Medical History:  Diagnosis Date  . COPD (chronic obstructive pulmonary disease) ([facility])   . Gout   . Hyperlipemia   . Hypertension     History reviewed. No pertinent surgical history.  There were no vitals filed for this visit.  Subjective Assessment - 12/07/17 1305    Subjective  Pt reports very minimal pain today upon arrival, 1/10 in shoulder. States that he was prescribed an antibiotic recently for a dental procedure. Otherwise no specific questions or concerns. Reports compliance with HEP.     Pertinent History  pt. known well to PT clinic.  See previous PMHx.      Limitations  Lifting;House hold activities;Other (comment)    Patient Stated Goals  Increase L shoulder ROM/ strength to improve pain-free mobility and return to playing golf.      Currently in Pain?  Yes    Pain Score  1     Pain Location  Shoulder    Pain Orientation  Left    Pain Descriptors / Indicators  Aching    Pain Type  Chronic pain    Pain Onset  Today    Pain Frequency  Intermittent         TREATMENT  There ex  Moist heat pack x 10 minutes prior to therapy (no charge); UBE x 3min.f/b (no charge/ warm-up); Seated pulley flexion/ abd. 20x each (cuing to avoid shoulder shrug);  Supine and seated wand AAROM for flexion x 10, cues to avoid shoulder  shrug in sitting; SupineL sh. Flexion AROM x 10; 5x (difficulty).   Supine L sh. manualisometrics:  flexion, abduction, adduction, and external rotation/ IR (at neutral) 10 x 5 sec. holds; Standing Nautilus: 40# lat. Pull downs with wand (assist with return to flexion) 2 x 10; Chest press with light manual resistance on cane x 10;  Serratus punch in supine with gentle manual resistance x 10; Gentle rhythmic stabs at 90 flexion x 30s;  Manual tx. L shoulder PROM in all planes with gentle end range stretch x 10s with each stretch; Supine L shoulder AP mobs, grade II-III mobs at neutral 30s/bout x 3 bouts; Supine L shoulder superior to inferior mobs at 90 scaption, grade II-III mobs at neutral 30s/bout x 3 bouts; Cold pack applied to L shoulder at end of session x 8 minutes (unbilled);                     PT Education - 12/07/17 1459    Education provided  Yes    Education Details  Exercise form technique, minimize shoulder shrug with AAROM    Person(s) Educated  Patient    Methods  Explanation    Comprehension  Verbalized   understanding          PT Long Term Goals - 11/30/17 1411      PT LONG TERM GOAL #1   Title  Pt. I with HEP to increase L shoulder PROM to WFL as compared to R shoulder to improve progression to resisted ex./ strength training.      Baseline  Pt. presents with limited L shoulder AROM (supine/ seated): flexion (150/ 51 deg.), abd. (98/ 44 deg.), ER 18 deg., IR 78 deg.  Pain limited PROM    Time  4    Period  Weeks    Status  Partially Met    Target Date  12/27/17      PT LONG TERM GOAL #2   Title  Pt. will reports no L shoulder pain/ tenderness with palpation to improve progression to isometrics.      Baseline  No tenderness with palpation to L sh. today.      Time  4    Period  Weeks    Status  Achieved    Target Date  11/29/17      PT LONG TERM GOAL #3   Title  Pt. will complete QuickDASH and score <30% to improve functional  mobility with UE.      Baseline  QuickDASH: 38.6 on 11/29/17.      Time  4    Period  Weeks    Status  Not Met    Target Date  12/27/17      PT LONG TERM GOAL #4   Title  Pt. will increase to full L shoulder AROM/ isometrics with no increase c/o pain per MD protocol to promote return to househould/ work-related tasks.     Baseline  Pt. presents with limited L shoulder AROM (supine/ seated): flexion (150/ 51 deg.), abd. (98/ 44 deg.), ER 18 deg., IR 78 deg.  Pain limited PROM    Time  4    Period  Weeks    Status  Not Met    Target Date  12/27/17      PT LONG TERM GOAL #5   Title  Pt. able to return to playing 18 holes of golf with no increase c/o low back pain/ radicular symptoms.      Baseline  has not returned to golf at this time    Time  4    Period  Weeks    Status  On-going    Target Date  12/27/17            Plan - 12/07/17 1459    Clinical Impression Statement  Pt demonstrates significant shoulder shrug with AAROM of L shoulder against gravity. Pt provided verbal feedback and tactile cues to avoid shoulder shrug. Pt with poor capsular mobility during manual therapy. He is progressing well with his strengthening. No new HEP added today. Pt encouraged to continue HEP and follow-up as scheduled.     Rehab Potential  Good    PT Frequency  2x / week    PT Duration  4 weeks    PT Treatment/Interventions  ADLs/Self Care Home Management;Cryotherapy;Electrical Stimulation;Moist Heat;Functional mobility training;Therapeutic activities;Therapeutic exercise;Patient/family education;Neuromuscular re-education;Manual techniques;Passive range of motion;Energy conservation    PT Next Visit Plan  Progress L shoulder ROM per MD protocol.   Assess anterior capsule/ mobs.      PT Home Exercise Plan  See handouts    Consulted and Agree with Plan of Care  Patient       Patient will benefit from   skilled therapeutic intervention in order to improve the following deficits and impairments:   Decreased activity tolerance, Decreased endurance, Decreased range of motion, Decreased strength, Hypomobility, Pain, Improper body mechanics, Postural dysfunction, Increased muscle spasms, Impaired flexibility, Decreased mobility  Visit Diagnosis: S/P left rotator cuff repair     Problem List There are no active problems to display for this patient.  Phillips Grout PT, DPT   Jermany Sundell 12/07/2017, 3:16 PM  [facility] [address] [place], [place], [address] Phone: [phone]   Fax:  [phone]  Name: Sean Hubbard MRN: [id number] Date of Birth: 03/03/1941

## 2017-12-12 ENCOUNTER — Encounter: Payer: Self-pay | Admitting: Physical Therapy

## 2017-12-12 ENCOUNTER — Ambulatory Visit: Payer: Medicare Other

## 2017-12-12 DIAGNOSIS — M25612 Stiffness of left shoulder, not elsewhere classified: Secondary | ICD-10-CM

## 2017-12-12 DIAGNOSIS — M6281 Muscle weakness (generalized): Secondary | ICD-10-CM

## 2017-12-12 DIAGNOSIS — Z9889 Other specified postprocedural states: Secondary | ICD-10-CM

## 2017-12-12 NOTE — Therapy (Signed)
[facility] [address] [place], [place], [address] Phone: [phone]   Fax:  [phone]  Physical Therapy Treatment  Patient Details  Name: Sean Hubbard MRN: [id number] Date of Birth: [birth year] Referring Provider: Vonna Drafts, MD   Encounter Date: 12/12/2017  PT End of Session - 12/12/17 1342    Visit Number  --    Number of Visits  --       Past Medical History:  Diagnosis Date  . COPD (chronic obstructive pulmonary disease) ([place])   . Gout   . Hyperlipemia   . Hypertension     History reviewed. No pertinent surgical history.  There were no vitals filed for this visit.  Subjective Assessment - 12/12/17 1250    Subjective  Pt. states feeling "sore today" possibly due to sleeping on "different bed".  Pt. states no problems with HEP. Pt. remarks seeing hand specialist yesterday and wil be getting a fusion in hand in one month.    Limitations  Lifting;House hold activities;Other (comment)    Patient Stated Goals  Increase L shoulder ROM/ strength to improve pain-free mobility and return to playing golf.      Currently in Pain?  Yes    Pain Score  4     Pain Location  Shoulder    Pain Orientation  Left    Pain Descriptors / Indicators  Operative site guarding    Pain Type  Chronic pain    Pain Onset  More than a month ago    Pain Frequency  Intermittent    Aggravating Factors   shoulder flexion       TREATMENT    Ther-ex  Moist heat pack applied to L shoulder prior to session (unbilled); UE handbike seated 6 min (3 minutes forward/3 minutes backwards) during history (3 minutes unbilled); Standing AAROM with wand flex/abd/ER x 20 each direction, significant verbal and tactile cues required to decrease  Supine AAROM with wand chest press/ER/abd/Flex x multiple bouts in each direction; Supine rhythmic stabilization 1# shoulder flex at 90 degrees, gentle resistance above elbow; Standing Nautilus 30# 2sets x10- lat  pull down and scapular row; Cold pack applied to L shoulder following therapy x 5 minutes (unbilled);     PT Education - 12/13/17 1656    Education provided  Yes    Education Details  Exercise form/technique    Person(s) Educated  Patient    Methods  Explanation    Comprehension  Verbalized understanding          PT Long Term Goals - 11/30/17 1411      PT LONG TERM GOAL #1   Title  Pt. I with HEP to increase L shoulder PROM to [facility] as compared to R shoulder to improve progression to resisted ex./ strength training.      Baseline  Pt. presents with limited L shoulder AROM (supine/ seated): flexion (150/ 51 deg.), abd. (98/ 44 deg.), ER 18 deg., IR 78 deg.  Pain limited PROM    Time  4    Period  Weeks    Status  Partially Met    Target Date  12/27/17      PT LONG TERM GOAL #2   Title  Pt. will reports no L shoulder pain/ tenderness with palpation to improve progression to isometrics.      Baseline  No tenderness with palpation to L sh. today.      Time  4    Period  Weeks  Status  Achieved    Target Date  11/29/17      PT LONG TERM GOAL #3   Title  Pt. will complete QuickDASH and score <30% to improve functional mobility with UE.      Baseline  QuickDASH: 38.6 on 11/29/17.      Time  4    Period  Weeks    Status  Not Met    Target Date  12/27/17      PT LONG TERM GOAL #4   Title  Pt. will increase to full L shoulder AROM/ isometrics with no increase c/o pain per MD protocol to promote return to househould/ work-related tasks.     Baseline  Pt. presents with limited L shoulder AROM (supine/ seated): flexion (150/ 51 deg.), abd. (98/ 44 deg.), ER 18 deg., IR 78 deg.  Pain limited PROM    Time  4    Period  Weeks    Status  Not Met    Target Date  12/27/17      PT LONG TERM GOAL #5   Title  Pt. able to return to playing 18 holes of golf with no increase c/o low back pain/ radicular symptoms.      Baseline  has not returned to golf at this time    Time  4    Period   Weeks    Status  On-going    Target Date  12/27/17            Plan - 12/12/17 1346    Clinical Impression Statement  Pt. still very limited with AROM/PROM/AAROM. Pt. is significantly weak with L shoulder ER/ABD/FLEX. Pt. had difficulty with rhythmic stability (clockwise and counter clockwise) of shoulder in supine shoulder flexed to 90 degrees. Pt encouraged to continue HEP. Will continue to progress manual techniques and strengthening per protocol.     Clinical Presentation  Stable    Clinical Decision Making  Low    Rehab Potential  Good    PT Frequency  2x / week    PT Duration  4 weeks    PT Treatment/Interventions  Aquatic Therapy;Cryotherapy;Moist Heat;Electrical Stimulation;Ultrasound;Neuromuscular re-education;Balance training;Therapeutic exercise;Manual techniques    PT Next Visit Plan  Rhythmic stabilization of shoulder/ scapular depression    PT Home Exercise Plan  See HEP    Consulted and Agree with Plan of Care  Patient       Patient will benefit from skilled therapeutic intervention in order to improve the following deficits and impairments:  Pain, Improper body mechanics, Decreased coordination, Decreased mobility, Decreased scar mobility, Postural dysfunction, Decreased strength, Decreased range of motion, Decreased endurance, Impaired UE functional use, Hypomobility  Visit Diagnosis: S/P left rotator cuff repair  Shoulder joint stiffness, left  Muscle weakness (generalized)     Problem List There are no active problems to display for this patient.  This entire session was performed under direct supervision and direction of a licensed therapist/therapist assistant . I have personally read, edited and approve of the note as written.   Rosario Adie SPT Phillips Grout PT, DPT   Huprich,Jason,  12/13/2017, 5:02 PM  [place] [facility] [address] [place], [place], [address] Phone: [phone]   Fax:   [phone]  Name: REUVEN BRAVER MRN: [id number] Date of Birth: [birth year]

## 2017-12-14 ENCOUNTER — Encounter: Payer: Self-pay | Admitting: Physical Therapy

## 2017-12-14 ENCOUNTER — Ambulatory Visit: Payer: Medicare Other | Admitting: Physical Therapy

## 2017-12-14 DIAGNOSIS — M25512 Pain in left shoulder: Secondary | ICD-10-CM

## 2017-12-14 DIAGNOSIS — Z9889 Other specified postprocedural states: Secondary | ICD-10-CM | POA: Diagnosis not present

## 2017-12-14 DIAGNOSIS — M6281 Muscle weakness (generalized): Secondary | ICD-10-CM

## 2017-12-14 DIAGNOSIS — M25612 Stiffness of left shoulder, not elsewhere classified: Secondary | ICD-10-CM

## 2017-12-14 DIAGNOSIS — G8929 Other chronic pain: Secondary | ICD-10-CM

## 2017-12-14 NOTE — Therapy (Signed)
[place] [facility] [address] [place], [place], [address] Phone: [phone]   Fax:  [phone]  Physical Therapy Treatment  Patient Details  Name: Sean Hubbard MRN: [id number] Date of Birth: Oct 08, 1941 Referring Provider: Vonna Drafts, MD   Encounter Date: 12/14/2017  PT End of Session - 12/14/17 1251    Visit Number  14    Number of Visits  17    Date for PT Re-Evaluation  12/27/17    PT Start Time  7782    PT Stop Time  1406    PT Time Calculation (min)  85 min    Activity Tolerance  Patient tolerated treatment well    Behavior During Therapy  [facility] for tasks assessed/performed       Past Medical History:  Diagnosis Date  . COPD (chronic obstructive pulmonary disease) ([place])   . Gout   . Hyperlipemia   . Hypertension     History reviewed. No pertinent surgical history.  There were no vitals filed for this visit.  Subjective Assessment - 12/14/17 1244    Subjective  Pt. states he is sore and stiff today.  Pt. reports 3/10 L shoulder pain at this time.  Pt. not sleeping in the bed he prefers which is tough on L shoulder at night.      Pertinent History  pt. known well to PT clinic.  See previous PMHx.      Limitations  Lifting;House hold activities;Other (comment)    Patient Stated Goals  Increase L shoulder ROM/ strength to improve pain-free mobility and return to playing golf.      Currently in Pain?  Yes    Pain Score  3     Pain Location  Shoulder    Pain Orientation  Left    Pain Descriptors / Indicators  Aching;Sore    Pain Type  Chronic pain      Appt. To fuse finger on L hand end of February (after [place] trip).        TREATMENT    Ther-ex   Moist heat pack applied to L shoulder during UBE 3 min. F/b (warm-up/no charge).  Standing AAROM with wand flex/abd/ER x 20 each direction, significant verbal and tactile cues required to decrease  Standing AAROM with wand: chest press/ER/abd/Flex x multiple bouts x 10  in each direction; Standing GTB B shoulder extension/ horizontal flexion (pain tolerable)- 20x each. Standing L shoulder isometrics: flexion/ ER/ abd./ IR 10x each with 5 sec. Holds.  Supine L shoulder A/AROM flexion 10x.  R sidelying L shoulder ER AROM 20x.  Supine L serratus punches  Supine rhythmic stabilization L shoulder flex at 90 degrees, gentle resistance above elbow (all planes).   Cold pack applied to L shoulder in sitting following therapy x 10 minutes (unbilled).       PT Education - 12/13/17 1656    Education provided  Yes    Education Details  Exercise form/technique    Person(s) Educated  Patient    Methods  Explanation    Comprehension  Verbalized understanding          PT Long Term Goals - 11/30/17 1411      PT LONG TERM GOAL #1   Title  Pt. I with HEP to increase L shoulder PROM to [facility] as compared to R shoulder to improve progression to resisted ex./ strength training.      Baseline  Pt. presents with limited L shoulder AROM (supine/ seated): flexion (150/  51 deg.), abd. (98/ 44 deg.), ER 18 deg., IR 78 deg.  Pain limited PROM    Time  4    Period  Weeks    Status  Partially Met    Target Date  12/27/17      PT LONG TERM GOAL #2   Title  Pt. will reports no L shoulder pain/ tenderness with palpation to improve progression to isometrics.      Baseline  No tenderness with palpation to L sh. today.      Time  4    Period  Weeks    Status  Achieved    Target Date  11/29/17      PT LONG TERM GOAL #3   Title  Pt. will complete QuickDASH and score <30% to improve functional mobility with UE.      Baseline  QuickDASH: 38.6 on 11/29/17.      Time  4    Period  Weeks    Status  Not Met    Target Date  12/27/17      PT LONG TERM GOAL #4   Title  Pt. will increase to full L shoulder AROM/ isometrics with no increase c/o pain per MD protocol to promote return to househould/ work-related tasks.     Baseline  Pt. presents with limited L shoulder AROM (supine/  seated): flexion (150/ 51 deg.), abd. (98/ 44 deg.), ER 18 deg., IR 78 deg.  Pain limited PROM    Time  4    Period  Weeks    Status  Not Met    Target Date  12/27/17      PT LONG TERM GOAL #5   Title  Pt. able to return to playing 18 holes of golf with no increase c/o low back pain/ radicular symptoms.      Baseline  has not returned to golf at this time    Time  4    Period  Weeks    Status  On-going    Target Date  12/27/17            Plan - 12/14/17 1252    Clinical Impression Statement  L shoulder remains limited with all seated/standing AROM, esp. flexion/ abd./ ER.  Pt. continues to present with generalized L shoulder/scapular muscle weakness.  Pain limited with L shoulder AA/PROM flexion >90 deg.  Pt. continues to work hard during tx. session to improve UE strengthening/ functional ROM.      Clinical Presentation  Stable    Clinical Decision Making  Low    Rehab Potential  Good    PT Frequency  2x / week    PT Duration  4 weeks    PT Treatment/Interventions  Aquatic Therapy;Cryotherapy;Moist Heat;Electrical Stimulation;Ultrasound;Neuromuscular re-education;Balance training;Therapeutic exercise;Manual techniques    PT Next Visit Plan  Rhythmic stabilization of shoulder/ scapular depression.  REASSESS L shoulder AROM.      PT Home Exercise Plan  See HEP    Consulted and Agree with Plan of Care  Patient       Patient will benefit from skilled therapeutic intervention in order to improve the following deficits and impairments:  Pain, Improper body mechanics, Decreased coordination, Decreased mobility, Decreased scar mobility, Postural dysfunction, Decreased strength, Decreased range of motion, Decreased endurance, Impaired UE functional use, Hypomobility  Visit Diagnosis: S/P left rotator cuff repair  Shoulder joint stiffness, left  Muscle weakness (generalized)  Chronic left shoulder pain     Problem List There are no active problems  to display for this  patient.  Pura Spice, PT, DPT # [phone] 12/14/2017, 5:18 PM  [place] [facility] [address] [place], [place], [address] Phone: [phone]   Fax:  [phone]  Name: Sean Hubbard MRN: [id number] Date of Birth: 30-Dec-1940

## 2017-12-19 ENCOUNTER — Ambulatory Visit: Payer: Medicare Other | Attending: Orthopedic Surgery | Admitting: Physical Therapy

## 2017-12-19 ENCOUNTER — Encounter: Payer: Self-pay | Admitting: Physical Therapy

## 2017-12-19 DIAGNOSIS — M6281 Muscle weakness (generalized): Secondary | ICD-10-CM | POA: Diagnosis present

## 2017-12-19 DIAGNOSIS — M2560 Stiffness of unspecified joint, not elsewhere classified: Secondary | ICD-10-CM | POA: Diagnosis present

## 2017-12-19 DIAGNOSIS — G8929 Other chronic pain: Secondary | ICD-10-CM | POA: Diagnosis present

## 2017-12-19 DIAGNOSIS — M25512 Pain in left shoulder: Secondary | ICD-10-CM | POA: Insufficient documentation

## 2017-12-19 DIAGNOSIS — M25612 Stiffness of left shoulder, not elsewhere classified: Secondary | ICD-10-CM | POA: Insufficient documentation

## 2017-12-19 DIAGNOSIS — Z9889 Other specified postprocedural states: Secondary | ICD-10-CM | POA: Insufficient documentation

## 2017-12-19 NOTE — Therapy (Signed)
[facility] [address] [place], [place], [address] Phone: [phone]   Fax:  [phone]  Physical Therapy Treatment  Patient Details  Name: Sean Hubbard MRN: [id number] Date of Birth: 1941/09/27 Referring Provider: Vonna Drafts, MD   Encounter Date: 12/19/2017  PT End of Session - 12/19/17 1814    Visit Number  15    Number of Visits  17    Date for PT Re-Evaluation  12/27/17    PT Start Time  1111    PT Stop Time  1204    PT Time Calculation (min)  53 min    Activity Tolerance  Patient tolerated treatment well;Patient limited by pain    Behavior During Therapy  [facility] for tasks assessed/performed       Past Medical History:  Diagnosis Date  . COPD (chronic obstructive pulmonary disease) ([place])   . Gout   . Hyperlipemia   . Hypertension     History reviewed. No pertinent surgical history.  There were no vitals filed for this visit.  Subjective Assessment - 12/19/17 1812    Subjective  Pt. states increased pain and stiffness today in L shoulder. Pt. reported having mild increase of pain with there.ex. Pt. states he will bring hi swife next visit so she can help him with HEP as they go on extended trip.    Pertinent History  pt. known well to PT clinic.  See previous PMHx.      Limitations  Lifting;House hold activities;Other (comment)    Patient Stated Goals  Increase L shoulder ROM/ strength to improve pain-free mobility and return to playing golf.      Currently in Pain?  Yes    Pain Location  Shoulder    Pain Orientation  Left         There.ex: Seated UBE 3 min. F/b. Seated AAROM with wand for L shoulder- ER/ Abd/ Flex/ chest press Standing AAROM via PT Min A- over head flexion and reaching to remove/ replace cones x20 Standing Nautilus with bar- Lat pull down 40# x20/ scap row witrh supinated grip 30# x10/ biceps curl 30# x20 Reviewed HEP.   Grip strength testing.  Ice to L shoulder in sitting posture after  tx. Session.      PT Long Term Goals - 11/30/17 1411      PT LONG TERM GOAL #1   Title  Pt. I with HEP to increase L shoulder PROM to [facility] as compared to R shoulder to improve progression to resisted ex./ strength training.      Baseline  Pt. presents with limited L shoulder AROM (supine/ seated): flexion (150/ 51 deg.), abd. (98/ 44 deg.), ER 18 deg., IR 78 deg.  Pain limited PROM    Time  4    Period  Weeks    Status  Partially Met    Target Date  12/27/17      PT LONG TERM GOAL #2   Title  Pt. will reports no L shoulder pain/ tenderness with palpation to improve progression to isometrics.      Baseline  No tenderness with palpation to L sh. today.      Time  4    Period  Weeks    Status  Achieved    Target Date  11/29/17      PT LONG TERM GOAL #3   Title  Pt. will complete QuickDASH and score <30% to improve functional mobility with UE.  Baseline  QuickDASH: 38.6 on 11/29/17.      Time  4    Period  Weeks    Status  Not Met    Target Date  12/27/17      PT LONG TERM GOAL #4   Title  Pt. will increase to full L shoulder AROM/ isometrics with no increase c/o pain per MD protocol to promote return to househould/ work-related tasks.     Baseline  Pt. presents with limited L shoulder AROM (supine/ seated): flexion (150/ 51 deg.), abd. (98/ 44 deg.), ER 18 deg., IR 78 deg.  Pain limited PROM    Time  4    Period  Weeks    Status  Not Met    Target Date  12/27/17      PT LONG TERM GOAL #5   Title  Pt. able to return to playing 18 holes of golf with no increase c/o low back pain/ radicular symptoms.      Baseline  has not returned to golf at this time    Time  4    Period  Weeks    Status  On-going    Target Date  12/27/17            Plan - 12/19/17 1816    Clinical Impression Statement  L Shoulder pain was increased today, but ROM was not diminished and there.ex was still able to be progressed. Pt. showed improved functional flexion today with AAROM cone stackiing  above head height.     Clinical Presentation  Stable    Clinical Decision Making  Low    Rehab Potential  Good    PT Frequency  2x / week    PT Duration  4 weeks    PT Treatment/Interventions  Aquatic Therapy;Cryotherapy;Moist Heat;Electrical Stimulation;Ultrasound;Neuromuscular re-education;Balance training;Therapeutic exercise;Manual techniques    PT Next Visit Plan  Rhythmic stabilization of shoulder/ scapular depression.  REASSESS L shoulder AROM.  Issue new HEP for pts. trip to [place].      PT Home Exercise Plan  See HEP    Consulted and Agree with Plan of Care  Patient       Patient will benefit from skilled therapeutic intervention in order to improve the following deficits and impairments:  Pain, Improper body mechanics, Decreased coordination, Decreased mobility, Decreased scar mobility, Postural dysfunction, Decreased strength, Decreased range of motion, Decreased endurance, Impaired UE functional use, Hypomobility  Visit Diagnosis: S/P left rotator cuff repair  Shoulder joint stiffness, left  Muscle weakness (generalized)  Chronic left shoulder pain     Problem List There are no active problems to display for this patient.  Pura Spice, PT, DPT # [id number] 12/20/2017, 9:32 AM  [place] [facility] [address] [place], [place], [address] Phone: [phone]   Fax:  [phone]  Name: Sean Hubbard MRN: [id number] Date of Birth: [birth year]

## 2017-12-21 ENCOUNTER — Encounter: Payer: Medicare Other | Admitting: Physical Therapy

## 2017-12-21 ENCOUNTER — Ambulatory Visit: Payer: Medicare Other | Admitting: Physical Therapy

## 2017-12-21 ENCOUNTER — Encounter: Payer: Self-pay | Admitting: Physical Therapy

## 2017-12-21 DIAGNOSIS — Z9889 Other specified postprocedural states: Secondary | ICD-10-CM

## 2017-12-21 DIAGNOSIS — G8929 Other chronic pain: Secondary | ICD-10-CM

## 2017-12-21 DIAGNOSIS — M25612 Stiffness of left shoulder, not elsewhere classified: Secondary | ICD-10-CM

## 2017-12-21 DIAGNOSIS — M6281 Muscle weakness (generalized): Secondary | ICD-10-CM

## 2017-12-21 DIAGNOSIS — M25512 Pain in left shoulder: Secondary | ICD-10-CM

## 2017-12-21 NOTE — Therapy (Signed)
[place] [facility] [address] [place], [place], [address] Phone: [phone]   Fax:  [phone]  Physical Therapy Treatment  Patient Details  Name: Sean Hubbard MRN: [id number] Date of Birth: 09/15/1941 Referring Provider: Vonna Drafts, MD   Encounter Date: 12/21/2017  PT End of Session - 12/21/17 2025    Visit Number  16    Number of Visits  17    Date for PT Re-Evaluation  12/27/17    PT Start Time  0355    PT Stop Time  1636    PT Time Calculation (min)  51 min    Activity Tolerance  Patient tolerated treatment well;Patient limited by pain    Behavior During Therapy  [facility] for tasks assessed/performed       Past Medical History:  Diagnosis Date  . COPD (chronic obstructive pulmonary disease) ([place])   . Gout   . Hyperlipemia   . Hypertension     History reviewed. No pertinent surgical history.  There were no vitals filed for this visit.  Subjective Assessment - 12/21/17 2019    Subjective  Pt. reports increased pain in R UE, and improved pain in L UE. Pt. reports having a "few drinks" prior to therapy session while playing poker. Pt. reports increased mobility compared to last visit.    Pertinent History  pt. known well to PT clinic.  See previous PMHx.      Limitations  Lifting;House hold activities;Other (comment)    Patient Stated Goals  Increase L shoulder ROM/ strength to improve pain-free mobility and return to playing golf.      Currently in Pain?  Yes    Pain Score  2     Pain Location  Shoulder    Pain Orientation  Left         There.ex: Standing L shoulder AAROM with wand flexion, abduction, extension, Er 2 sets x10 Supine AAROM with light touch assist on LE- scapular punch and flexion 2 sets x10 Supine L shoulder in 90 degree flexion rhythmic circles with 1# weight foor 30 sec clockwise and counter clockwise x2 Supine L shoulder in 90 degree flexion and 1# weight rhythmic stabilizations with PT pertubations  for 30 sec x2 R side slying ER 1# free weight with towel in between arm and trunk. 2 sets x10     PT Education - 12/21/17 1709    Education provided  Yes    Education Details  See HEP/ trained wife with HEP for upcoming trip to [place]    Person(s) Educated  Patient;Spouse    Methods  Explanation;Demonstration;Handout    Comprehension  Verbalized understanding;Returned demonstration          PT Long Term Goals - 11/30/17 1411      PT LONG TERM GOAL #1   Title  Pt. I with HEP to increase L shoulder PROM to [facility] as compared to R shoulder to improve progression to resisted ex./ strength training.      Baseline  Pt. presents with limited L shoulder AROM (supine/ seated): flexion (150/ 51 deg.), abd. (98/ 44 deg.), ER 18 deg., IR 78 deg.  Pain limited PROM    Time  4    Period  Weeks    Status  Partially Met    Target Date  12/27/17      PT LONG TERM GOAL #2   Title  Pt. will reports no L shoulder pain/ tenderness with palpation to improve progression to isometrics.  Baseline  No tenderness with palpation to L sh. today.      Time  4    Period  Weeks    Status  Achieved    Target Date  11/29/17      PT LONG TERM GOAL #3   Title  Pt. will complete QuickDASH and score <30% to improve functional mobility with UE.      Baseline  QuickDASH: 38.6 on 11/29/17.      Time  4    Period  Weeks    Status  Not Met    Target Date  12/27/17      PT LONG TERM GOAL #4   Title  Pt. will increase to full L shoulder AROM/ isometrics with no increase c/o pain per MD protocol to promote return to househould/ work-related tasks.     Baseline  Pt. presents with limited L shoulder AROM (supine/ seated): flexion (150/ 51 deg.), abd. (98/ 44 deg.), ER 18 deg., IR 78 deg.  Pain limited PROM    Time  4    Period  Weeks    Status  Not Met    Target Date  12/27/17      PT LONG TERM GOAL #5   Title  Pt. able to return to playing 18 holes of golf with no increase c/o low back pain/ radicular symptoms.       Baseline  has not returned to golf at this time    Time  4    Period  Weeks    Status  On-going    Target Date  12/27/17            Plan - 12/21/17 2030    Clinical Impression Statement  L shoulder ROM was improved today with less increase in pain. Pt. brougt wife to therapy session to practice HEP for their extended trip out of country. Pt. and wife understood there.ex and will be capable of performing HEP independent. Pt. was reserved today compared to ususal, but likely due to wife being present.    Clinical Presentation  Stable    Clinical Decision Making  Low    Rehab Potential  Good    PT Frequency  2x / week    PT Duration  4 weeks    PT Treatment/Interventions  Aquatic Therapy;Cryotherapy;Moist Heat;Electrical Stimulation;Ultrasound;Neuromuscular re-education;Balance training;Therapeutic exercise;Manual techniques    PT Next Visit Plan  reassess goals    PT Home Exercise Plan  See HEP    Consulted and Agree with Plan of Care  Patient;Family member/caregiver       Patient will benefit from skilled therapeutic intervention in order to improve the following deficits and impairments:  Pain, Improper body mechanics, Decreased coordination, Decreased mobility, Decreased scar mobility, Postural dysfunction, Decreased strength, Decreased range of motion, Decreased endurance, Impaired UE functional use, Hypomobility  Visit Diagnosis: S/P left rotator cuff repair  Shoulder joint stiffness, left  Muscle weakness (generalized)  Chronic left shoulder pain     Problem List There are no active problems to display for this patient.  Pura Spice, PT, DPT # [id number] Rosario Adie, SPT 12/21/2017, 8:33 PM  [place] [facility] [address] [place], [place], [address] Phone: [phone]   Fax:  [phone]  Name: Sean Hubbard MRN: [id number] Date of Birth: 1941/09/01

## 2018-01-04 ENCOUNTER — Other Ambulatory Visit: Payer: Self-pay

## 2018-01-04 ENCOUNTER — Ambulatory Visit: Payer: Medicare Other | Admitting: Physical Therapy

## 2018-01-04 ENCOUNTER — Encounter: Payer: Self-pay | Admitting: Physical Therapy

## 2018-01-04 DIAGNOSIS — Z9889 Other specified postprocedural states: Secondary | ICD-10-CM

## 2018-01-04 DIAGNOSIS — M2560 Stiffness of unspecified joint, not elsewhere classified: Secondary | ICD-10-CM

## 2018-01-04 DIAGNOSIS — M25612 Stiffness of left shoulder, not elsewhere classified: Secondary | ICD-10-CM

## 2018-01-04 DIAGNOSIS — G8929 Other chronic pain: Secondary | ICD-10-CM

## 2018-01-04 DIAGNOSIS — M25512 Pain in left shoulder: Secondary | ICD-10-CM

## 2018-01-04 DIAGNOSIS — M6281 Muscle weakness (generalized): Secondary | ICD-10-CM

## 2018-01-04 NOTE — Therapy (Signed)
[place] [facility] [address] [place], [place], [address] Phone: [phone]   Fax:  [phone]  Physical Therapy Treatment  Patient Details  Name: Sean Hubbard MRN: [id number] Date of Birth: 05/25/1941 Referring Provider: Joseph Wilson, MD   Encounter Date: 01/04/2018  PT End of Session - 01/04/18 1758    Visit Number  17    Number of Visits  25    Date for PT Re-Evaluation  02/01/18    PT Start Time  0855    PT Stop Time  0948    PT Time Calculation (min)  53 min    Activity Tolerance  Patient tolerated treatment well;Patient limited by pain    Behavior During Therapy  [facility] for tasks assessed/performed       Past Medical History:  Diagnosis Date  . COPD (chronic obstructive pulmonary disease) ([facility])   . Gout   . Hyperlipemia   . Hypertension     History reviewed. No pertinent surgical history.  There were no vitals filed for this visit.  Subjective Assessment - 01/04/18 1757    Subjective  Pt. states feeling tired today. Pt. reports shoulder felt "good" over vacation, did exercises in pool every day. Pt. reports B shoulders are sore today 3/10 NPS current in L. Pt. remarked that exercises in the water felt "good".    Pertinent History  pt. known well to PT clinic.  See previous PMHx.      Limitations  Lifting;House hold activities;Other (comment)    Patient Stated Goals  Increase L shoulder ROM/ strength to improve pain-free mobility and return to playing golf.      Currently in Pain?  Yes    Pain Score  3     Pain Location  Shoulder    Pain Orientation  Left    Pain Type  Surgical pain    Pain Onset  More than a month ago          There.ex: Standing scapular motion elevation/depression/protraction/retraction x20 Standing L shoulder AAROM with wand flexion, abduction, extension, Er 2 sets x10 Supine L shoulder in 90 degree flexion rhythmic circles with 1# weight for 30 sec clockwise and counter clockwise x2 R side  lying ER 1# free weight with towel in between arm and trunk. 2 sets x10 R side lying L shoulder flex x10       PT Long Term Goals - 01/04/18 0926      PT LONG TERM GOAL #1   Title  Pt. I with HEP to increase L shoulder PROM to WFL as compared to R shoulder to improve progression to resisted ex./ strength training.      Baseline  on 2/20: Pt. presents with limited L shoulder AROM (supine/ seated): flexion (123/ 50 deg.), abd. (100/ 75 deg.), ER 11 deg., IR 68 deg.  AAROM flex(118)    Time  4    Period  Weeks    Status  Partially Met      PT LONG TERM GOAL #2   Title  Pt. will reports no L shoulder pain/ tenderness with palpation to improve progression to isometrics.      Baseline  No tenderness with palpation to L sh. today.      Time  4    Period  Weeks    Status  Achieved      PT LONG TERM GOAL #3   Title  Pt. will complete QuickDASH and score <30% to improve functional mobility with   UE.      Baseline  QuickDASH: 40 on 01/04/18.      Time  4    Period  Weeks    Status  Not Met      PT LONG TERM GOAL #4   Title  Pt. will increase to full L shoulder AROM/ isometrics with no increase c/o pain per MD protocol to promote return to househould/ work-related tasks.     Baseline  on 2/20: Pt. presents with limited L shoulder AROM (supine/ seated): flexion (123/ 50 deg.), abd. (100/ 75 deg.), ER 11 deg., IR 68 deg.  AAROM flex(118)    Time  4    Period  Weeks    Status  Partially Met    Target Date  02/01/18      PT LONG TERM GOAL #5   Title  Pt. able to return to playing 18 holes of golf with no increase c/o low back pain/ radicular symptoms.      Baseline  has not returned to golf at this time    Time  4    Period  Weeks    Status  On-going            Plan - 01/04/18 1806    Clinical Impression Statement  Pt. has not been to PT in over a week because he was on vacation, but he states compliance with HEP while on vacation. Pt. had diminished L shoulder AROM today compared  to last visit, but still improved overall. Pt. is still very limited in L shoulder ER strength and ROM.  PT will continue to focus on L shoulder ROM/ strengthening to promote return to golfing.    Clinical Presentation  Stable    Clinical Decision Making  Low    Rehab Potential  Good    PT Frequency  2x / week    PT Duration  4 weeks    PT Treatment/Interventions  Aquatic Therapy;Cryotherapy;Moist Heat;Electrical Stimulation;Ultrasound;Neuromuscular re-education;Balance training;Therapeutic exercise;Manual techniques;Patient/family education;Therapeutic activities    PT Next Visit Plan  Manual therpapy to improve ROM/ ER strengthening/ gross shoulder strengthening with Nautlius    PT Home Exercise Plan  See HEP    Consulted and Agree with Plan of Care  Patient;Family member/caregiver       Patient will benefit from skilled therapeutic intervention in order to improve the following deficits and impairments:  Pain, Improper body mechanics, Decreased coordination, Decreased mobility, Decreased scar mobility, Postural dysfunction, Decreased strength, Decreased range of motion, Decreased endurance, Impaired UE functional use, Hypomobility  Visit Diagnosis: S/P left rotator cuff repair  Shoulder joint stiffness, left  Muscle weakness (generalized)  Chronic left shoulder pain  Joint stiffness     Problem List There are no active problems to display for this patient.  Pura Spice, PT, DPT # [id number] 01/04/2018, 6:35 PM  [place] [facility] [address] [place], [place], [address] Phone: [phone]   Fax:  [phone]  Name: Sean Hubbard MRN: [id number] Date of Birth: 04/19/1941

## 2018-01-09 ENCOUNTER — Encounter: Payer: Self-pay | Admitting: Physical Therapy

## 2018-01-09 ENCOUNTER — Ambulatory Visit: Payer: Medicare Other | Admitting: Physical Therapy

## 2018-01-09 DIAGNOSIS — G8929 Other chronic pain: Secondary | ICD-10-CM

## 2018-01-09 DIAGNOSIS — M25512 Pain in left shoulder: Secondary | ICD-10-CM

## 2018-01-09 DIAGNOSIS — M6281 Muscle weakness (generalized): Secondary | ICD-10-CM

## 2018-01-09 DIAGNOSIS — Z9889 Other specified postprocedural states: Secondary | ICD-10-CM | POA: Diagnosis not present

## 2018-01-09 DIAGNOSIS — M25612 Stiffness of left shoulder, not elsewhere classified: Secondary | ICD-10-CM

## 2018-01-09 NOTE — Therapy (Signed)
[place] [facility] [address] [place], [place], [address] Phone: [phone]   Fax:  [phone]  Physical Therapy Treatment  Patient Details  Name: Sean Hubbard MRN: [id number] Date of Birth: Sep 24, 1941 Referring Provider: Vonna Drafts, MD   Encounter Date: 01/09/2018  PT End of Session - 01/09/18 1842    Visit Number  18    Number of Visits  25    Date for PT Re-Evaluation  02/01/18    PT Start Time  1024    PT Stop Time  1129    PT Time Calculation (min)  65 min    Activity Tolerance  Patient tolerated treatment well;Patient limited by pain    Behavior During Therapy  [facility] for tasks assessed/performed       Past Medical History:  Diagnosis Date  . COPD (chronic obstructive pulmonary disease) ([place])   . Gout   . Hyperlipemia   . Hypertension     History reviewed. No pertinent surgical history.  There were no vitals filed for this visit.  Subjective Assessment - 01/09/18 1841    Subjective  Pt. states his L shoulder is really stiff and hurting today.  Pt. joined the pool at [facility] to assist in shoulder rehab.      Pertinent History  pt. known well to PT clinic.  See previous PMHx.      Limitations  Lifting;House hold activities;Other (comment)    Patient Stated Goals  Increase L shoulder ROM/ strength to improve pain-free mobility and return to playing golf.      Currently in Pain?  Yes    Pain Score  4     Pain Location  Shoulder    Pain Orientation  Left    Pain Descriptors / Indicators  Aching;Sore;Tightness        MH to L shoulder in sitting prior to tx. Session.    There.ex:  B UBE 3 min. F/b. (warm-up/ no charge) Standing shoulder flexion at wall ladder 5x with min. PT assist. Seated/ supine L shoulder AAROM with wand flexion, abduction, extension, Er 2 sets x10 Standing Nautilus: 40# lat. Pull downs/ scap. Retraction/ 30# tricep ext./ shoulder extension 20x each.    Manual tx.  Supine L  shoulder AA/PROM 8x all planes with static holds (pain limited). STM to L UT/ deltoid musculature 6 min. Supine L sh. AP/inf. Mobs. Grade II-III 4x 20 sec. In supine position (pain limited/ modified). Supine/ seated R cervical lat. Flexion with L UT STM.       PT Long Term Goals - 01/04/18 0926      PT LONG TERM GOAL #1   Title  Pt. I with HEP to increase L shoulder PROM to [facility] as compared to R shoulder to improve progression to resisted ex./ strength training.      Baseline  on 2/20: Pt. presents with limited L shoulder AROM (supine/ seated): flexion (123/ 50 deg.), abd. (100/ 75 deg.), ER 11 deg., IR 68 deg.  AAROM flex(118)    Time  4    Period  Weeks    Status  Partially Met      PT LONG TERM GOAL #2   Title  Pt. will reports no L shoulder pain/ tenderness with palpation to improve progression to isometrics.      Baseline  No tenderness with palpation to L sh. today.      Time  4    Period  Weeks    Status  Achieved      PT LONG TERM GOAL #3   Title  Pt. will complete QuickDASH and score <30% to improve functional mobility with UE.      Baseline  QuickDASH: 40 on 01/04/18.      Time  4    Period  Weeks    Status  Not Met      PT LONG TERM GOAL #4   Title  Pt. will increase to full L shoulder AROM/ isometrics with no increase c/o pain per MD protocol to promote return to househould/ work-related tasks.     Baseline  on 2/20: Pt. presents with limited L shoulder AROM (supine/ seated): flexion (123/ 50 deg.), abd. (100/ 75 deg.), ER 11 deg., IR 68 deg.  AAROM flex(118)    Time  4    Period  Weeks    Status  Partially Met    Target Date  02/01/18      PT LONG TERM GOAL #5   Title  Pt. able to return to playing 18 holes of golf with no increase c/o low back pain/ radicular symptoms.      Baseline  has not returned to golf at this time    Time  4    Period  Weeks    Status  On-going            Plan - 01/09/18 1843    Clinical Impression Statement  Pt. very pain  limited with L shoulder AA/PROM and limited with L shoulder joint mobs (esp. inferior glides).  L UT muscle compensation with overhead reaching.  Pt. instructed to continue with HEP/ stretches and advised on proper ex./ movement patterns in pool.      Clinical Presentation  Stable    Clinical Decision Making  Low    Rehab Potential  Good    PT Frequency  2x / week    PT Duration  4 weeks    PT Treatment/Interventions  Aquatic Therapy;Cryotherapy;Moist Heat;Electrical Stimulation;Ultrasound;Neuromuscular re-education;Balance training;Therapeutic exercise;Manual techniques;Patient/family education;Therapeutic activities    PT Next Visit Plan  Manual therppy to improve ROM/ ER strengthening/ gross shoulder strengthening with Nautlius    PT Home Exercise Plan  See HEP    Consulted and Agree with Plan of Care  Patient;Family member/caregiver       Patient will benefit from skilled therapeutic intervention in order to improve the following deficits and impairments:  Pain, Improper body mechanics, Decreased coordination, Decreased mobility, Decreased scar mobility, Postural dysfunction, Decreased strength, Decreased range of motion, Decreased endurance, Impaired UE functional use, Hypomobility  Visit Diagnosis: S/P left rotator cuff repair  Shoulder joint stiffness, left  Muscle weakness (generalized)  Chronic left shoulder pain     Problem List There are no active problems to display for this patient.  Pura Spice, PT, DPT # [id number] 01/09/2018, 6:46 PM  [facility] [address] [place], [place], [address] Phone: [phone]   Fax:  [phone]  Name: Sean Hubbard MRN: [id number] Date of Birth: [birth year]

## 2018-01-11 ENCOUNTER — Encounter: Payer: Self-pay | Admitting: Physical Therapy

## 2018-01-11 ENCOUNTER — Ambulatory Visit: Payer: Medicare Other | Admitting: Physical Therapy

## 2018-01-11 DIAGNOSIS — M25612 Stiffness of left shoulder, not elsewhere classified: Secondary | ICD-10-CM

## 2018-01-11 DIAGNOSIS — M6281 Muscle weakness (generalized): Secondary | ICD-10-CM

## 2018-01-11 DIAGNOSIS — G8929 Other chronic pain: Secondary | ICD-10-CM

## 2018-01-11 DIAGNOSIS — Z9889 Other specified postprocedural states: Secondary | ICD-10-CM

## 2018-01-11 DIAGNOSIS — M25512 Pain in left shoulder: Secondary | ICD-10-CM

## 2018-01-11 NOTE — Therapy (Addendum)
[facility] [address] [place], [place], [address] Phone: [phone]   Fax:  [phone]  Physical Therapy Treatment  Patient Details  Name: Sean Hubbard MRN: [id number] Date of Birth: 1940-11-29 Referring Provider: Vonna Drafts, MD   Encounter Date: 01/11/2018    Treatment 19 of 25.  Recert date: 3/88/82   Past Medical History:  Diagnosis Date  . COPD (chronic obstructive pulmonary disease) ([place])   . Gout   . Hyperlipemia   . Hypertension     History reviewed. No pertinent surgical history.  There were no vitals filed for this visit.     Pt. states he is feeling good today. Pt. planning to go to [facility] pool after PT tx. session.      MH to L shoulder in sitting prior to tx. Session.   There.ex:  B UBE 3 min. F/b. (warm-up/ no charge) 4# bicep curls/ tricep ext in standing at mirror 20x each. Standing sh. Flexion AROM 5x (PT assist). Seated/ supine L shoulder AAROM with wand flexion, abduction, extension, Er 2 sets x10 Standing Nautilus: 40# lat. Pull downs/ scap. Retraction/ 30# tricep ext./ shoulder extension 20x each.    Manual tx.  Supine L shoulder AA/PROM 10x all planes with static holds (pain limited). STM to L UT/ deltoid musculature 6 min. Supine L sh. AP/inf. Mobs. Grade II-III 4x 20 sec. In supine position (pain limited/ modified). Supine/ seated R cervical lat. Flexion with L UT STM.          Pt. returns to MD next Monday (01/16/18). Pt. remains limited with L shoulder active flexion/abd. in standing posture. Increase pain/ compensation with L shoulder flexion PROM >90 deg. in standing posture. Increase L UT muscle compensation/ heel raises/ R lateral leaning. Slow progress towards AROM goals at this time.     PT Long Term Goals - 01/04/18 0926      PT LONG TERM GOAL #1   Title  Pt. I with HEP to increase L shoulder PROM to [facility] as compared to R shoulder to improve progression to  resisted ex./ strength training.      Baseline  on 2/20: Pt. presents with limited L shoulder AROM (supine/ seated): flexion (123/ 50 deg.), abd. (100/ 75 deg.), ER 11 deg., IR 68 deg.  AAROM flex(118)    Time  4    Period  Weeks    Status  Partially Met      PT LONG TERM GOAL #2   Title  Pt. will reports no L shoulder pain/ tenderness with palpation to improve progression to isometrics.      Baseline  No tenderness with palpation to L sh. today.      Time  4    Period  Weeks    Status  Achieved      PT LONG TERM GOAL #3   Title  Pt. will complete QuickDASH and score <30% to improve functional mobility with UE.      Baseline  QuickDASH: 40 on 01/04/18.      Time  4    Period  Weeks    Status  Not Met      PT LONG TERM GOAL #4   Title  Pt. will increase to full L shoulder AROM/ isometrics with no increase c/o pain per MD protocol to promote return to househould/ work-related tasks.     Baseline  on 2/20: Pt. presents with limited L shoulder AROM (supine/ seated): flexion (123/ 50 deg.),  abd. (100/ 75 deg.), ER 11 deg., IR 68 deg.  AAROM flex(118)    Time  4    Period  Weeks    Status  Partially Met    Target Date  02/01/18      PT LONG TERM GOAL #5   Title  Pt. able to return to playing 18 holes of golf with no increase c/o low back pain/ radicular symptoms.      Baseline  has not returned to golf at this time    Time  4    Period  Weeks    Status  On-going              Patient will benefit from skilled therapeutic intervention in order to improve the following deficits and impairments:  Pain, Improper body mechanics, Decreased coordination, Decreased mobility, Decreased scar mobility, Postural dysfunction, Decreased strength, Decreased range of motion, Decreased endurance, Impaired UE functional use, Hypomobility  Visit Diagnosis: S/P left rotator cuff repair  Shoulder joint stiffness, left  Muscle weakness (generalized)  Chronic left shoulder pain     Problem  List There are no active problems to display for this patient.  Pura Spice, PT, DPT # [phone] 01/16/2018, 7:39 AM  [facility] [address] [place], [place], [address] Phone: [phone]   Fax:  [phone]  Name: Sean Hubbard MRN: [id number] Date of Birth: [birth year]

## 2018-01-16 ENCOUNTER — Encounter: Payer: Medicare Other | Admitting: Physical Therapy

## 2018-01-16 ENCOUNTER — Encounter: Payer: Self-pay | Admitting: Physical Therapy

## 2018-01-16 ENCOUNTER — Ambulatory Visit: Payer: Medicare Other | Attending: Orthopedic Surgery | Admitting: Physical Therapy

## 2018-01-16 DIAGNOSIS — M25612 Stiffness of left shoulder, not elsewhere classified: Secondary | ICD-10-CM | POA: Insufficient documentation

## 2018-01-16 DIAGNOSIS — M25512 Pain in left shoulder: Secondary | ICD-10-CM | POA: Diagnosis present

## 2018-01-16 DIAGNOSIS — G8929 Other chronic pain: Secondary | ICD-10-CM | POA: Diagnosis present

## 2018-01-16 DIAGNOSIS — M2560 Stiffness of unspecified joint, not elsewhere classified: Secondary | ICD-10-CM | POA: Insufficient documentation

## 2018-01-16 DIAGNOSIS — M6281 Muscle weakness (generalized): Secondary | ICD-10-CM | POA: Insufficient documentation

## 2018-01-16 DIAGNOSIS — Z9889 Other specified postprocedural states: Secondary | ICD-10-CM | POA: Insufficient documentation

## 2018-01-16 NOTE — Therapy (Signed)
[place] [facility] [address] [place], [place], [address] Phone: [phone]   Fax:  [phone]  Physical Therapy Treatment  Patient Details  Name: Sean Hubbard MRN: [id number] Date of Birth: [birth year] Referring Provider: Vonna Drafts, MD   Encounter Date: 01/16/2018  PT End of Session - 01/16/18 1721    Visit Number  20    Number of Visits  25    Date for PT Re-Evaluation  02/01/18    PT Start Time  0942    PT Stop Time  1037    PT Time Calculation (min)  55 min    Activity Tolerance  Patient tolerated treatment well;Patient limited by pain    Behavior During Therapy  [facility] for tasks assessed/performed       Past Medical History:  Diagnosis Date  . COPD (chronic obstructive pulmonary disease) ([place])   . Gout   . Hyperlipemia   . Hypertension     History reviewed. No pertinent surgical history.  There were no vitals filed for this visit.  Subjective Assessment - 01/16/18 1715    Subjective  Pt. reports stiffness/ soreness in L shoulder this morning.  Pt. returns to MD this afternoon.      Pertinent History  pt. known well to PT clinic.  See previous PMHx.      Limitations  Lifting;House hold activities;Other (comment)    Patient Stated Goals  Increase L shoulder ROM/ strength to improve pain-free mobility and return to playing golf.      Currently in Pain?  Yes    Pain Score  3     Pain Location  Shoulder    Pain Orientation  Left    Pain Descriptors / Indicators  Aching         MH to L shoulder in supine prior to supine ROM.  There.ex:  Nustep L6 10 min. B UE/LE (warm-up).   Seated sh. Pulley flexion/ abd. With increase static holds 10x.2 each.  Standing sh. Flexion AROM 5x (PT assist). Seated/ supineL shoulder AAROM with wand flexion, abduction, extension, Er 2 sets x10 Standing Nautilus: 40# lat. Pull downs/ scap. Retraction/ 30# tricep ext./ shoulder extension 20x each.  Reassessed supine/ seated L sh.  AROM.  Manual tx.  Supine L shoulder AA/PROM 10x all planes with static holds (pain limited). STM to L UT/ deltoid musculature 6 min. Supine L sh. AP/inf. Mobs. Grade II-III 4x 20 sec. In supine position (pain limited/ modified).   Ice to L shoulder in sitting after tx.      PT Long Term Goals - 01/04/18 0926      PT LONG TERM GOAL #1   Title  Pt. I with HEP to increase L shoulder PROM to [facility] as compared to R shoulder to improve progression to resisted ex./ strength training.      Baseline  on 2/20: Pt. presents with limited L shoulder AROM (supine/ seated): flexion (123/ 50 deg.), abd. (100/ 75 deg.), ER 11 deg., IR 68 deg.  AAROM flex(118)    Time  4    Period  Weeks    Status  Partially Met      PT LONG TERM GOAL #2   Title  Pt. will reports no L shoulder pain/ tenderness with palpation to improve progression to isometrics.      Baseline  No tenderness with palpation to L sh. today.      Time  4    Period  Weeks  Status  Achieved      PT LONG TERM GOAL #3   Title  Pt. will complete QuickDASH and score <30% to improve functional mobility with UE.      Baseline  QuickDASH: 40 on 01/04/18.      Time  4    Period  Weeks    Status  Not Met      PT LONG TERM GOAL #4   Title  Pt. will increase to full L shoulder AROM/ isometrics with no increase c/o pain per MD protocol to promote return to househould/ work-related tasks.     Baseline  on 2/20: Pt. presents with limited L shoulder AROM (supine/ seated): flexion (123/ 50 deg.), abd. (100/ 75 deg.), ER 11 deg., IR 68 deg.  AAROM flex(118)    Time  4    Period  Weeks    Status  Partially Met    Target Date  02/01/18      PT LONG TERM GOAL #5   Title  Pt. able to return to playing 18 holes of golf with no increase c/o low back pain/ radicular symptoms.      Baseline  has not returned to golf at this time    Time  4    Period  Weeks    Status  On-going            Plan - 01/16/18 1723    Clinical Impression  Statement  Supine L shoulder AROM: flexion 133 deg., abd. 107 deg., ER 31 deg., IR 74 deg.   Seated L shoulder AROM: flexion 82 deg., abd. 74 deg.  Significant L shoulder joint tightness.  Pt. progressing with L shoulder strengthening ex. program but limited by L sh. joint stiffness/ decrease AROM.  Less L UT compensation today with overhead Nautilus ex.      Clinical Presentation  Stable    Clinical Decision Making  Low    Rehab Potential  Good    PT Frequency  2x / week    PT Duration  4 weeks    PT Treatment/Interventions  Aquatic Therapy;Cryotherapy;Moist Heat;Electrical Stimulation;Ultrasound;Neuromuscular re-education;Balance training;Therapeutic exercise;Manual techniques;Patient/family education;Therapeutic activities    PT Next Visit Plan  Manual therapy to improve ROM/ ER strengthening/ gross shoulder strengthening with Nautlius.  Discuss MD f/u    PT Home Exercise Plan  See HEP       Patient will benefit from skilled therapeutic intervention in order to improve the following deficits and impairments:  Pain, Improper body mechanics, Decreased coordination, Decreased mobility, Decreased scar mobility, Postural dysfunction, Decreased strength, Decreased range of motion, Decreased endurance, Impaired UE functional use, Hypomobility  Visit Diagnosis: S/P left rotator cuff repair  Shoulder joint stiffness, left  Muscle weakness (generalized)  Chronic left shoulder pain     Problem List There are no active problems to display for this patient.  Pura Spice, PT, DPT # [phone] 01/16/2018, 5:27 PM  [place] [facility] [address] [place], [place], [address] Phone: [phone]   Fax:  [phone]  Name: Sean Hubbard MRN: [id number] Date of Birth: May 12, 1941

## 2018-01-18 ENCOUNTER — Encounter: Payer: Self-pay | Admitting: Physical Therapy

## 2018-01-18 ENCOUNTER — Ambulatory Visit: Payer: Medicare Other | Admitting: Physical Therapy

## 2018-01-18 DIAGNOSIS — Z9889 Other specified postprocedural states: Secondary | ICD-10-CM | POA: Diagnosis not present

## 2018-01-18 DIAGNOSIS — G8929 Other chronic pain: Secondary | ICD-10-CM

## 2018-01-18 DIAGNOSIS — M25512 Pain in left shoulder: Secondary | ICD-10-CM

## 2018-01-18 DIAGNOSIS — M6281 Muscle weakness (generalized): Secondary | ICD-10-CM

## 2018-01-18 DIAGNOSIS — M25612 Stiffness of left shoulder, not elsewhere classified: Secondary | ICD-10-CM

## 2018-01-18 NOTE — Therapy (Signed)
[place] [facility] [address] [place], [place], [address] Phone: [phone]   Fax:  [phone]  Physical Therapy Treatment  Patient Details  Name: Sean Hubbard MRN: [id number] Date of Birth: 06/23/1941 Referring Provider: Vonna Drafts, MD   Encounter Date: 01/18/2018  PT End of Session - 01/18/18 1252    Visit Number  21    Number of Visits  25    Date for PT Re-Evaluation  02/01/18    PT Start Time  1250    PT Stop Time  6962    PT Time Calculation (min)  59 min    Activity Tolerance  Patient tolerated treatment well;Patient limited by pain    Behavior During Therapy  [facility] for tasks assessed/performed       Past Medical History:  Diagnosis Date  . COPD (chronic obstructive pulmonary disease) ([place])   . Gout   . Hyperlipemia   . Hypertension     History reviewed. No pertinent surgical history.  There were no vitals filed for this visit.  Subjective Assessment - 01/18/18 1249    Subjective  Pt. reports MD is happy with pts. progress and states pt. is okay to return to golf.  No pain in L shoulder at this time.      Pertinent History  pt. known well to PT clinic.  See previous PMHx.      Limitations  Lifting;House hold activities;Other (comment)    Patient Stated Goals  Increase L shoulder ROM/ strength to improve pain-free mobility and return to playing golf.      Currently in Pain?  No/denies       There.ex:  B UBE 2 min. f/b (warm-up/ no charge).   Standing sh. Flexion, abduction, ER, IR.  A/AROM 5x (PT assist)- mirror feedback. Standing 5# L sh. Pendulum/ biceps curls 20x.   Standing Nautilus: 50# lat. Pull downs/ 40# scap. Retraction/ 40# tricep ext./ 20# chest press/ 20# sh. Adduction with handles 20x each.  Reassessed supine/ seated L sh. AROM.  Manual tx.  R sidelying L scapular mobs. (all planes)- hypomobility.   Supine L shoulder AA/PROM10x all planes with static holds (pain limited). STM to L UT/  deltoid musculature 3 min.   Supine L sh. AP/inf. Mobs. Grade II-III 4x 20 sec. In supine position (pain limited/ modified).      PT Long Term Goals - 01/04/18 0926      PT LONG TERM GOAL #1   Title  Pt. I with HEP to increase L shoulder PROM to [facility] as compared to R shoulder to improve progression to resisted ex./ strength training.      Baseline  on 2/20: Pt. presents with limited L shoulder AROM (supine/ seated): flexion (123/ 50 deg.), abd. (100/ 75 deg.), ER 11 deg., IR 68 deg.  AAROM flex(118)    Time  4    Period  Weeks    Status  Partially Met      PT LONG TERM GOAL #2   Title  Pt. will reports no L shoulder pain/ tenderness with palpation to improve progression to isometrics.      Baseline  No tenderness with palpation to L sh. today.      Time  4    Period  Weeks    Status  Achieved      PT LONG TERM GOAL #3   Title  Pt. will complete QuickDASH and score <30% to improve functional mobility with UE.  Baseline  QuickDASH: 40 on 01/04/18.      Time  4    Period  Weeks    Status  Not Met      PT LONG TERM GOAL #4   Title  Pt. will increase to full L shoulder AROM/ isometrics with no increase c/o pain per MD protocol to promote return to househould/ work-related tasks.     Baseline  on 2/20: Pt. presents with limited L shoulder AROM (supine/ seated): flexion (123/ 50 deg.), abd. (100/ 75 deg.), ER 11 deg., IR 68 deg.  AAROM flex(118)    Time  4    Period  Weeks    Status  Partially Met    Target Date  02/01/18      PT LONG TERM GOAL #5   Title  Pt. able to return to playing 18 holes of golf with no increase c/o low back pain/ radicular symptoms.      Baseline  has not returned to golf at this time    Time  4    Period  Weeks    Status  On-going            Plan - 01/18/18 1253    Clinical Impression Statement  Good overall tx. session with focus on L shoulder strengthening with Nautilus machine.  L UT compensation noted but pt. working hard to promote L  sh. capsule mobility/ AROM.  L scapular mobility in R sidelying remains hypomobile.  Pt. reports no pain prior to PT or after tx. today.  Pt. is planning on using hot tub at home this afternoon.      Clinical Presentation  Stable    Clinical Decision Making  Low    Rehab Potential  Good    PT Frequency  2x / week    PT Duration  4 weeks    PT Treatment/Interventions  Aquatic Therapy;Cryotherapy;Moist Heat;Electrical Stimulation;Ultrasound;Neuromuscular re-education;Balance training;Therapeutic exercise;Manual techniques;Patient/family education;Therapeutic activities    PT Next Visit Plan  Manual therapy to improve ROM/ ER strengthening/ gross shoulder strengthening with Nautlius.      PT Home Exercise Plan  See HEP    Consulted and Agree with Plan of Care  Patient;Family member/caregiver       Patient will benefit from skilled therapeutic intervention in order to improve the following deficits and impairments:  Pain, Improper body mechanics, Decreased coordination, Decreased mobility, Decreased scar mobility, Postural dysfunction, Decreased strength, Decreased range of motion, Decreased endurance, Impaired UE functional use, Hypomobility  Visit Diagnosis: S/P left rotator cuff repair  Shoulder joint stiffness, left  Muscle weakness (generalized)  Chronic left shoulder pain     Problem List There are no active problems to display for this patient.  Pura Spice, PT, DPT # [phone] 01/18/2018, 1:55 PM  [place] [facility] [address] [place], [place], [address] Phone: [phone]   Fax:  [phone]  Name: MURAD STAPLES MRN: [id number] Date of Birth: 12-04-1940

## 2018-01-23 ENCOUNTER — Encounter: Payer: Self-pay | Admitting: Physical Therapy

## 2018-01-23 ENCOUNTER — Ambulatory Visit: Payer: Medicare Other | Admitting: Physical Therapy

## 2018-01-23 DIAGNOSIS — M6281 Muscle weakness (generalized): Secondary | ICD-10-CM

## 2018-01-23 DIAGNOSIS — Z9889 Other specified postprocedural states: Secondary | ICD-10-CM | POA: Diagnosis not present

## 2018-01-23 DIAGNOSIS — M2560 Stiffness of unspecified joint, not elsewhere classified: Secondary | ICD-10-CM

## 2018-01-23 DIAGNOSIS — M25612 Stiffness of left shoulder, not elsewhere classified: Secondary | ICD-10-CM

## 2018-01-23 DIAGNOSIS — M25512 Pain in left shoulder: Secondary | ICD-10-CM

## 2018-01-23 DIAGNOSIS — G8929 Other chronic pain: Secondary | ICD-10-CM

## 2018-01-23 NOTE — Therapy (Signed)
[facility] [address] [place], [place], [address] Phone: [phone]   Fax:  [phone]  Physical Therapy Treatment  Patient Details  Name: Sean Hubbard MRN: [id number] Date of Birth: [birth year] Referring Provider: Vonna Drafts, MD   Encounter Date: 01/23/2018  PT End of Session - 01/23/18 1244    Visit Number  22    Number of Visits  25    Date for PT Re-Evaluation  02/01/18    PT Start Time  1244    PT Stop Time  1338    PT Time Calculation (min)  54 min    Activity Tolerance  Patient tolerated treatment well;Patient limited by pain    Behavior During Therapy  [facility] for tasks assessed/performed       Past Medical History:  Diagnosis Date  . COPD (chronic obstructive pulmonary disease) ([place])   . Gout   . Hyperlipemia   . Hypertension     History reviewed. No pertinent surgical history.  There were no vitals filed for this visit.  Subjective Assessment - 01/23/18 1242    Subjective  Pt. went to pool this morning.  No new complaints.      Pertinent History  pt. known well to PT clinic.  See previous PMHx.      Limitations  Lifting;House hold activities;Other (comment)    Patient Stated Goals  Increase L shoulder ROM/ strength to improve pain-free mobility and return to playing golf.      Currently in Pain?  No/denies         There.ex:  B UBE 2 min. f/b (warm-up/ no charge).  Standing Nautilus: 60# lat. Pull downs/ 40# scap. Retraction/ 40# tricep ext./ 30# chest press/ 30# sh. Adduction with handles/  20x each. (Sharp increase in R shoulder pain with sh. Flexion assist during lat. Pull downs). Reassessed supine/ seated L sh. AROM. Standing/supine sh. Flexion, abduction, ER, IR.  A/AROM 5x (PT assist). Standing 6# L/R bicep curls 20x.   Supine wt. Wand shoulder flexion/ press-ups 10x2.   L sh. Rhythmic stabs 3x 30 sec. (difficulty with resisted flexion).    Manual tx.   Supine L shoulder AA/PROM10x  all planes with static holds (pain limited). STM to L UT/ deltoid musculature 3 min.   Supine L sh. AP/inf. Mobs. Grade II-III 4x 20 sec. In supine position (pain limited/ modified).   No ice today.     PT Long Term Goals - 01/04/18 0926      PT LONG TERM GOAL #1   Title  Pt. I with HEP to increase L shoulder PROM to [facility] as compared to R shoulder to improve progression to resisted ex./ strength training.      Baseline  on 2/20: Pt. presents with limited L shoulder AROM (supine/ seated): flexion (123/ 50 deg.), abd. (100/ 75 deg.), ER 11 deg., IR 68 deg.  AAROM flex(118)    Time  4    Period  Weeks    Status  Partially Met      PT LONG TERM GOAL #2   Title  Pt. will reports no L shoulder pain/ tenderness with palpation to improve progression to isometrics.      Baseline  No tenderness with palpation to L sh. today.      Time  4    Period  Weeks    Status  Achieved      PT LONG TERM GOAL #3   Title  Pt. will complete QuickDASH  and score <30% to improve functional mobility with UE.      Baseline  QuickDASH: 40 on 01/04/18.      Time  4    Period  Weeks    Status  Not Met      PT LONG TERM GOAL #4   Title  Pt. will increase to full L shoulder AROM/ isometrics with no increase c/o pain per MD protocol to promote return to househould/ work-related tasks.     Baseline  on 2/20: Pt. presents with limited L shoulder AROM (supine/ seated): flexion (123/ 50 deg.), abd. (100/ 75 deg.), ER 11 deg., IR 68 deg.  AAROM flex(118)    Time  4    Period  Weeks    Status  Partially Met    Target Date  02/01/18      PT LONG TERM GOAL #5   Title  Pt. able to return to playing 18 holes of golf with no increase c/o low back pain/ radicular symptoms.      Baseline  has not returned to golf at this time    Time  4    Period  Weeks    Status  On-going            Plan - 01/23/18 1244    Clinical Impression Statement  Minimal cuing today to keep L shoulder/ UT muscle depressed today during  resisted ex.  Significant increase in L shoulder soreness/ discomfort (5/10) with standing Nautilus/ overhead tasks.  PT continues to focus on increasing L sh. capsule mobility/ flexion in a pain tolerable range.      Clinical Presentation  Stable    Clinical Decision Making  Low    Rehab Potential  Good    PT Frequency  2x / week    PT Duration  4 weeks    PT Treatment/Interventions  Aquatic Therapy;Cryotherapy;Moist Heat;Electrical Stimulation;Ultrasound;Neuromuscular re-education;Balance training;Therapeutic exercise;Manual techniques;Patient/family education;Therapeutic activities    PT Next Visit Plan  Manual therapy to improve ROM/ ER strengthening/ gross shoulder strengthening with Nautlius.      PT Home Exercise Plan  See HEP    Consulted and Agree with Plan of Care  Patient;Family member/caregiver       Patient will benefit from skilled therapeutic intervention in order to improve the following deficits and impairments:  Pain, Improper body mechanics, Decreased coordination, Decreased mobility, Decreased scar mobility, Postural dysfunction, Decreased strength, Decreased range of motion, Decreased endurance, Impaired UE functional use, Hypomobility  Visit Diagnosis: S/P left rotator cuff repair  Shoulder joint stiffness, left  Muscle weakness (generalized)  Chronic left shoulder pain  Joint stiffness     Problem List There are no active problems to display for this patient.  Pura Spice, PT, DPT # [phone] 01/23/2018, 1:41 PM  [facility] [address] [place], [place], [address] Phone: [phone]   Fax:  [phone]  Name: Sean Hubbard MRN: [id number] Date of Birth: 04-23-1941

## 2018-01-25 ENCOUNTER — Encounter: Payer: Medicare Other | Admitting: Physical Therapy

## 2018-01-25 ENCOUNTER — Ambulatory Visit: Payer: Medicare Other | Admitting: Physical Therapy

## 2018-01-25 ENCOUNTER — Encounter: Payer: Self-pay | Admitting: Physical Therapy

## 2018-01-25 DIAGNOSIS — G8929 Other chronic pain: Secondary | ICD-10-CM

## 2018-01-25 DIAGNOSIS — M25512 Pain in left shoulder: Secondary | ICD-10-CM

## 2018-01-25 DIAGNOSIS — M25612 Stiffness of left shoulder, not elsewhere classified: Secondary | ICD-10-CM

## 2018-01-25 DIAGNOSIS — M6281 Muscle weakness (generalized): Secondary | ICD-10-CM

## 2018-01-25 DIAGNOSIS — Z9889 Other specified postprocedural states: Secondary | ICD-10-CM | POA: Diagnosis not present

## 2018-01-25 NOTE — Therapy (Addendum)
[facility] [address] [place], [place], [address] Phone: [phone]   Fax:  [phone]  Physical Therapy Treatment  Patient Details  Name: Sean Hubbard MRN: [id number] Date of Birth: 12-08-1940 Referring Provider: Vonna Drafts, MD   Encounter Date: 01/25/2018     Treatment: 23 out of 25.    Recert: 5/85/27   Past Medical History:  Diagnosis Date  . COPD (chronic obstructive pulmonary disease) ([place])   . Gout   . Hyperlipemia   . Hypertension     History reviewed. No pertinent surgical history.  There were no vitals filed for this visit.     Pt. planning on going fishing this afternoon. No new complaints. L shoulder remains tight with overhead reaching.       There.ex:  B UBE 2 min. f/b(warm-up/ no charge).  Standing Nautilus:60# lat. Pull downs/40#scap. Retraction/40# tricep ext./30# chest press/ 30# sh. Adduction with handles/ 20x each. (Sharp increase in R shoulder pain with sh. Flexion assist during lat. Pull downs). Standing 6# L/R bicep curls 20x. Supine wt. Wand shoulder flexion/ press-ups 10x2.     Manual tx.  Supine L sh. AP/inf. Mobs. Grade II-III 4x 20 sec. In supine position (pain limited/ modified). R sidelying L scapular mobs. Supine L shoulder AA/PROM10x all planes with static holds (pain limited). STM to L UT/ deltoid musculature3 min.   No ice today.         L shoulder remains limited due to pain/ significant hypomobility in joint capsule. No tenderness with palpation around deltoid/ biceps region. PT is focusing on progressing L shoulder flexion/ resisted ex. to promote increase functional mobility and return to golf.    PT Long Term Goals - 01/04/18 0926      PT LONG TERM GOAL #1   Title  Pt. I with HEP to increase L shoulder PROM to [facility] as compared to R shoulder to improve progression to resisted ex./ strength training.      Baseline  on 2/20: Pt. presents  with limited L shoulder AROM (supine/ seated): flexion (123/ 50 deg.), abd. (100/ 75 deg.), ER 11 deg., IR 68 deg.  AAROM flex(118)    Time  4    Period  Weeks    Status  Partially Met      PT LONG TERM GOAL #2   Title  Pt. will reports no L shoulder pain/ tenderness with palpation to improve progression to isometrics.      Baseline  No tenderness with palpation to L sh. today.      Time  4    Period  Weeks    Status  Achieved      PT LONG TERM GOAL #3   Title  Pt. will complete QuickDASH and score <30% to improve functional mobility with UE.      Baseline  QuickDASH: 40 on 01/04/18.      Time  4    Period  Weeks    Status  Not Met      PT LONG TERM GOAL #4   Title  Pt. will increase to full L shoulder AROM/ isometrics with no increase c/o pain per MD protocol to promote return to househould/ work-related tasks.     Baseline  on 2/20: Pt. presents with limited L shoulder AROM (supine/ seated): flexion (123/ 50 deg.), abd. (100/ 75 deg.), ER 11 deg., IR 68 deg.  AAROM flex(118)    Time  4    Period  Weeks  Status  Partially Met    Target Date  02/01/18      PT LONG TERM GOAL #5   Title  Pt. able to return to playing 18 holes of golf with no increase c/o low back pain/ radicular symptoms.      Baseline  has not returned to golf at this time    Time  4    Period  Weeks    Status  On-going              Patient will benefit from skilled therapeutic intervention in order to improve the following deficits and impairments:  Pain, Improper body mechanics, Decreased coordination, Decreased mobility, Decreased scar mobility, Postural dysfunction, Decreased strength, Decreased range of motion, Decreased endurance, Impaired UE functional use, Hypomobility  Visit Diagnosis: S/P left rotator cuff repair  Shoulder joint stiffness, left  Muscle weakness (generalized)  Chronic left shoulder pain     Problem List There are no active problems to display for this  patient.  Pura Spice, PT, DPT # [id number] 01/31/2018, 5:35 PM  [facility] [address] [place], [place], [address] Phone: [phone]   Fax:  [phone]  Name: Sean Hubbard MRN: [id number] Date of Birth: 1941-05-13

## 2018-01-31 ENCOUNTER — Ambulatory Visit: Payer: Medicare Other | Admitting: Physical Therapy

## 2018-01-31 DIAGNOSIS — M25612 Stiffness of left shoulder, not elsewhere classified: Secondary | ICD-10-CM

## 2018-01-31 DIAGNOSIS — Z9889 Other specified postprocedural states: Secondary | ICD-10-CM

## 2018-01-31 DIAGNOSIS — M6281 Muscle weakness (generalized): Secondary | ICD-10-CM

## 2018-01-31 DIAGNOSIS — G8929 Other chronic pain: Secondary | ICD-10-CM

## 2018-01-31 DIAGNOSIS — M25512 Pain in left shoulder: Secondary | ICD-10-CM

## 2018-02-02 ENCOUNTER — Ambulatory Visit: Payer: Medicare Other | Admitting: Physical Therapy

## 2018-02-02 DIAGNOSIS — Z9889 Other specified postprocedural states: Secondary | ICD-10-CM

## 2018-02-02 DIAGNOSIS — M6281 Muscle weakness (generalized): Secondary | ICD-10-CM

## 2018-02-02 DIAGNOSIS — M25612 Stiffness of left shoulder, not elsewhere classified: Secondary | ICD-10-CM

## 2018-02-02 DIAGNOSIS — G8929 Other chronic pain: Secondary | ICD-10-CM

## 2018-02-02 DIAGNOSIS — M25512 Pain in left shoulder: Secondary | ICD-10-CM

## 2018-02-02 DIAGNOSIS — M2560 Stiffness of unspecified joint, not elsewhere classified: Secondary | ICD-10-CM

## 2018-02-05 NOTE — Therapy (Signed)
[facility] [address] [place], [place], [address] Phone: [phone]   Fax:  [phone]  Physical Therapy Treatment  Patient Details  Name: Sean Hubbard MRN: [id number] Date of Birth: 1941/05/23 Referring Provider: Vonna Drafts, MD   Encounter Date: 01/31/2018  PT End of Session - 02/05/18 1909    Visit Number  24    Number of Visits  25    Date for PT Re-Evaluation  02/01/18    PT Start Time  0940    PT Stop Time  1037    PT Time Calculation (min)  57 min    Activity Tolerance  Patient tolerated treatment well;Patient limited by pain    Behavior During Therapy  [facility] for tasks assessed/performed       Past Medical History:  Diagnosis Date  . COPD (chronic obstructive pulmonary disease) ([place])   . Gout   . Hyperlipemia   . Hypertension     No past surgical history on file.  There were no vitals filed for this visit.  Subjective Assessment - 02/05/18 1904    Subjective  Pt. reports L shoulder stiffness/ soreness this morning.  Pt. reports he hasn't done his exercises this morning.      Pertinent History  pt. known well to PT clinic.  See previous PMHx.      Limitations  Lifting;House hold activities;Other (comment)    Patient Stated Goals  Increase L shoulder ROM/ strength to improve pain-free mobility and return to playing golf.      Currently in Pain?  Yes    Pain Score  3     Pain Location  Shoulder    Pain Orientation  Left    Pain Descriptors / Indicators  Sore;Aching         There.ex:  B UBE 2 min. f/b(warm-up/ no charge).  Standing Nautilus:60# lat. Pull downs/40#scap. Retraction/40# tricep ext./30# chest press/30# sh. Adduction with handles/20x each.  Standing6# L/R bicepcurls 20x. Supine wt. Wand shoulder flexion/ press-ups/tricep ext 10x2. Supine L shoulder rhythmic stabs (all planes)- min. To mod. Resistance, esp. With sh. Flexion 5x 30 sec.   Manual tx.  Supine L sh.  AP/inf. Mobs. Grade II-III 4x 20 sec. In supine position (pain limited/ modified). R sidelying L scapular mobs. Supine L shoulder AA/PROM10x all planes with static holds (pain limited). STM to L UT/ deltoid musculature3 min.   Ice to L shoulder in seated position after tx.         PT Long Term Goals - 01/04/18 0926      PT LONG TERM GOAL #1   Title  Pt. I with HEP to increase L shoulder PROM to [facility] as compared to R shoulder to improve progression to resisted ex./ strength training.      Baseline  on 2/20: Pt. presents with limited L shoulder AROM (supine/ seated): flexion (123/ 50 deg.), abd. (100/ 75 deg.), ER 11 deg., IR 68 deg.  AAROM flex(118)    Time  4    Period  Weeks    Status  Partially Met      PT LONG TERM GOAL #2   Title  Pt. will reports no L shoulder pain/ tenderness with palpation to improve progression to isometrics.      Baseline  No tenderness with palpation to L sh. today.      Time  4    Period  Weeks    Status  Achieved      PT  LONG TERM GOAL #3   Title  Pt. will complete QuickDASH and score <30% to improve functional mobility with UE.      Baseline  QuickDASH: 40 on 01/04/18.      Time  4    Period  Weeks    Status  Not Met      PT LONG TERM GOAL #4   Title  Pt. will increase to full L shoulder AROM/ isometrics with no increase c/o pain per MD protocol to promote return to househould/ work-related tasks.     Baseline  on 2/20: Pt. presents with limited L shoulder AROM (supine/ seated): flexion (123/ 50 deg.), abd. (100/ 75 deg.), ER 11 deg., IR 68 deg.  AAROM flex(118)    Time  4    Period  Weeks    Status  Partially Met    Target Date  02/01/18      PT LONG TERM GOAL #5   Title  Pt. able to return to playing 18 holes of golf with no increase c/o low back pain/ radicular symptoms.      Baseline  has not returned to golf at this time    Time  4    Period  Weeks    Status  On-going            Plan - 02/05/18 1910    Clinical  Impression Statement  Significant L shoulder joint stiffness with AP/inferior mobs. in supine position.  Pt. able to actively flexion R shoulder to around 90 deg. but pain limited/ increase lumbar extension (compensatory movement patterns).  Slow progress with L UE strengthening due to pain/ joint stiffness.  Pt. easily fatigued with Nautilus ex.       Clinical Presentation  Stable    Clinical Decision Making  Low    Rehab Potential  Good    PT Frequency  2x / week    PT Duration  4 weeks    PT Treatment/Interventions  Aquatic Therapy;Cryotherapy;Moist Heat;Electrical Stimulation;Ultrasound;Neuromuscular re-education;Balance training;Therapeutic exercise;Manual techniques;Patient/family education;Therapeutic activities    PT Next Visit Plan  Manual therapy to improve ROM/ ER strengthening/ gross shoulder strengthening with Nautlius.      PT Home Exercise Plan  See HEP    Consulted and Agree with Plan of Care  Patient;Family member/caregiver       Patient will benefit from skilled therapeutic intervention in order to improve the following deficits and impairments:  Pain, Improper body mechanics, Decreased coordination, Decreased mobility, Decreased scar mobility, Postural dysfunction, Decreased strength, Decreased range of motion, Decreased endurance, Impaired UE functional use, Hypomobility  Visit Diagnosis: S/P left rotator cuff repair  Shoulder joint stiffness, left  Muscle weakness (generalized)  Chronic left shoulder pain     Problem List There are no active problems to display for this patient.  Pura Spice, PT, DPT # [phone] 02/05/2018, 7:24 PM  [facility] [address] [place], [place], [address] Phone: [phone]   Fax:  [phone]  Name: Sean Hubbard MRN: [id number] Date of Birth: 1940-12-23

## 2018-02-06 ENCOUNTER — Ambulatory Visit: Payer: Medicare Other | Admitting: Physical Therapy

## 2018-02-06 ENCOUNTER — Encounter: Payer: Self-pay | Admitting: Physical Therapy

## 2018-02-07 ENCOUNTER — Ambulatory Visit: Payer: Medicare Other | Admitting: Physical Therapy

## 2018-02-07 DIAGNOSIS — M25612 Stiffness of left shoulder, not elsewhere classified: Secondary | ICD-10-CM

## 2018-02-07 DIAGNOSIS — G8929 Other chronic pain: Secondary | ICD-10-CM

## 2018-02-07 DIAGNOSIS — M25512 Pain in left shoulder: Secondary | ICD-10-CM

## 2018-02-07 DIAGNOSIS — M6281 Muscle weakness (generalized): Secondary | ICD-10-CM

## 2018-02-07 DIAGNOSIS — Z9889 Other specified postprocedural states: Secondary | ICD-10-CM

## 2018-02-07 DIAGNOSIS — M2560 Stiffness of unspecified joint, not elsewhere classified: Secondary | ICD-10-CM

## 2018-02-08 ENCOUNTER — Encounter: Payer: Medicare Other | Admitting: Physical Therapy

## 2018-02-09 ENCOUNTER — Encounter: Payer: Medicare Other | Admitting: Physical Therapy

## 2018-02-09 ENCOUNTER — Encounter: Payer: Self-pay | Admitting: Physical Therapy

## 2018-02-09 NOTE — Therapy (Signed)
[facility] [address] [place], [place], [address] Phone: [phone]   Fax:  [phone]  Physical Therapy Treatment  Patient Details  Name: GAELAN GLENNON MRN: [id number] Date of Birth: 31-Aug-1941 Referring Provider: Vonna Drafts, MD   Encounter Date: 02/02/2018    Treatment 25 out of 33.  Recert date: 4/98/26   Past Medical History:  Diagnosis Date  . COPD (chronic obstructive pulmonary disease) ([place])   . Gout   . Hyperlipemia   . Hypertension     History reviewed. No pertinent surgical history.  There were no vitals filed for this visit.    Pt. is hoping to hit some golf balls this weekend to see how shoulder feels. No new complaints at this time.      There.ex:  B UBE 2 min. f/b(warm-up/ no charge).  Standing Nautilus:60# lat. Pull downs/40#scap. Retraction/40# tricep ext./30# chest press/30# sh. Adduction with handles/20x each.  Supine wt. Wand shoulder flexion/ press-ups/tricep ext 10x2.Standing wt. Wand: ER/ext./ flexion 10x2.   Supine L shoulder rhythmic stabs (all planes)- min. To mod. Resistance, esp. With sh. Flexion 5x 30 sec.   Manual tx.  Supine L sh. AP/inf. Mobs. Grade II-III 4x 20 sec. In supine position (pain limited/ modified). R sidelying L scapular mobs. Supine L shoulder AA/PROM10x all planes with static holds (pain limited).   No ice to L sh. Today.         Supine L shoulder AROM: flexion 132 deg., abd. 110 deg., ER 31 deg., IR 74 deg. Seated L shoulder AROM: flexion 88 deg., abd. 82 deg. Significant L shoulder joint tightness. Pt. progressing with L shoulder strengthening ex. program but limited by L sh. joint stiffness/ decrease AROM. Pt. working hard during PT tx. session and has returned to fishing but has not tried to golf at this time due to sh. limitations. Pt. will benefit from continued PT services to increase L shoulder AROM    PT Long Term Goals  - 02/09/18 1249      PT LONG TERM GOAL #1   Title  Pt. I with HEP to increase L shoulder PROM to [facility] as compared to R shoulder to improve progression to resisted ex./ strength training.      Baseline  Supine L shoulder AROM: flexion 132 deg., abd. 110 deg., ER 31 deg., IR 74 deg. Seated L shoulder AROM: flexion 88 deg., abd. 82 deg    Time  4    Period  Weeks    Status  Partially Met    Target Date  03/02/18      PT LONG TERM GOAL #2   Title  Pt. will reports no L shoulder pain/ tenderness with palpation to improve progression to isometrics.      Baseline  No tenderness with palpation to L sh. today.      Time  4    Period  Weeks    Status  Achieved    Target Date  11/29/17      PT LONG TERM GOAL #3   Title  Pt. will complete QuickDASH and score <30% to improve functional mobility with UE.      Baseline  QuickDASH: 40 on 01/04/18.      Time  4    Period  Weeks    Status  Not Met    Target Date  03/02/18      PT LONG TERM GOAL #4   Title  Pt. will increase to full L  shoulder AROM/ isometrics with no increase c/o pain per MD protocol to promote return to househould/ work-related tasks.     Baseline  Supine L shoulder AROM: flexion 132 deg., abd. 110 deg., ER 31 deg., IR 74 deg. Seated L shoulder AROM: flexion 88 deg., abd. 82 deg    Time  4    Period  Weeks    Status  Partially Met    Target Date  03/02/18      PT LONG TERM GOAL #5   Title  Pt. able to return to playing 18 holes of golf with no increase c/o L shoulder issues/ radicular symptoms.      Baseline  has not returned to golf at this time    Time  4    Period  Weeks    Status  Not Met    Target Date  03/02/18        Patient will benefit from skilled therapeutic intervention in order to improve the following deficits and impairments:  Pain, Improper body mechanics, Decreased coordination, Decreased mobility, Decreased scar mobility, Postural dysfunction, Decreased strength, Decreased range of motion, Decreased  endurance, Impaired UE functional use, Hypomobility  Visit Diagnosis: S/P left rotator cuff repair  Shoulder joint stiffness, left  Muscle weakness (generalized)  Chronic left shoulder pain  Joint stiffness     Problem List There are no active problems to display for this patient.  Pura Spice, PT, DPT # [phone] 02/09/2018, 12:59 PM  [place] [address] [place], [place], [address] Phone: [phone]   Fax:  [phone]  Name: PURVIS SIDLE MRN: [id number] Date of Birth: [birth year]

## 2018-02-09 NOTE — Therapy (Addendum)
[facility] [address] [place], [place], [address] Phone: [phone]   Fax:  [phone]  Physical Therapy Treatment  Patient Details  Name: Sean Hubbard MRN: [id number] Date of Birth: Dec 07, 1940 Referring Provider: Vonna Drafts, MD   Encounter Date: 02/07/2018  PT End of Session - 02/09/18 1313    Visit Number  26    Number of Visits  33    Date for PT Re-Evaluation  03/02/18    PT Start Time  1336    PT Stop Time  1427    PT Time Calculation (min)  51 min    Activity Tolerance  Patient tolerated treatment well;Patient limited by pain    Behavior During Therapy  [facility] for tasks assessed/performed       Past Medical History:  Diagnosis Date  . COPD (chronic obstructive pulmonary disease) ([place])   . Gout   . Hyperlipemia   . Hypertension     History reviewed. No pertinent surgical history.  There were no vitals filed for this visit.  Subjective Assessment - 02/09/18 1311    Pertinent History  pt. known well to PT clinic.  See previous PMHx.      Limitations  Lifting;House hold activities;Other (comment)    Patient Stated Goals  Increase L shoulder ROM/ strength to improve pain-free mobility and return to playing golf.      Currently in Pain?  Yes    Pain Score  3     Pain Location  Shoulder    Pain Orientation  Left    Pain Descriptors / Indicators  Aching;Discomfort         [facility] PT Assessment - 02/09/18 0001      Assessment   Medical Diagnosis  L RTC repair/ L shoulder pain.     Referring Provider  Vonna Drafts, MD    Onset Date/Surgical Date  10/04/17    Hand Dominance  Right        Pt. states he is sore/ hurting in L shoulder today.  Pt. states he is doing HEP but limited with sh. flexion ex       There.ex:  B UBE 2 min. f/b(warm-up/ no charge).  Standing Nautilus:60# lat. Pull downs/40#scap. Retraction/40# tricep ext./30# chest press/30# sh. Adduction with handles/ 30# sh. Ext. With  wand20x each.  Standing 5# bicep curls/ pron./ supination 10x2.   Supine wt. Wand shoulder flexion/ press-ups/tricep ext10x2.Standing wt. Wand: ER/ext./ flexion 10x2.   Supine L shoulder rhythmic stabs (all planes)- min. To mod. Resistance, esp. With sh. Flexion 5x 30 sec. Supine manual isometrics to L sh. ER/IR 5x10 sec. (pain tolerable). Reviewed golf swing in clinic Discussed HEP     PT Education - 02/09/18 1313    Education provided  Yes    Education Details  GTB: lat. pull downs/ tricep ext./ scap. retraction    Person(s) Educated  Patient    Methods  Explanation;Demonstration;Handout    Comprehension  Verbalized understanding;Returned demonstration          PT Long Term Goals - 02/09/18 1249      PT LONG TERM GOAL #1   Title  Pt. I with HEP to increase L shoulder PROM to [facility] as compared to R shoulder to improve progression to resisted ex./ strength training.      Baseline  Supine L shoulder AROM: flexion 132 deg., abd. 110 deg., ER 31 deg., IR 74 deg. Seated L shoulder AROM: flexion 88 deg., abd. 82 deg  Time  4    Period  Weeks    Status  Partially Met    Target Date  03/02/18      PT LONG TERM GOAL #2   Title  Pt. will reports no L shoulder pain/ tenderness with palpation to improve progression to isometrics.      Baseline  No tenderness with palpation to L sh. today.      Time  4    Period  Weeks    Status  Achieved    Target Date  11/29/17      PT LONG TERM GOAL #3   Title  Pt. will complete QuickDASH and score <30% to improve functional mobility with UE.      Baseline  QuickDASH: 40 on 01/04/18.      Time  4    Period  Weeks    Status  Not Met    Target Date  03/02/18      PT LONG TERM GOAL #4   Title  Pt. will increase to full L shoulder AROM/ isometrics with no increase c/o pain per MD protocol to promote return to househould/ work-related tasks.     Baseline  Supine L shoulder AROM: flexion 132 deg., abd. 110 deg., ER 31 deg., IR 74 deg.  Seated L shoulder AROM: flexion 88 deg., abd. 82 deg    Time  4    Period  Weeks    Status  Partially Met    Target Date  03/02/18      PT LONG TERM GOAL #5   Title  Pt. able to return to playing 18 holes of golf with no increase c/o L shoulder issues/ radicular symptoms.      Baseline  has not returned to golf at this time    Time  4    Period  Weeks    Status  Not Met    Target Date  03/02/18            Plan - 02/09/18 1314    Clinical Impression Statement  PT continues to push L shoulder strengthening in pain tolerable range with no change in resistance at this time.  Slight L UT compensation with overhead reaching for handles on Nautilus.  Good L scapular movement with AROM in R sidelying/ seated posture.      Clinical Presentation  Stable    Clinical Decision Making  Low    Rehab Potential  Good    PT Frequency  2x / week    PT Duration  4 weeks    PT Treatment/Interventions  Aquatic Therapy;Cryotherapy;Moist Heat;Electrical Stimulation;Ultrasound;Neuromuscular re-education;Balance training;Therapeutic exercise;Manual techniques;Patient/family education;Therapeutic activities    PT Next Visit Plan  Manual therapy to improve ROM/ gross shoulder strengthening with Nautlius.         Patient will benefit from skilled therapeutic intervention in order to improve the following deficits and impairments:  Pain, Improper body mechanics, Decreased coordination, Decreased mobility, Decreased scar mobility, Postural dysfunction, Decreased strength, Decreased range of motion, Decreased endurance, Impaired UE functional use, Hypomobility  Visit Diagnosis: S/P left rotator cuff repair  Shoulder joint stiffness, left  Muscle weakness (generalized)  Chronic left shoulder pain  Joint stiffness     Problem List There are no active problems to display for this patient.  Pura Spice, PT, DPT # [phone] 02/09/2018, 2:21 PM  [place] [facility] [address] [place], [place], [address] Phone: [phone]   Fax:  [phone]  Name:  Sean Hubbard MRN: [id number] Date of Birth: October 24, 1941

## 2018-02-13 ENCOUNTER — Ambulatory Visit: Payer: Medicare Other | Attending: Orthopedic Surgery | Admitting: Physical Therapy

## 2018-02-13 DIAGNOSIS — M6281 Muscle weakness (generalized): Secondary | ICD-10-CM | POA: Insufficient documentation

## 2018-02-13 DIAGNOSIS — G8929 Other chronic pain: Secondary | ICD-10-CM | POA: Diagnosis present

## 2018-02-13 DIAGNOSIS — M25612 Stiffness of left shoulder, not elsewhere classified: Secondary | ICD-10-CM | POA: Diagnosis present

## 2018-02-13 DIAGNOSIS — M25512 Pain in left shoulder: Secondary | ICD-10-CM | POA: Insufficient documentation

## 2018-02-13 DIAGNOSIS — M2560 Stiffness of unspecified joint, not elsewhere classified: Secondary | ICD-10-CM | POA: Insufficient documentation

## 2018-02-13 DIAGNOSIS — M5442 Lumbago with sciatica, left side: Secondary | ICD-10-CM | POA: Diagnosis present

## 2018-02-13 DIAGNOSIS — R293 Abnormal posture: Secondary | ICD-10-CM | POA: Insufficient documentation

## 2018-02-13 DIAGNOSIS — Z9889 Other specified postprocedural states: Secondary | ICD-10-CM | POA: Diagnosis not present

## 2018-02-15 ENCOUNTER — Ambulatory Visit: Payer: Medicare Other | Admitting: Physical Therapy

## 2018-02-15 DIAGNOSIS — M6281 Muscle weakness (generalized): Secondary | ICD-10-CM

## 2018-02-15 DIAGNOSIS — M25612 Stiffness of left shoulder, not elsewhere classified: Secondary | ICD-10-CM

## 2018-02-15 DIAGNOSIS — Z9889 Other specified postprocedural states: Secondary | ICD-10-CM | POA: Diagnosis not present

## 2018-02-15 DIAGNOSIS — M25512 Pain in left shoulder: Secondary | ICD-10-CM

## 2018-02-15 DIAGNOSIS — G8929 Other chronic pain: Secondary | ICD-10-CM

## 2018-02-20 ENCOUNTER — Ambulatory Visit: Payer: Medicare Other | Admitting: Physical Therapy

## 2018-02-20 DIAGNOSIS — M6281 Muscle weakness (generalized): Secondary | ICD-10-CM

## 2018-02-20 DIAGNOSIS — Z9889 Other specified postprocedural states: Secondary | ICD-10-CM

## 2018-02-20 DIAGNOSIS — M25512 Pain in left shoulder: Secondary | ICD-10-CM

## 2018-02-20 DIAGNOSIS — G8929 Other chronic pain: Secondary | ICD-10-CM

## 2018-02-20 DIAGNOSIS — M25612 Stiffness of left shoulder, not elsewhere classified: Secondary | ICD-10-CM

## 2018-02-22 ENCOUNTER — Ambulatory Visit: Payer: Medicare Other | Admitting: Physical Therapy

## 2018-02-22 DIAGNOSIS — M25612 Stiffness of left shoulder, not elsewhere classified: Secondary | ICD-10-CM

## 2018-02-22 DIAGNOSIS — M25512 Pain in left shoulder: Secondary | ICD-10-CM

## 2018-02-22 DIAGNOSIS — Z9889 Other specified postprocedural states: Secondary | ICD-10-CM | POA: Diagnosis not present

## 2018-02-22 DIAGNOSIS — G8929 Other chronic pain: Secondary | ICD-10-CM

## 2018-02-22 DIAGNOSIS — M6281 Muscle weakness (generalized): Secondary | ICD-10-CM

## 2018-02-23 NOTE — Therapy (Signed)
[facility] [address] [place], [place], [address] Phone: [phone]   Fax:  [phone]  Physical Therapy Treatment  Patient Details  Name: Sean Hubbard MRN: [id number] Date of Birth: 1941-10-06 Referring Provider: Vonna Drafts, MD   Encounter Date: 02/13/2018  PT End of Session - 02/23/18 1604    Visit Number  27    Number of Visits  33    Date for PT Re-Evaluation  03/02/18    PT Start Time  7482    PT Stop Time  1521    PT Time Calculation (min)  61 min    Activity Tolerance  Patient tolerated treatment well;Patient limited by pain    Behavior During Therapy  [facility] for tasks assessed/performed       Past Medical History:  Diagnosis Date  . COPD (chronic obstructive pulmonary disease) ([place])   . Gout   . Hyperlipemia   . Hypertension     No past surgical history on file.  There were no vitals filed for this visit.  Subjective Assessment - 02/23/18 1556    Subjective  Pt. has not hit any golf balls yet.  Pt. has been enjoying fishing and starting some yardwork.  L shoulder remains sore/ pain limited.      Pertinent History  pt. known well to PT clinic.  See previous PMHx.      Limitations  Lifting;House hold activities;Other (comment)    Patient Stated Goals  Increase L shoulder ROM/ strength to improve pain-free mobility and return to playing golf.      Currently in Pain?  Yes    Pain Score  2     Pain Location  Shoulder    Pain Orientation  Left    Pain Descriptors / Indicators  Aching;Discomfort    Pain Type  Surgical pain    Pain Onset  More than a month ago        There.ex:  B UBE 2 min. f/b(warm-up/ no charge).  2nd shelf cone stacking: 70" shelf with 10 cones x 2.  L UE shoulder flexion (compensation noted). Standing Nautilus:60# lat. Pull downs/40#scap. Retraction/40# tricep ext./30# chest press/30# sh. Adduction with handles/ 30# sh. Ext. With wand20x each.  Supine 4# ex.:  press-ups/tricep ext10x2.Standing wt. Wand: ER/ext./ flexion 10x2. Supine L shoulder rhythmic stabs (all planes)- min. To mod. Resistance, esp. With sh. Flexion 5x 30 sec. Supine manual isometrics to L sh. ER/IR 5x10 sec. (pain tolerable). Supine L shoulder A/AROM all planes with static holds.  Golf swing/ analysis outside with plastic golf balls. Encouraged pt. To return to hitting a bucket of balls at golf range.      PT Education - 02/23/18 1603    Education provided  Yes    Education Details  Golf swing analysis    Person(s) Educated  Patient    Methods  Explanation;Demonstration    Comprehension  Verbalized understanding;Returned demonstration          PT Long Term Goals - 02/09/18 1249      PT LONG TERM GOAL #1   Title  Pt. I with HEP to increase L shoulder PROM to [facility] as compared to R shoulder to improve progression to resisted ex./ strength training.      Baseline  Supine L shoulder AROM: flexion 132 deg., abd. 110 deg., ER 31 deg., IR 74 deg. Seated L shoulder AROM: flexion 88 deg., abd. 82 deg    Time  4    Period  Weeks    Status  Partially Met    Target Date  03/02/18      PT LONG TERM GOAL #2   Title  Pt. will reports no L shoulder pain/ tenderness with palpation to improve progression to isometrics.      Baseline  No tenderness with palpation to L sh. today.      Time  4    Period  Weeks    Status  Achieved    Target Date  11/29/17      PT LONG TERM GOAL #3   Title  Pt. will complete QuickDASH and score <30% to improve functional mobility with UE.      Baseline  QuickDASH: 40 on 01/04/18.      Time  4    Period  Weeks    Status  Not Met    Target Date  03/02/18      PT LONG TERM GOAL #4   Title  Pt. will increase to full L shoulder AROM/ isometrics with no increase c/o pain per MD protocol to promote return to househould/ work-related tasks.     Baseline  Supine L shoulder AROM: flexion 132 deg., abd. 110 deg., ER 31 deg., IR 74 deg. Seated L  shoulder AROM: flexion 88 deg., abd. 82 deg    Time  4    Period  Weeks    Status  Partially Met    Target Date  03/02/18      PT LONG TERM GOAL #5   Title  Pt. able to return to playing 18 holes of golf with no increase c/o L shoulder issues/ radicular symptoms.      Baseline  has not returned to golf at this time    Time  4    Period  Weeks    Status  Not Met    Target Date  03/02/18         Plan - 02/23/18 1604    Clinical Impression Statement  Pt. has an abbreviated golf swing with limited L shoulder horizontal adduction.  No increase c/o L shoulder pain but limited grasp due to finger swelling/ arthritis.  Pt. requires extra time/ L UT and lumbar extension compensation with cone stacking on 2nd shelf.  No tenderness with palpation over L deltoid musculature but pec. minor tightness/ tenderness at insertion during supine L shoulder stretches.  Pt. encouraged to hit a bucket of golf ball on range prior to next tx. session.        Clinical Presentation  Stable    Clinical Decision Making  Low    Rehab Potential  Good    PT Frequency  2x / week    PT Duration  4 weeks    PT Next Visit Plan  Manual therapy to improve ROM/ gross shoulder strengthening with Nautlius.  Return to [place]  See HEP    Consulted and Agree with Plan of Care  Patient;Family member/caregiver       Patient will benefit from skilled therapeutic intervention in order to improve the following deficits and impairments:  Pain, Improper body mechanics, Decreased coordination, Decreased mobility, Decreased scar mobility, Postural dysfunction, Decreased strength, Decreased range of motion, Decreased endurance, Impaired UE functional use, Hypomobility  Visit Diagnosis: S/P left rotator cuff repair  Shoulder joint stiffness, left  Muscle weakness (generalized)  Chronic left shoulder pain     Problem List There are no active problems to display for this patient.  Pura Spice,  PT, DPT # [phone] 02/23/2018, 4:16 PM  [facility] [address] [place], [place], [address] Phone: [phone]   Fax:  [phone]  Name: Sean Hubbard MRN: [id number] Date of Birth: 15-Aug-1941

## 2018-02-24 NOTE — Therapy (Signed)
[facility] [address] [place], [place], [address] Phone: [phone]   Fax:  [phone]  Physical Therapy Treatment  Patient Details  Name: Sean Hubbard MRN: [id number] Date of Birth: 12-13-1940 Referring Provider: Vonna Drafts, MD   Encounter Date: 02/15/2018  PT End of Session - 02/24/18 1400    Visit Number  28    Number of Visits  33    Date for PT Re-Evaluation  03/02/18    PT Start Time  6160    PT Stop Time  1515    PT Time Calculation (min)  56 min    Activity Tolerance  Patient tolerated treatment well;Patient limited by pain    Behavior During Therapy  [facility] for tasks assessed/performed       Past Medical History:  Diagnosis Date  . COPD (chronic obstructive pulmonary disease) ([place])   . Gout   . Hyperlipemia   . Hypertension     No past surgical history on file.  There were no vitals filed for this visit.  Subjective Assessment - 02/24/18 1354    Subjective  No new complaints. Pt. reports no shoulder pain at this time.      Pertinent History  pt. known well to PT clinic.  See previous PMHx.      Limitations  Lifting;House hold activities;Other (comment)    Patient Stated Goals  Increase L shoulder ROM/ strength to improve pain-free mobility and return to playing golf.      Currently in Pain?  No/denies          There.ex:  B UBE 2 min. f/b(warm-up/ no charge).  Standing Nautilus:60# lat. Pull downs/40#scap. Retraction/40# tricep ext./30# chest press/30# sh. Adduction with handles/20x each.  GTB bicep curls 20x with mirror feedback.  Standing L shoulder flexion/ abd. (10x) with PT assist.   Supine wt. Wand shoulder flexion/ press-ups/tricep ext 10x2. Supine L shoulder rhythmic stabs (all planes)-Mod. Resistance, esp. With sh. Flexion 5x 30 sec.  See new HEP  Manual tx.  Supine L sh. AP/inf. Mobs. Grade II-III 4x 20 sec. In supine position (pain limited/ modified). Supine L  shoulder AA/PROM10x all planes with static holds (pain limited). STM to L UT/ deltoid musculature3 min.  (L pec. Minor tenderness).     Ice to L shoulder in seated position after tx.        PT Education - 02/23/18 1603    Education provided  Yes    Education Details  Golf swing analysis    Person(s) Educated  Patient    Methods  Explanation;Demonstration    Comprehension  Verbalized understanding;Returned demonstration          PT Long Term Goals - 02/09/18 1249      PT LONG TERM GOAL #1   Title  Pt. I with HEP to increase L shoulder PROM to [facility] as compared to R shoulder to improve progression to resisted ex./ strength training.      Baseline  Supine L shoulder AROM: flexion 132 deg., abd. 110 deg., ER 31 deg., IR 74 deg. Seated L shoulder AROM: flexion 88 deg., abd. 82 deg    Time  4    Period  Weeks    Status  Partially Met    Target Date  03/02/18      PT LONG TERM GOAL #2   Title  Pt. will reports no L shoulder pain/ tenderness with palpation to improve progression to isometrics.      Baseline  No tenderness with palpation to L sh. today.      Time  4    Period  Weeks    Status  Achieved    Target Date  11/29/17      PT LONG TERM GOAL #3   Title  Pt. will complete QuickDASH and score <30% to improve functional mobility with UE.      Baseline  QuickDASH: 40 on 01/04/18.      Time  4    Period  Weeks    Status  Not Met    Target Date  03/02/18      PT LONG TERM GOAL #4   Title  Pt. will increase to full L shoulder AROM/ isometrics with no increase c/o pain per MD protocol to promote return to househould/ work-related tasks.     Baseline  Supine L shoulder AROM: flexion 132 deg., abd. 110 deg., ER 31 deg., IR 74 deg. Seated L shoulder AROM: flexion 88 deg., abd. 82 deg    Time  4    Period  Weeks    Status  Partially Met    Target Date  03/02/18      PT LONG TERM GOAL #5   Title  Pt. able to return to playing 18 holes of golf with no increase c/o L  shoulder issues/ radicular symptoms.      Baseline  has not returned to golf at this time    Time  4    Period  Weeks    Status  Not Met    Target Date  03/02/18            Plan - 02/24/18 1401    Clinical Impression Statement  Pt. showing slow but consistent progress with L shoulder strengthening.  Pt. able to hold/ maintain L shoulder flexion at 90 deg. with mod./max. manual resistance.  Minimal tenderness in L anterior sh./ pec. minor with supine horizontal end-range abd.  L shoulder mobs. remain hypomobile all planes.  PT issued rhythmic stabs for HEP (wife assist).  Pain limited with overhead reaching at 2nd shelf.      Clinical Presentation  Stable    Clinical Decision Making  Low    Rehab Potential  Good    PT Frequency  2x / week    PT Duration  4 weeks    PT Treatment/Interventions  Aquatic Therapy;Cryotherapy;Moist Heat;Electrical Stimulation;Ultrasound;Neuromuscular re-education;Balance training;Therapeutic exercise;Manual techniques;Patient/family education;Therapeutic activities    PT Next Visit Plan  Manual therapy to improve ROM/ gross shoulder strengthening with Nautlius.  Return to [place]  See HEP    Consulted and Agree with Plan of Care  Patient;Family member/caregiver       Patient will benefit from skilled therapeutic intervention in order to improve the following deficits and impairments:  Pain, Improper body mechanics, Decreased coordination, Decreased mobility, Decreased scar mobility, Postural dysfunction, Decreased strength, Decreased range of motion, Decreased endurance, Impaired UE functional use, Hypomobility  Visit Diagnosis: S/P left rotator cuff repair  Shoulder joint stiffness, left  Muscle weakness (generalized)  Chronic left shoulder pain     Problem List There are no active problems to display for this patient.  Pura Spice, PT, DPT # [phone] 02/24/2018, 2:10 PM  [place] Sean Hubbard  [facility] [address] [place], [place], [address] Phone: [phone]   Fax:  [phone]  Name: [name] MRN: [id number] Date of Birth: 11-Oct-1941

## 2018-02-24 NOTE — Therapy (Signed)
[facility] [address] [place], [place], [address] Phone: [phone]   Fax:  [phone]  Physical Therapy Treatment  Patient Details  Name: Sean Hubbard MRN: [id number] Date of Birth: 1941-08-25 Referring Provider: Vonna Drafts, MD   Encounter Date: 02/20/2018  PT End of Session - 02/24/18 1538    Visit Number  29    Number of Visits  33    Date for PT Re-Evaluation  03/02/18    PT Start Time  6568    PT Stop Time  1510    PT Time Calculation (min)  49 min    Activity Tolerance  Patient tolerated treatment well;Patient limited by pain    Behavior During Therapy  [facility] for tasks assessed/performed       Past Medical History:  Diagnosis Date  . COPD (chronic obstructive pulmonary disease) ([place])   . Gout   . Hyperlipemia   . Hypertension     No past surgical history on file.  There were no vitals filed for this visit.  Subjective Assessment - 02/24/18 1537    Subjective  Pt. has not gone to golf driving range yet.  Pts. wife reports pt. has limited compliance with HEP.  Pt. states he has been busy planting his garden.      Pertinent History  pt. known well to PT clinic.  See previous PMHx.      Limitations  Lifting;House hold activities;Other (comment)    Patient Stated Goals  Increase L shoulder ROM/ strength to improve pain-free mobility and return to playing golf.      Currently in Pain?  No/denies         No manual tx. Today.  There.ex:  B UBE 2 min. f/b(warm-up/ no charge).  Supine L shoulder D1/D2 diagonals 5x each with PT assist.  Standing L shoulder flexion/ abd. (10x) with PT assist.  Mirror feedback/ cuing to avoid lumbar extension and compensatory movement patterns.   Supine wt. Wand shoulder flexion/ press-ups/tricep ext10x2. Supine L shoulder rhythmic stabs (all planes)-Mod. Resistance, esp. With sh. Flexion 5x 30 sec. Standing Nautilus:60# lat. Pull downs/40#scap. Retraction/40# tricep  ext./30# chest press/30# sh. Adduction with handles/20x each.  Golf swing analysis/ there.ex. Outside with plastic golf balls.      No ice to L shoulder today.       PT Education - 02/23/18 1603    Education provided  Yes    Education Details  Golf swing analysis    Person(s) Educated  Patient    Methods  Explanation;Demonstration    Comprehension  Verbalized understanding;Returned demonstration          PT Long Term Goals - 02/09/18 1249      PT LONG TERM GOAL #1   Title  Pt. I with HEP to increase L shoulder PROM to [facility] as compared to R shoulder to improve progression to resisted ex./ strength training.      Baseline  Supine L shoulder AROM: flexion 132 deg., abd. 110 deg., ER 31 deg., IR 74 deg. Seated L shoulder AROM: flexion 88 deg., abd. 82 deg    Time  4    Period  Weeks    Status  Partially Met    Target Date  03/02/18      PT LONG TERM GOAL #2   Title  Pt. will reports no L shoulder pain/ tenderness with palpation to improve progression to isometrics.      Baseline  No tenderness with palpation  to L sh. today.      Time  4    Period  Weeks    Status  Achieved    Target Date  11/29/17      PT LONG TERM GOAL #3   Title  Pt. will complete QuickDASH and score <30% to improve functional mobility with UE.      Baseline  QuickDASH: 40 on 01/04/18.      Time  4    Period  Weeks    Status  Not Met    Target Date  03/02/18      PT LONG TERM GOAL #4   Title  Pt. will increase to full L shoulder AROM/ isometrics with no increase c/o pain per MD protocol to promote return to househould/ work-related tasks.     Baseline  Supine L shoulder AROM: flexion 132 deg., abd. 110 deg., ER 31 deg., IR 74 deg. Seated L shoulder AROM: flexion 88 deg., abd. 82 deg    Time  4    Period  Weeks    Status  Partially Met    Target Date  03/02/18      PT LONG TERM GOAL #5   Title  Pt. able to return to playing 18 holes of golf with no increase c/o L shoulder issues/ radicular  symptoms.      Baseline  has not returned to golf at this time    Time  4    Period  Weeks    Status  Not Met    Target Date  03/02/18            Plan - 02/24/18 1538    Clinical Impression Statement  Pt. able to demonstrate a more normalized golf swing with use of L shoulder horizontal add./abd.  No increase c/o L shoulder pain during golf swing but pain limited with shoulder flexion >90 deg.  PT reinforced the importance of L shoulder ROM/ strengthening ex. at home to promote gains towards goals.  PT educated pts. wife on rhythmic stabs with focus on shoulder flexion strengthening.      Clinical Presentation  Stable    Clinical Decision Making  Low    Rehab Potential  Good    PT Frequency  2x / week    PT Duration  4 weeks    PT Treatment/Interventions  Aquatic Therapy;Cryotherapy;Moist Heat;Electrical Stimulation;Ultrasound;Neuromuscular re-education;Balance training;Therapeutic exercise;Manual techniques;Patient/family education;Therapeutic activities    PT Next Visit Plan  Manual therapy to improve ROM/ gross shoulder strengthening with Nautlius.  Return to [place]  See HEP    Consulted and Agree with Plan of Care  Patient;Family member/caregiver       Patient will benefit from skilled therapeutic intervention in order to improve the following deficits and impairments:  Pain, Improper body mechanics, Decreased coordination, Decreased mobility, Decreased scar mobility, Postural dysfunction, Decreased strength, Decreased range of motion, Decreased endurance, Impaired UE functional use, Hypomobility  Visit Diagnosis: S/P left rotator cuff repair  Shoulder joint stiffness, left  Muscle weakness (generalized)  Chronic left shoulder pain     Problem List There are no active problems to display for this patient.  Pura Spice, PT, DPT # [phone] 02/24/2018, 3:57 PM  [place] [facility] [address] [place], [place], [address] Phone: [phone]   Fax:  [phone]  Name: Sean Hubbard MRN: [id number] Date of Birth: [birth year]

## 2018-02-25 ENCOUNTER — Encounter: Payer: Self-pay | Admitting: Physical Therapy

## 2018-02-25 NOTE — Therapy (Signed)
[facility] [address] [place], [place], [address] Phone: [phone]   Fax:  [phone]  Physical Therapy Treatment  Patient Details  Name: Sean Hubbard MRN: [id number] Date of Birth: 30-Jul-1941 Referring Provider: Vonna Drafts, MD   Encounter Date: 02/22/2018  PT End of Session - 02/25/18 0743    Visit Number  30    Number of Visits  33    Date for PT Re-Evaluation  03/02/18    PT Start Time  9509    PT Stop Time  1527    PT Time Calculation (min)  59 min    Activity Tolerance  Patient tolerated treatment well;Patient limited by pain    Behavior During Therapy  [facility] for tasks assessed/performed       Past Medical History:  Diagnosis Date  . COPD (chronic obstructive pulmonary disease) ([place])   . Gout   . Hyperlipemia   . Hypertension     History reviewed. No pertinent surgical history.  There were no vitals filed for this visit.  Subjective Assessment - 02/25/18 0741    Subjective  Pt. reports no new complaints.  Pt. has not returned to hitting golf balls due to being busy.  Pt. reports working on garden all day today.  No issues with L shoulder reported during yardwork.      Pertinent History  pt. known well to PT clinic.  See previous PMHx.      Limitations  Lifting;House hold activities;Other (comment)    Patient Stated Goals  Increase L shoulder ROM/ strength to improve pain-free mobility and return to playing golf.      Currently in Pain?  No/denies         There.ex:  B UBE 2 min. f/b(warm-up/ no charge).  Golf swing analysis/ there.ex. Outside with plastic golf balls (13 min.) Standing L shoulder flexion/ abd. 5x with PT assist.Mirror feedback/ cuing to avoid lumbar extension and compensatory movement patterns.   Supine wt. Wand shoulder flexion/ press-ups/ tricep ext10x2. Supine L shoulder rhythmic stabs (all planes)-Mod.Resistance, esp. With sh. Flexion 5x 30 sec. Standing Nautilus:60# lat.  Pull downs/40#scap. Retraction/40# tricep ext./30# sh. Adduction with handles/ 20# bicep curls with wand20x each.  Manual tx.  Supine L shoulder AA/PROM flexion/ abd./ horiz. Abd. With pec. STM 10x each. L shoulder LAD 4x with gentle oscillations as tolerated.   Ice to L shoulder in sitting after tx.             PT Long Term Goals - 02/09/18 1249      PT LONG TERM GOAL #1   Title  Pt. I with HEP to increase L shoulder PROM to [facility] as compared to R shoulder to improve progression to resisted ex./ strength training.      Baseline  Supine L shoulder AROM: flexion 132 deg., abd. 110 deg., ER 31 deg., IR 74 deg. Seated L shoulder AROM: flexion 88 deg., abd. 82 deg    Time  4    Period  Weeks    Status  Partially Met    Target Date  03/02/18      PT LONG TERM GOAL #2   Title  Pt. will reports no L shoulder pain/ tenderness with palpation to improve progression to isometrics.      Baseline  No tenderness with palpation to L sh. today.      Time  4    Period  Weeks    Status  Achieved  Target Date  11/29/17      PT LONG TERM GOAL #3   Title  Pt. will complete QuickDASH and score <30% to improve functional mobility with UE.      Baseline  QuickDASH: 40 on 01/04/18.      Time  4    Period  Weeks    Status  Not Met    Target Date  03/02/18      PT LONG TERM GOAL #4   Title  Pt. will increase to full L shoulder AROM/ isometrics with no increase c/o pain per MD protocol to promote return to househould/ work-related tasks.     Baseline  Supine L shoulder AROM: flexion 132 deg., abd. 110 deg., ER 31 deg., IR 74 deg. Seated L shoulder AROM: flexion 88 deg., abd. 82 deg    Time  4    Period  Weeks    Status  Partially Met    Target Date  03/02/18      PT LONG TERM GOAL #5   Title  Pt. able to return to playing 18 holes of golf with no increase c/o L shoulder issues/ radicular symptoms.      Baseline  has not returned to golf at this time    Time  4    Period  Weeks     Status  Not Met    Target Date  03/02/18            Plan - 02/25/18 0744    Clinical Impression Statement  Prior to PT tx. session, pt. demonstrates >100 deg. L shoulder flexion AROM.  Pt. showing slow but consistent L shoulder improvement with overhead/ strengthening tasks.  Pt. has moderate crepitus in L shoulder with movement/manual stretches.  Cervical spine/ B UT region remains hypomobile.  Pt. able to take full golf swing with fairway wood/ iron outside with no c/o L shoulder pain.  Pts. grasp limited by finger inflammation/ arthritis.  Pt. regrets not having finger surgery last month because he wanted to be able to participate with aquatic ex. (2-3x/week).        Clinical Presentation  Stable    Clinical Decision Making  Low    Rehab Potential  Good    PT Frequency  2x / week    PT Duration  4 weeks    PT Treatment/Interventions  Aquatic Therapy;Cryotherapy;Moist Heat;Electrical Stimulation;Ultrasound;Neuromuscular re-education;Balance training;Therapeutic exercise;Manual techniques;Patient/family education;Therapeutic activities    PT Next Visit Plan  Manual therapy to improve ROM/ gross shoulder strengthening with Nautlius.  Return to [place]  See HEP       Patient will benefit from skilled therapeutic intervention in order to improve the following deficits and impairments:  Pain, Improper body mechanics, Decreased coordination, Decreased mobility, Decreased scar mobility, Postural dysfunction, Decreased strength, Decreased range of motion, Decreased endurance, Impaired UE functional use, Hypomobility  Visit Diagnosis: S/P left rotator cuff repair  Shoulder joint stiffness, left  Muscle weakness (generalized)  Chronic left shoulder pain     Problem List There are no active problems to display for this patient.  Pura Spice, PT, DPT # [phone] 02/25/2018, 7:51 AM  [facility] [address] [place], [place], [address] Phone: [phone]   Fax:  [phone]  Name: Sean Hubbard MRN: [id number] Date of Birth: 05/15/1941

## 2018-02-27 ENCOUNTER — Ambulatory Visit: Payer: Medicare Other | Admitting: Physical Therapy

## 2018-02-27 ENCOUNTER — Encounter: Payer: Self-pay | Admitting: Physical Therapy

## 2018-02-27 DIAGNOSIS — Z9889 Other specified postprocedural states: Secondary | ICD-10-CM | POA: Diagnosis not present

## 2018-02-27 DIAGNOSIS — M6281 Muscle weakness (generalized): Secondary | ICD-10-CM

## 2018-02-27 DIAGNOSIS — M25612 Stiffness of left shoulder, not elsewhere classified: Secondary | ICD-10-CM

## 2018-02-27 DIAGNOSIS — M2560 Stiffness of unspecified joint, not elsewhere classified: Secondary | ICD-10-CM

## 2018-02-27 DIAGNOSIS — G8929 Other chronic pain: Secondary | ICD-10-CM

## 2018-02-27 DIAGNOSIS — M25512 Pain in left shoulder: Secondary | ICD-10-CM

## 2018-02-27 NOTE — Therapy (Signed)
[facility] [address] [place], [place], [address] Phone: [phone]   Fax:  [phone]  Physical Therapy Treatment  Patient Details  Name: TEANDRE HAMRE MRN: [id number] Date of Birth: June 25, 1941 Referring Provider: Vonna Drafts, MD   Encounter Date: 02/27/2018  PT End of Session - 02/27/18 1431    Visit Number  31    Number of Visits  33    Date for PT Re-Evaluation  03/02/18    PT Start Time  1430    PT Stop Time  1513    PT Time Calculation (min)  43 min    Activity Tolerance  Patient tolerated treatment well;Patient limited by pain    Behavior During Therapy  [facility] for tasks assessed/performed       Past Medical History:  Diagnosis Date  . COPD (chronic obstructive pulmonary disease) ([place])   . Gout   . Hyperlipemia   . Hypertension     History reviewed. No pertinent surgical history.  There were no vitals filed for this visit.  Subjective Assessment - 02/27/18 1433    Subjective  Pt has not attempted to play golf yet but is planning to try to hit some ball on Thursday.  Pt did not complete his HEP this week because he has been working hard in the garden and yard and is having some soreness in his shoulder due to this.  He did not have any pain while doing his gardening and yard work.  Pt is still unable to remain sleeping on his side due to soreness.      Pertinent History  pt. known well to PT clinic.  See previous PMHx.      Limitations  Lifting;House hold activities;Other (comment)    Patient Stated Goals  Increase L shoulder ROM/ strength to improve pain-free mobility and return to playing golf.      Currently in Pain?  Yes    Pain Score  1     Pain Location  Shoulder    Pain Orientation  Left    Pain Descriptors / Indicators  Sore    Pain Type  Chronic pain    Pain Onset  More than a month ago        TREATMENT   B UBE 2 min. f/b?(warm-up/ no charge). ?   Standing L shoulder flexion/ abd 5x with PT  assist. Mirror feedback/ cuing to avoid lumbar extension and compensatory movement patterns.?   Standing L shoulder flexion slides on wall with inferior glide from PT with towel 2x10?   Supine weighted wand shoulder flexion/ press-ups/ tricep ext with 3# weight 10x2. ?  Supine L shoulder AA/PROM flexion/ abd with pec. STM 10x each. ?   Standing Nautilus: 60# lat. Pull downs/ 40# scap. Retraction/ 40# tricep ext./ 20# L shoulder flexion with handle/ 20# bicep curls with handle 2x10 each.                         PT Education - 02/27/18 1431    Education provided  Yes    Education Details  Exercise technique    Person(s) Educated  Patient    Methods  Explanation;Demonstration;Verbal cues    Comprehension  Verbalized understanding;Returned demonstration;Need further instruction          PT Long Term Goals - 02/09/18 1249      PT LONG TERM GOAL #1   Title  Pt. I with HEP to increase L  shoulder PROM to [facility] as compared to R shoulder to improve progression to resisted ex./ strength training.      Baseline  Supine L shoulder AROM: flexion 132 deg., abd. 110 deg., ER 31 deg., IR 74 deg. Seated L shoulder AROM: flexion 88 deg., abd. 82 deg    Time  4    Period  Weeks    Status  Partially Met    Target Date  03/02/18      PT LONG TERM GOAL #2   Title  Pt. will reports no L shoulder pain/ tenderness with palpation to improve progression to isometrics.      Baseline  No tenderness with palpation to L sh. today.      Time  4    Period  Weeks    Status  Achieved    Target Date  11/29/17      PT LONG TERM GOAL #3   Title  Pt. will complete QuickDASH and score <30% to improve functional mobility with UE.      Baseline  QuickDASH: 40 on 01/04/18.      Time  4    Period  Weeks    Status  Not Met    Target Date  03/02/18      PT LONG TERM GOAL #4   Title  Pt. will increase to full L shoulder AROM/ isometrics with no increase c/o pain per MD protocol to promote return  to househould/ work-related tasks.     Baseline  Supine L shoulder AROM: flexion 132 deg., abd. 110 deg., ER 31 deg., IR 74 deg. Seated L shoulder AROM: flexion 88 deg., abd. 82 deg    Time  4    Period  Weeks    Status  Partially Met    Target Date  03/02/18      PT LONG TERM GOAL #5   Title  Pt. able to return to playing 18 holes of golf with no increase c/o L shoulder issues/ radicular symptoms.      Baseline  has not returned to golf at this time    Time  4    Period  Weeks    Status  Not Met    Target Date  03/02/18            Plan - 02/27/18 1436    Clinical Impression Statement  Progressed supine wand exercises to include 3# weight for improved strengthening and stretching.  Trialed inferior mobilization with movement with L shoulder moving into flexion this session with improved mechanics following.  Pt will benefit from continued skilled PT interventions for improved strength, ROM, and functional use of LUE.      Rehab Potential  Good    PT Frequency  2x / week    PT Duration  4 weeks    PT Treatment/Interventions  Aquatic Therapy;Cryotherapy;Moist Heat;Electrical Stimulation;Ultrasound;Neuromuscular re-education;Balance training;Therapeutic exercise;Manual techniques;Patient/family education;Therapeutic activities    PT Next Visit Plan  Manual therapy to improve ROM/ gross shoulder strengthening with Nautlius.  Return to [place]  See HEP       Patient will benefit from skilled therapeutic intervention in order to improve the following deficits and impairments:  Pain, Improper body mechanics, Decreased coordination, Decreased mobility, Decreased scar mobility, Postural dysfunction, Decreased strength, Decreased range of motion, Decreased endurance, Impaired UE functional use, Hypomobility  Visit Diagnosis: S/P left rotator cuff repair  Shoulder joint stiffness, left  Muscle weakness (generalized)  Chronic left shoulder pain  Joint  stiffness     Problem List There are no active problems to display for this patient.  Collie Siad PT, DPT 02/27/2018, 3:12 PM  [place] [facility] [address] [place], [place], [address] Phone: [phone]   Fax:  [phone]  Name: ALPHONSO GREGSON MRN: [id number] Date of Birth: [birth year]

## 2018-02-28 ENCOUNTER — Encounter: Payer: Self-pay | Admitting: Physical Therapy

## 2018-02-28 ENCOUNTER — Ambulatory Visit: Payer: Medicare Other | Admitting: Physical Therapy

## 2018-02-28 DIAGNOSIS — G8929 Other chronic pain: Secondary | ICD-10-CM

## 2018-02-28 DIAGNOSIS — Z9889 Other specified postprocedural states: Secondary | ICD-10-CM | POA: Diagnosis not present

## 2018-02-28 DIAGNOSIS — M2560 Stiffness of unspecified joint, not elsewhere classified: Secondary | ICD-10-CM

## 2018-02-28 DIAGNOSIS — R293 Abnormal posture: Secondary | ICD-10-CM

## 2018-02-28 DIAGNOSIS — M5442 Lumbago with sciatica, left side: Secondary | ICD-10-CM

## 2018-02-28 DIAGNOSIS — M25612 Stiffness of left shoulder, not elsewhere classified: Secondary | ICD-10-CM

## 2018-02-28 DIAGNOSIS — M25512 Pain in left shoulder: Secondary | ICD-10-CM

## 2018-02-28 DIAGNOSIS — M6281 Muscle weakness (generalized): Secondary | ICD-10-CM

## 2018-02-28 NOTE — Therapy (Signed)
[place] [facility] [address] [place], [place], [address] Phone: [phone]   Fax:  [phone]  Physical Therapy Treatment  Patient Details  Name: Sean Hubbard MRN: [id number] Date of Birth: 05-27-1941 Referring Provider: Vonna Drafts, MD   Encounter Date: 02/28/2018  PT End of Session - 02/28/18 1255    Visit Number  32    Number of Visits  33    Date for PT Re-Evaluation  03/02/18    PT Start Time  2836    PT Stop Time  1341    PT Time Calculation (min)  46 min    Activity Tolerance  Patient tolerated treatment well;Patient limited by pain    Behavior During Therapy  [facility] for tasks assessed/performed       Past Medical History:  Diagnosis Date  . COPD (chronic obstructive pulmonary disease) ([employer])   . Gout   . Hyperlipemia   . Hypertension     History reviewed. No pertinent surgical history.  There were no vitals filed for this visit.  Subjective Assessment - 02/28/18 1255    Subjective  Pt reports his shoulder is sore from his PT session yesterday but is doing well.  He says he will try to go golfing today for the first time.      Pertinent History  pt. known well to PT clinic.  See previous PMHx.      Limitations  Lifting;House hold activities;Other (comment)    Patient Stated Goals  Increase L shoulder ROM/ strength to improve pain-free mobility and return to playing golf.      Currently in Pain?  Yes    Pain Score  5     Pain Location  Shoulder    Pain Orientation  Left    Pain Descriptors / Indicators  Sore    Pain Onset  More than a month ago          TREATMENT   B UBE 2 min. f/b (warm-up/ no charge)   Supine weighted wand shoulder flexion/ press-ups/ tricep ext with 3# weight x20.   Grade III-IV L shoulder AP mobs 4x30 seconds?   Grade III-IV L shoulder inferior mobs 4x30 seconds?   Supine L shoulder AA/PROM flexion/ abd with pec. STM 10x each.   Seated prayer stretch with theraball with 10 second  holds x10 forward and x10 to the R   Standing L shoulder flexion slides on wall with inferior glide from PT with towel x10?   Standing L shoulder abduction slides on wall with inferior glide from PT with towel x10?   Standing Nautilus: 60# lat. Pull downs/ 40# scap. Retraction/ 10# L shoulder flexion with handle 2x10 each.           PT Education - 02/28/18 1255    Education provided  Yes    Education Details  Exercise technique    Person(s) Educated  Patient    Methods  Explanation;Demonstration;Verbal cues    Comprehension  Verbalized understanding;Returned demonstration;Verbal cues required;Need further instruction          PT Long Term Goals - 02/09/18 1249      PT LONG TERM GOAL #1   Title  Pt. I with HEP to increase L shoulder PROM to [facility] as compared to R shoulder to improve progression to resisted ex./ strength training.      Baseline  Supine L shoulder AROM: flexion 132 deg., abd. 110 deg., ER 31 deg., IR 74 deg. Seated L shoulder  AROM: flexion 88 deg., abd. 82 deg    Time  4    Period  Weeks    Status  Partially Met    Target Date  03/02/18      PT LONG TERM GOAL #2   Title  Pt. will reports no L shoulder pain/ tenderness with palpation to improve progression to isometrics.      Baseline  No tenderness with palpation to L sh. today.      Time  4    Period  Weeks    Status  Achieved    Target Date  11/29/17      PT LONG TERM GOAL #3   Title  Pt. will complete QuickDASH and score <30% to improve functional mobility with UE.      Baseline  QuickDASH: 40 on 01/04/18.      Time  4    Period  Weeks    Status  Not Met    Target Date  03/02/18      PT LONG TERM GOAL #4   Title  Pt. will increase to full L shoulder AROM/ isometrics with no increase c/o pain per MD protocol to promote return to househould/ work-related tasks.     Baseline  Supine L shoulder AROM: flexion 132 deg., abd. 110 deg., ER 31 deg., IR 74 deg. Seated L shoulder AROM: flexion 88 deg., abd.  82 deg    Time  4    Period  Weeks    Status  Partially Met    Target Date  03/02/18      PT LONG TERM GOAL #5   Title  Pt. able to return to playing 18 holes of golf with no increase c/o L shoulder issues/ radicular symptoms.      Baseline  has not returned to golf at this time    Time  4    Period  Weeks    Status  Not Met    Target Date  03/02/18            Plan - 02/28/18 1257    Clinical Impression Statement  Pt demonstrates some pain with wand exercises and requires cues to ease into the range rather than pushing as far as he can.  This PT performed joint mobilizations to L shoulder in the inferior and AP directions for improved mobility, followed by P/AAROM to L shoulder and a prayer stretch on the theraball to stretch L shoulder musculature.  Pt will benefit from continued skilled PT interventions for improved L shoulder functional use.      Rehab Potential  Good    PT Frequency  2x / week    PT Duration  4 weeks    PT Treatment/Interventions  Aquatic Therapy;Cryotherapy;Moist Heat;Electrical Stimulation;Ultrasound;Neuromuscular re-education;Balance training;Therapeutic exercise;Manual techniques;Patient/family education;Therapeutic activities    PT Next Visit Plan  Manual therapy to improve ROM/ gross shoulder strengthening with Nautlius.  Return to [place]  See HEP       Patient will benefit from skilled therapeutic intervention in order to improve the following deficits and impairments:  Pain, Improper body mechanics, Decreased coordination, Decreased mobility, Decreased scar mobility, Postural dysfunction, Decreased strength, Decreased range of motion, Decreased endurance, Impaired UE functional use, Hypomobility  Visit Diagnosis: S/P left rotator cuff repair  Shoulder joint stiffness, left  Muscle weakness (generalized)  Chronic left shoulder pain  Joint stiffness  Acute bilateral low back pain with left-sided sciatica  Abnormal  posture  Problem List There are no active problems to display for this patient.   Collie Siad PT, DPT 02/28/2018, 1:42 PM  [place] [facility] [address] [place], [place], [address] Phone: [phone]   Fax:  [phone]  Name: GARY GABRIELSEN MRN: [id number] Date of Birth: 10/26/1941

## 2018-03-01 ENCOUNTER — Encounter: Payer: Medicare Other | Admitting: Physical Therapy

## 2018-03-06 ENCOUNTER — Ambulatory Visit: Payer: Medicare Other | Admitting: Physical Therapy

## 2018-03-06 ENCOUNTER — Encounter: Payer: Self-pay | Admitting: Physical Therapy

## 2018-03-06 DIAGNOSIS — M6281 Muscle weakness (generalized): Secondary | ICD-10-CM

## 2018-03-06 DIAGNOSIS — M25612 Stiffness of left shoulder, not elsewhere classified: Secondary | ICD-10-CM

## 2018-03-06 DIAGNOSIS — M25512 Pain in left shoulder: Secondary | ICD-10-CM

## 2018-03-06 DIAGNOSIS — G8929 Other chronic pain: Secondary | ICD-10-CM

## 2018-03-06 DIAGNOSIS — Z9889 Other specified postprocedural states: Secondary | ICD-10-CM | POA: Diagnosis not present

## 2018-03-06 DIAGNOSIS — M2560 Stiffness of unspecified joint, not elsewhere classified: Secondary | ICD-10-CM

## 2018-03-06 NOTE — Therapy (Signed)
[facility] [address] [place], [place], [address] Phone: [phone]   Fax:  [phone]  Physical Therapy Treatment  Patient Details  Name: Sean Hubbard MRN: [id number] Date of Birth: 1941/02/17 Referring Provider: Vonna Drafts, MD   Encounter Date: 03/06/2018  PT End of Session - 03/06/18 1513    Visit Number  33    Number of Visits  41    Date for PT Re-Evaluation  04/03/18    PT Start Time  7893    PT Stop Time  1558    PT Time Calculation (min)  44 min    Activity Tolerance  Patient tolerated treatment well;Patient limited by pain    Behavior During Therapy  [facility] for tasks assessed/performed       Past Medical History:  Diagnosis Date  . COPD (chronic obstructive pulmonary disease) ([place])   . Gout   . Hyperlipemia   . Hypertension     History reviewed. No pertinent surgical history.  There were no vitals filed for this visit.  Subjective Assessment - 03/06/18 1518    Subjective  Pt reports he has played golf 3x since last session with no pain in the shoulder and no issues.  He says he has been able to hit the ball as far as he used to be able to.  Saw his MD yesterday who says that the pt is ahead of schedule.    Pertinent History  pt. known well to PT clinic.  See previous PMHx.      Limitations  Lifting;House hold activities;Other (comment)    Patient Stated Goals  Increase L shoulder ROM/ strength to improve pain-free mobility and return to playing golf.      Currently in Pain?  No/denies    Pain Onset  More than a month ago        TREATMENT   QuickDash: 11.36%   Supine weighted wand shoulder flexion/ press-ups/ tricep ext with 3# weight x20.   Grade III-IV L shoulder AP mobs 4x30 seconds?   Grade III-IV L shoulder inferior mobs 4x30 seconds?   Standing Nautilus: 60# lat. Pull downs/ 40# scap. Retraction/ 10# L shoulder flexion with handle 2x10 each.   Standing L shoulder flexion slides on wall  with inferior glide from PT with towel x10 ?  Standing L shoulder abduction slides on wall with inferior glide from PT with towel x10                         PT Education - 03/06/18 1513    Education provided  Yes    Education Details  Exercise technique; POC moving forward     Person(s) Educated  Patient    Methods  Explanation;Demonstration;Verbal cues    Comprehension  Verbalized understanding;Verbal cues required;Returned demonstration;Need further instruction          PT Long Term Goals - 03/06/18 1521      PT LONG TERM GOAL #1   Title  Pt. I with HEP to increase L shoulder PROM to [facility] as compared to R shoulder to improve progression to resisted ex./ strength training.      Baseline  Supine L shoulder AROM: flexion 132 deg., abd. 110 deg., ER 31 deg., IR 74 deg. Seated L shoulder AROM: flexion 88 deg., abd. 82 deg; 4/22: flexion 134 deg., abd. 102 deg., ER 26 deg    Time  4    Period  Weeks  Status  Partially Met      PT LONG TERM GOAL #2   Title  Pt. will reports no L shoulder pain/ tenderness with palpation to improve progression to isometrics.      Baseline  No tenderness with palpation to L sh. today.      Time  4    Period  Weeks    Status  Achieved      PT LONG TERM GOAL #3   Title  Pt. will complete QuickDASH and score <30% to improve functional mobility with UE.      Baseline  QuickDASH: 40 on 01/04/18; 4/22: 11.36%    Time  4    Period  Weeks    Status  Achieved      PT LONG TERM GOAL #4   Title  Pt. will increase to full L shoulder AROM/ isometrics with no increase c/o pain per MD protocol to promote return to househould/ work-related tasks.     Baseline  Supine L shoulder AROM: flexion 132 deg., abd. 110 deg., ER 31 deg., IR 74 deg. Seated L shoulder AROM: flexion 88 deg., abd. 82 deg; 4/22: pt denies pain with strengthening exercises (see above for measurements)    Time  4    Period  Weeks    Status  Partially Met      PT LONG TERM  GOAL #5   Title  Pt. able to return to playing 18 holes of golf with no increase c/o L shoulder issues/ radicular symptoms.      Baseline  has not returned to golf at this time; 4/22: pt has returned to playing 9 holes without pain    Time  4    Period  Weeks    Status  Partially Met            Plan - 03/06/18 1520    Clinical Impression Statement  Pt was able to return to playing 9 holes of golf on several occasions without pain.  His goal is to return to playing 18 holes without pain and he is going to try playing 18 holes for the first time tomorrow.  Pt continues to lack full AROM in L shoulder with efforts focused on mobilizations and AROM exercises this session as well as strengthening.  Pt's QuickDash significantly improved since initial evaluation.  Pt will benefit from continued skilled PT interventions for improved ROM, strength, and functional use of LUE.      Rehab Potential  Good    PT Frequency  2x / week    PT Duration  4 weeks    PT Treatment/Interventions  Aquatic Therapy;Cryotherapy;Moist Heat;Electrical Stimulation;Ultrasound;Neuromuscular re-education;Balance training;Therapeutic exercise;Manual techniques;Patient/family education;Therapeutic activities    PT Next Visit Plan  Manual therapy to improve ROM/ gross shoulder strengthening with Nautlius.  Return to [place]  See HEP       Patient will benefit from skilled therapeutic intervention in order to improve the following deficits and impairments:  Pain, Improper body mechanics, Decreased coordination, Decreased mobility, Decreased scar mobility, Postural dysfunction, Decreased strength, Decreased range of motion, Decreased endurance, Impaired UE functional use, Hypomobility  Visit Diagnosis: S/P left rotator cuff repair  Shoulder joint stiffness, left  Muscle weakness (generalized)  Chronic left shoulder pain  Joint stiffness     Problem List There are no active problems to  display for this patient.   Collie Siad PT, DPT 03/06/2018, 3:48 PM  [place]  [facility] [address] [place], [place], [address] Phone: [phone]   Fax:  [phone]  Name: Sean Hubbard MRN: [id number] Date of Birth: 09/19/1941

## 2018-03-10 ENCOUNTER — Ambulatory Visit: Payer: Medicare Other | Admitting: Physical Therapy

## 2018-03-10 DIAGNOSIS — M25512 Pain in left shoulder: Secondary | ICD-10-CM

## 2018-03-10 DIAGNOSIS — G8929 Other chronic pain: Secondary | ICD-10-CM

## 2018-03-10 DIAGNOSIS — Z9889 Other specified postprocedural states: Secondary | ICD-10-CM

## 2018-03-10 DIAGNOSIS — M6281 Muscle weakness (generalized): Secondary | ICD-10-CM

## 2018-03-10 DIAGNOSIS — M25612 Stiffness of left shoulder, not elsewhere classified: Secondary | ICD-10-CM

## 2018-03-10 NOTE — Therapy (Addendum)
[facility] [address] [place], [place], [address] Phone: [phone]   Fax:  [phone]  Physical Therapy Treatment  Patient Details  Name: Sean Hubbard MRN: [id number] Date of Birth: 01/01/1941 Referring Provider: Vonna Drafts, MD   Encounter Date: 03/10/2018  PT End of Session - 03/19/18 1618    Visit Number  34    Number of Visits  41    Date for PT Re-Evaluation  04/03/18    PT Start Time  1510    PT Stop Time  1609    PT Time Calculation (min)  59 min    Activity Tolerance  Patient tolerated treatment well;Patient limited by pain    Behavior During Therapy  [facility] for tasks assessed/performed       Past Medical History:  Diagnosis Date  . COPD (chronic obstructive pulmonary disease) ([place])   . Gout   . Hyperlipemia   . Hypertension     No past surgical history on file.  There were no vitals filed for this visit.  Subjective Assessment - 03/19/18 1617    Subjective  Pt. has been playing golf with no c/o shoulder or finger issues.  Pt. is hoping to play in golf event schedule for mid-May in [place].     Pertinent History  pt. known well to PT clinic.  See previous PMHx.      Limitations  Lifting;House hold activities;Other (comment)    Patient Stated Goals  Increase L shoulder ROM/ strength to improve pain-free mobility and return to playing golf.      Currently in Pain?  No/denies        Pt. Returns to MD in 8 weeks (pt. Reports MD is happy with pts. Progress).          TREATMENT   B UBE 2 min. f/b (warm-up/ no charge)   Supine weighted wand shoulder flexion/ press-ups/ tricep ext with 3# weight x20.   Grade III-IV L shoulder AP mobs 4x30 seconds?   Grade III-IV L shoulder inferior mobs 4x30 seconds?   Supine L shoulder AA/PROM flexion/ abd with pec. STM 10x each.  ?   Standing Nautilus: 60# lat. Pull downs/ 40# scap. Retraction/ 10# L shoulder flexion with handle 2x10 each.   Supine L  shoulder rhythmic stabs/ isometrics all planes.       PT Long Term Goals - 03/06/18 1521      PT LONG TERM GOAL #1   Title  Pt. I with HEP to increase L shoulder PROM to [facility] as compared to R shoulder to improve progression to resisted ex./ strength training.      Baseline  Supine L shoulder AROM: flexion 132 deg., abd. 110 deg., ER 31 deg., IR 74 deg. Seated L shoulder AROM: flexion 88 deg., abd. 82 deg; 4/22: flexion 134 deg., abd. 102 deg., ER 26 deg    Time  4    Period  Weeks    Status  Partially Met      PT LONG TERM GOAL #2   Title  Pt. will reports no L shoulder pain/ tenderness with palpation to improve progression to isometrics.      Baseline  No tenderness with palpation to L sh. today.      Time  4    Period  Weeks    Status  Achieved      PT LONG TERM GOAL #3   Title  Pt. will complete QuickDASH and score <30% to improve  functional mobility with UE.      Baseline  QuickDASH: 40 on 01/04/18; 4/22: 11.36%    Time  4    Period  Weeks    Status  Achieved      PT LONG TERM GOAL #4   Title  Pt. will increase to full L shoulder AROM/ isometrics with no increase c/o pain per MD protocol to promote return to househould/ work-related tasks.     Baseline  Supine L shoulder AROM: flexion 132 deg., abd. 110 deg., ER 31 deg., IR 74 deg. Seated L shoulder AROM: flexion 88 deg., abd. 82 deg; 4/22: pt denies pain with strengthening exercises (see above for measurements)    Time  4    Period  Weeks    Status  Partially Met      PT LONG TERM GOAL #5   Title  Pt. able to return to playing 18 holes of golf with no increase c/o L shoulder issues/ radicular symptoms.      Baseline  has not returned to golf at this time; 4/22: pt has returned to playing 9 holes without pain    Time  4    Period  Weeks    Status  Partially Met            Plan - 03/19/18 1619    Clinical Impression Statement  Pt. making consistent progress towards functional goals of golfing/ strengthening.  L  shoulder flexion remains limited and muscle weakness present.  Pt. has not been too compliant with strengthening HEP but is doing more daily/functional tasks at home/ garden.  Max PT assist with L shoulder flexion >100 deg. and difficulty maintaining static holds overhead.      Clinical Presentation  Stable    Clinical Decision Making  Low    Rehab Potential  Good    PT Frequency  2x / week    PT Duration  4 weeks    PT Treatment/Interventions  Aquatic Therapy;Cryotherapy;Moist Heat;Electrical Stimulation;Ultrasound;Neuromuscular re-education;Balance training;Therapeutic exercise;Manual techniques;Patient/family education;Therapeutic activities    PT Next Visit Plan  Manual therapy to improve ROM/ gross shoulder strengthening with Nautlius.  Return to [place]  See HEP    Consulted and Agree with Plan of Care  Patient;Family member/caregiver       Patient will benefit from skilled therapeutic intervention in order to improve the following deficits and impairments:  Pain, Improper body mechanics, Decreased coordination, Decreased mobility, Decreased scar mobility, Postural dysfunction, Decreased strength, Decreased range of motion, Decreased endurance, Impaired UE functional use, Hypomobility  Visit Diagnosis: S/P left rotator cuff repair  Shoulder joint stiffness, left  Muscle weakness (generalized)  Chronic left shoulder pain     Problem List There are no active problems to display for this patient.  Pura Spice, PT, DPT # [phone] 03/19/2018, 4:23 PM  [facility] [address] [place], [place], [address] Phone: [phone]   Fax:  [phone]  Name: Sean Hubbard MRN: [id number] Date of Birth: 18-Jul-1941

## 2018-03-13 ENCOUNTER — Encounter: Payer: Self-pay | Admitting: Physical Therapy

## 2018-03-13 ENCOUNTER — Ambulatory Visit: Payer: Medicare Other | Admitting: Physical Therapy

## 2018-03-13 DIAGNOSIS — M25612 Stiffness of left shoulder, not elsewhere classified: Secondary | ICD-10-CM

## 2018-03-13 DIAGNOSIS — G8929 Other chronic pain: Secondary | ICD-10-CM

## 2018-03-13 DIAGNOSIS — Z9889 Other specified postprocedural states: Secondary | ICD-10-CM | POA: Diagnosis not present

## 2018-03-13 DIAGNOSIS — M6281 Muscle weakness (generalized): Secondary | ICD-10-CM

## 2018-03-13 DIAGNOSIS — M25512 Pain in left shoulder: Secondary | ICD-10-CM

## 2018-03-14 NOTE — Therapy (Addendum)
[facility] [address] [place], [place], [address] Phone: [phone]   Fax:  [phone]  Physical Therapy Treatment  Patient Details  Name: TRACKER MANCE MRN: [id number] Date of Birth: [birth year] Referring Provider: Vonna Drafts, MD   Encounter Date: 03/13/2018  Treatment 35 of 41.  Recert date: 0/10/27.    Past Medical History:  Diagnosis Date  . COPD (chronic obstructive pulmonary disease) ([place])   . Gout   . Hyperlipemia   . Hypertension     History reviewed. No pertinent surgical history.  There were no vitals filed for this visit.   No new complaints. Pt. has been playing golf and staying active at home. Pt. planning to participate in golf outing in [place].     There.ex.:  B UBE 3 min. f/b (warm-up/ no charge)   Supine weighted wand shoulder flexion/ press-ups/ tricep ext with 3# weight x20.   Standing Nautilus: 60# lat. Pull downs/ 40# scap. Retraction/ 10# L shoulder flexion with handle 2x10 each.  Supine L shoulder rhythmic stabs/ isometrics all planes.     Manual tx.:  Grade III-IV L shoulder AP mobs 4x30 seconds?  Grade III-IV L shoulder inferior mobs 4x30 seconds?  Supine L shoulder AA/PROM flexion/ abd with pec. STM 10x each.  ?             L shoulder AROM making slow but consistent progress and pt. working hard during strengthening ex.  Pt. has occaional episodes of sharp pain with overhead reaching/ resisted chest press at Nautilus.  No tenderness with palpation to L shoulder musculature.  Pt. has marked hypomobility in cervical/thoracic spine.  Good technqiue with golf swing with no c/o pain.       PT Long Term Goals - 03/06/18 1521      PT LONG TERM GOAL #1   Title  Pt. I with HEP to increase L shoulder PROM to [facility] as compared to R shoulder to improve progression to resisted ex./ strength training.      Baseline  Supine L shoulder AROM: flexion 132 deg., abd. 110  deg., ER 31 deg., IR 74 deg. Seated L shoulder AROM: flexion 88 deg., abd. 82 deg; 4/22: flexion 134 deg., abd. 102 deg., ER 26 deg    Time  4    Period  Weeks    Status  Partially Met      PT LONG TERM GOAL #2   Title  Pt. will reports no L shoulder pain/ tenderness with palpation to improve progression to isometrics.      Baseline  No tenderness with palpation to L sh. today.      Time  4    Period  Weeks    Status  Achieved      PT LONG TERM GOAL #3   Title  Pt. will complete QuickDASH and score <30% to improve functional mobility with UE.      Baseline  QuickDASH: 40 on 01/04/18; 4/22: 11.36%    Time  4    Period  Weeks    Status  Achieved      PT LONG TERM GOAL #4   Title  Pt. will increase to full L shoulder AROM/ isometrics with no increase c/o pain per MD protocol to promote return to househould/ work-related tasks.     Baseline  Supine L shoulder AROM: flexion 132 deg., abd. 110 deg., ER 31 deg., IR 74 deg. Seated L shoulder AROM: flexion 88 deg., abd. 82  deg; 4/22: pt denies pain with strengthening exercises (see above for measurements)    Time  4    Period  Weeks    Status  Partially Met      PT LONG TERM GOAL #5   Title  Pt. able to return to playing 18 holes of golf with no increase c/o L shoulder issues/ radicular symptoms.      Baseline  has not returned to golf at this time; 4/22: pt has returned to playing 9 holes without pain    Time  4    Period  Weeks    Status  Partially Met          Patient will benefit from skilled therapeutic intervention in order to improve the following deficits and impairments:  Pain, Improper body mechanics, Decreased coordination, Decreased mobility, Decreased scar mobility, Postural dysfunction, Decreased strength, Decreased range of motion, Decreased endurance, Impaired UE functional use, Hypomobility  Visit Diagnosis: S/P left rotator cuff repair  Shoulder joint stiffness, left  Muscle weakness (generalized)  Chronic left  shoulder pain     Problem List There are no active problems to display for this patient.  Pura Spice, PT, DPT # [phone] 03/21/2018, 7:06 PM  [facility] [address] [place], [place], [address] Phone: [phone]   Fax:  [phone]  Name: RAKAN SOFFER MRN: [id number] Date of Birth: 19-Oct-1941

## 2018-03-15 ENCOUNTER — Ambulatory Visit: Payer: Medicare Other | Attending: Orthopedic Surgery | Admitting: Physical Therapy

## 2018-03-15 DIAGNOSIS — Z9889 Other specified postprocedural states: Secondary | ICD-10-CM | POA: Insufficient documentation

## 2018-03-15 DIAGNOSIS — M25612 Stiffness of left shoulder, not elsewhere classified: Secondary | ICD-10-CM | POA: Diagnosis present

## 2018-03-15 DIAGNOSIS — M2560 Stiffness of unspecified joint, not elsewhere classified: Secondary | ICD-10-CM | POA: Insufficient documentation

## 2018-03-15 DIAGNOSIS — M25512 Pain in left shoulder: Secondary | ICD-10-CM | POA: Diagnosis present

## 2018-03-15 DIAGNOSIS — G8929 Other chronic pain: Secondary | ICD-10-CM | POA: Diagnosis present

## 2018-03-15 DIAGNOSIS — M6281 Muscle weakness (generalized): Secondary | ICD-10-CM | POA: Diagnosis present

## 2018-03-21 ENCOUNTER — Encounter: Payer: Self-pay | Admitting: Physical Therapy

## 2018-03-21 NOTE — Therapy (Signed)
[facility] [address] [place], [place], [address] Phone: [phone]   Fax:  [phone]  Physical Therapy Treatment  Patient Details  Name: Sean Hubbard MRN: [id number] Date of Birth: 07/29/1941 Referring Provider: Vonna Drafts, MD   Encounter Date: 03/15/2018  PT End of Session - 03/21/18 1827    Visit Number  35    Number of Visits  41    Date for PT Re-Evaluation  04/03/18    PT Start Time  5885    PT Stop Time  1525    PT Time Calculation (min)  53 min    Activity Tolerance  Patient tolerated treatment well    Behavior During Therapy  [facility] for tasks assessed/performed       Past Medical History:  Diagnosis Date  . COPD (chronic obstructive pulmonary disease) ([place])   . Gout   . Hyperlipemia   . Hypertension     History reviewed. No pertinent surgical history.  There were no vitals filed for this visit.  Subjective Assessment - 03/21/18 1825    Subjective  Pt. really sore all over after putting 60 bags of mulch on yard.  No shoulder pain but soreness in muscle.      Pertinent History  pt. known well to PT clinic.  See previous PMHx.      Limitations  Lifting;House hold activities;Other (comment)    Patient Stated Goals  Increase L shoulder ROM/ strength to improve pain-free mobility and return to playing golf.      Currently in Pain?  No/denies         Manual tx.:  Supine L shoulder AA/PROM all planes of movement (19 min.). Supine R shoulder AP/PA/inf. Grade III mobs. 3x30 sec.   R sidelying L scapular mobs./ STM to L UT/deltoid/prox. Biceps region. Discussed HEP   PT Long Term Goals - 03/06/18 1521      PT LONG TERM GOAL #1   Title  Pt. I with HEP to increase L shoulder PROM to [facility] as compared to R shoulder to improve progression to resisted ex./ strength training.      Baseline  Supine L shoulder AROM: flexion 132 deg., abd. 110 deg., ER 31 deg., IR 74 deg. Seated L shoulder AROM: flexion 88 deg., abd.  82 deg; 4/22: flexion 134 deg., abd. 102 deg., ER 26 deg    Time  4    Period  Weeks    Status  Partially Met      PT LONG TERM GOAL #2   Title  Pt. will reports no L shoulder pain/ tenderness with palpation to improve progression to isometrics.      Baseline  No tenderness with palpation to L sh. today.      Time  4    Period  Weeks    Status  Achieved      PT LONG TERM GOAL #3   Title  Pt. will complete QuickDASH and score <30% to improve functional mobility with UE.      Baseline  QuickDASH: 40 on 01/04/18; 4/22: 11.36%    Time  4    Period  Weeks    Status  Achieved      PT LONG TERM GOAL #4   Title  Pt. will increase to full L shoulder AROM/ isometrics with no increase c/o pain per MD protocol to promote return to househould/ work-related tasks.     Baseline  Supine L shoulder AROM: flexion 132 deg.,  abd. 110 deg., ER 31 deg., IR 74 deg. Seated L shoulder AROM: flexion 88 deg., abd. 82 deg; 4/22: pt denies pain with strengthening exercises (see above for measurements)    Time  4    Period  Weeks    Status  Partially Met      PT LONG TERM GOAL #5   Title  Pt. able to return to playing 18 holes of golf with no increase c/o L shoulder issues/ radicular symptoms.      Baseline  has not returned to golf at this time; 4/22: pt has returned to playing 9 holes without pain    Time  4    Period  Weeks    Status  Partially Met            Plan - 03/21/18 1827    Clinical Impression Statement  No ther.ex. today due to patient initially presenting with generalized muscle soreness/ fatigue due to yardwork.  PT focused on increasing L shoulder ROM/ mobility.  PT encouraged pt. to continue with HEP/ strengthening ex. as tolerated.  No changes to HEP at this time.      Clinical Presentation  Stable    Clinical Decision Making  Low    Rehab Potential  Good    PT Frequency  2x / week    PT Duration  4 weeks    PT Treatment/Interventions  Aquatic Therapy;Cryotherapy;Moist  Heat;Electrical Stimulation;Ultrasound;Neuromuscular re-education;Balance training;Therapeutic exercise;Manual techniques;Patient/family education;Therapeutic activities    PT Next Visit Plan  Manual therapy to improve ROM/ gross shoulder strengthening with Nautlius.  Return to [place]  See HEP    Consulted and Agree with Plan of Care  Patient;Family member/caregiver       Patient will benefit from skilled therapeutic intervention in order to improve the following deficits and impairments:  Pain, Improper body mechanics, Decreased coordination, Decreased mobility, Decreased scar mobility, Postural dysfunction, Decreased strength, Decreased range of motion, Decreased endurance, Impaired UE functional use, Hypomobility  Visit Diagnosis: S/P left rotator cuff repair  Shoulder joint stiffness, left  Muscle weakness (generalized)  Chronic left shoulder pain  Joint stiffness     Problem List There are no active problems to display for this patient.  Pura Spice, PT, DPT # [phone] 03/21/2018, 6:40 PM  [facility] [address] [place], [place], [address] Phone: [phone]   Fax:  [phone]  Name: Sean Hubbard MRN: [id number] Date of Birth: [birth year]

## 2018-03-22 ENCOUNTER — Ambulatory Visit: Payer: Medicare Other | Admitting: Physical Therapy

## 2018-03-22 DIAGNOSIS — Z9889 Other specified postprocedural states: Secondary | ICD-10-CM

## 2018-03-22 DIAGNOSIS — G8929 Other chronic pain: Secondary | ICD-10-CM

## 2018-03-22 DIAGNOSIS — M6281 Muscle weakness (generalized): Secondary | ICD-10-CM

## 2018-03-22 DIAGNOSIS — M25612 Stiffness of left shoulder, not elsewhere classified: Secondary | ICD-10-CM

## 2018-03-22 DIAGNOSIS — M25512 Pain in left shoulder: Secondary | ICD-10-CM

## 2018-03-24 ENCOUNTER — Ambulatory Visit: Payer: Medicare Other | Admitting: Physical Therapy

## 2018-03-25 ENCOUNTER — Encounter: Payer: Self-pay | Admitting: Physical Therapy

## 2018-03-25 NOTE — Therapy (Signed)
[facility] [address] [place], [place], [address] Phone: [phone]   Fax:  [phone]  Physical Therapy Treatment  Patient Details  Name: Sean Hubbard MRN: [id number] Date of Birth: 16-Aug-1941 Referring Provider: Vonna Drafts, MD   Encounter Date: 03/22/2018  PT End of Session - 03/25/18 0745    Visit Number  37    Number of Visits  41    Date for PT Re-Evaluation  04/03/18    PT Start Time  6237    PT Stop Time  1523    PT Time Calculation (min)  58 min    Activity Tolerance  Patient tolerated treatment well    Behavior During Therapy  [facility] for tasks assessed/performed       Past Medical History:  Diagnosis Date  . COPD (chronic obstructive pulmonary disease) ([place])   . Gout   . Hyperlipemia   . Hypertension     History reviewed. No pertinent surgical history.  There were no vitals filed for this visit.  Subjective Assessment - 03/25/18 0744    Subjective  Pt. had a good weekend out of town.  Limited compliance with shoulder ex.  Pt. planning on playing golf this week/ weekend.      Pertinent History  pt. known well to PT clinic.  See previous PMHx.      Limitations  Lifting;House hold activities;Other (comment)    Patient Stated Goals  Increase L shoulder ROM/ strength to improve pain-free mobility and return to playing golf.      Currently in Pain?  Yes    Pain Score  2     Pain Location  Shoulder    Pain Orientation  Left    Pain Descriptors / Indicators  Sore    Pain Type  Chronic pain           There.ex.:  B UBE 3 min. f/b (warm-up/ no charge)   Supine/ seated weighted wand shoulder flexion/ press-ups/ tricep ext.   Standing Nautilus: 60# lat. Pull downs/ 40# scap. Retraction/ 30# B sh. Adduction with handles/ 20-30# chest press with bar 20x each.    Supine L shoulder rhythmic stabs/ isometrics all planes.    Manual tx.:  Grade III-IV L shoulder AP mobs 4x30 seconds?  Grade  III-IV L shoulder inferior mobs 4x30 seconds?  Supine L shoulder AA/PROM flexion/ abd with pec. STM 10x each. ?  STM to L shoulder/ prox. Biceps/ triceps.       PT Long Term Goals - 03/06/18 1521      PT LONG TERM GOAL #1   Title  Pt. I with HEP to increase L shoulder PROM to [facility] as compared to R shoulder to improve progression to resisted ex./ strength training.      Baseline  Supine L shoulder AROM: flexion 132 deg., abd. 110 deg., ER 31 deg., IR 74 deg. Seated L shoulder AROM: flexion 88 deg., abd. 82 deg; 4/22: flexion 134 deg., abd. 102 deg., ER 26 deg    Time  4    Period  Weeks    Status  Partially Met      PT LONG TERM GOAL #2   Title  Pt. will reports no L shoulder pain/ tenderness with palpation to improve progression to isometrics.      Baseline  No tenderness with palpation to L sh. today.      Time  4    Period  Weeks    Status  Achieved      PT LONG TERM GOAL #3   Title  Pt. will complete QuickDASH and score <30% to improve functional mobility with UE.      Baseline  QuickDASH: 40 on 01/04/18; 4/22: 11.36%    Time  4    Period  Weeks    Status  Achieved      PT LONG TERM GOAL #4   Title  Pt. will increase to full L shoulder AROM/ isometrics with no increase c/o pain per MD protocol to promote return to househould/ work-related tasks.     Baseline  Supine L shoulder AROM: flexion 132 deg., abd. 110 deg., ER 31 deg., IR 74 deg. Seated L shoulder AROM: flexion 88 deg., abd. 82 deg; 4/22: pt denies pain with strengthening exercises (see above for measurements)    Time  4    Period  Weeks    Status  Partially Met      PT LONG TERM GOAL #5   Title  Pt. able to return to playing 18 holes of golf with no increase c/o L shoulder issues/ radicular symptoms.      Baseline  has not returned to golf at this time; 4/22: pt has returned to playing 9 holes without pain    Time  4    Period  Weeks    Status  Partially Met            Plan - 03/25/18 0746     Clinical Impression Statement  Pt. remains limited with L shoulder flexion due to capsular tightness/ pain with >90 deg. flexion.  Good progression with shoulder flexion/ ER strengthening ex.  Decrease neck/back discomfort during tx. session today.  Probable discharge with HEP focus after next tx. session.      Clinical Presentation  Stable    Clinical Decision Making  Low    Rehab Potential  Good    PT Frequency  2x / week    PT Duration  4 weeks    PT Treatment/Interventions  Aquatic Therapy;Cryotherapy;Moist Heat;Electrical Stimulation;Ultrasound;Neuromuscular re-education;Balance training;Therapeutic exercise;Manual techniques;Patient/family education;Therapeutic activities    PT Next Visit Plan  Reviewe HEP in depth/ Probable discharge next visit    PT Home Exercise Plan  See HEP    Consulted and Agree with Plan of Care  Patient;Family member/caregiver       Patient will benefit from skilled therapeutic intervention in order to improve the following deficits and impairments:  Pain, Improper body mechanics, Decreased coordination, Decreased mobility, Decreased scar mobility, Postural dysfunction, Decreased strength, Decreased range of motion, Decreased endurance, Impaired UE functional use, Hypomobility  Visit Diagnosis: S/P left rotator cuff repair  Shoulder joint stiffness, left  Muscle weakness (generalized)  Chronic left shoulder pain     Problem List There are no active problems to display for this patient.  Pura Spice, PT, DPT # [id number] 03/25/2018, 8:00 AM  [facility] [address] [place], [place], [address] Phone: [phone]   Fax:  [phone]  Name: Sean Hubbard MRN: [id number] Date of Birth: 22-Sep-1941

## 2019-01-16 NOTE — Progress Notes (Signed)
Cardiology Office Note  Date:  01/18/2019   ID:  Sean Hubbard, DOB: 12-05-1940, MRN: [id number]  PCP:  Sean Ferrier, MD   Chief Complaint  Patient presents with  . New Patient (Initial Visit)    BPPV HX Dr. Valentino Saxon. Medications verbally reviewed with patient.    HPI:  Sean Hubbard is a 78 y.o. 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 Valentino Saxon. He is feeling well overall today. He received 4 stents in right coronary artery in [place] back in 07/18/1996. He has had shortness of breath all his life. There was a stress test done in 2015 that showed small area of inferior reversible defect of moderate intensity consistent with ischemia.   Former smoker, used to smoke 4 PPD before quitting in September 1997. Was diagnosed with COPD at one point, but new PCP said he does not have COPD.   Plays golf two times a weeks with no issue. He goes fishing as well. He is planning on returning to walk 9 holes while playing golf. He has some knee pain. Does swimming everyday during the Summer. Used to play tennis, 5 rounds. Denies any symptoms concerning for angina  He takes aspirin 81 mg daily.   Today's Blood pressure 142/74 Total Chol 152/ LDL 88 HBA1C 5.7 CR 1.0 Glucose 106   EKG personally reviewed by myself on todays visit Shows normal sinus rhythm. 73 bpm. Consider septal infarct, age undetermined.    OTHER PAST MEDICAL HISTORY REVIEWED BY ME FOR TODAY'S  VISIT: He received 4 stents in right coronary artery in [place] back in 07/18/1996.  There was a stress test done in 2015 that showed small area of inferior reversible defect of moderate intensity consistent with ischemia  PMH:   has a past medical history of COPD (chronic obstructive pulmonary disease) (HCC), Gout, Hyperlipemia, and Hypertension.  PSH:    Past Surgical History:  Procedure Laterality Date  . CARDIAC CATHETERIZATION    . CORONARY ANGIOPLASTY WITH STENT PLACEMENT  07/18/1996   x 4 to RCA in [place] Dr. Edilia Bo  . ROTATOR CUFF REPAIR Left     Current Outpatient Medications  Medication Sig Dispense Refill  . allopurinol (ZYLOPRIM) 100 MG tablet Take 100 mg by mouth daily.    . metoprolol succinate (TOPROL-XL) 50 MG 24 hr tablet Take 50 mg by mouth daily. Take with or immediately following a meal.    . rosuvastatin (CRESTOR) 20 MG tablet Take 20 mg by mouth daily.    Marland Kitchen ezetimibe (ZETIA) 10 MG tablet Take 1 tablet (10 mg total) by mouth daily. 90 tablet 3   No current facility-administered medications for this visit.      ALLERGIES:   Patient has no known allergies.   SOCIAL HISTORY:  The patient  reports that he has never smoked. He does not have any smokeless tobacco history on file. He reports current alcohol use of about 1.0 standard drinks of alcohol per week.   FAMILY HISTORY:   family history includes Cancer in his father and mother.  REVIEW OF SYSTEMS: Review of Systems  Constitutional: Negative.   Eyes: Negative.   Respiratory: Negative.   Cardiovascular: Negative.   Gastrointestinal: Negative.   Genitourinary: Negative.   Musculoskeletal: Negative.   Neurological: Negative.   Psychiatric/Behavioral: Negative.   All other systems reviewed and are negative.   PHYSICAL EXAM: VS:  BP (!) 142/74 (BP Location: Right Arm, Patient Position: Sitting, Cuff Size: Normal)   Pulse 73   Ht 5\' 10"  (1.778 m)   Wt 255 lb (115.7 kg)   BMI 36.59 kg/m  ,  BMI Body mass index is 36.59 kg/m.  GEN: Well nourished, well developed, in no acute distress HEENT: normal Neck: no JVD, carotid bruits, or masses Cardiac: RRR; 2/6 or 3/6 murmur on aortic valve, rubs, or gallops,no edema  Respiratory:  clear to auscultation bilaterally, normal work of breathing GI: soft, nontender, nondistended, + BS MS: no deformity or atrophy Skin: warm and dry, no rash Neuro:  Strength and sensation are intact Psych: euthymic mood, full affect  RECENT LABS: No results found for requested labs within last 8760 hours.    LIPID PANEL: No results found for: CHOL, HDL, LDLCALC, TRIG Lab work reviewed from primary care system LDL 88  WEIGHT: Wt Readings from Last 3 Encounters:  01/18/19 255 lb (115.7 kg)  03/17/15 245 lb (111.1 kg)       ASSESSMENT AND PLAN:  1. Coronary artery disease of native artery of native heart with stable angina pectoris (HCC) Recommended weight loss for borderline diabetes control, Cholesterol not at goal, recommended he add Zetia 10 mg daily to his statin to achieve LDL less than 70 Discussed anginal symptoms to watch for No stress test ordered at this time  2. Morbid obesity (HCC) Recommended dietary changes, exercise program, low carb diet  3. Aortic valve stenosis, etiology of cardiac valve disease unspecified Echocardiogram has been ordered given significant murmur appreciated on exam concerning for aortic valve stenosis Reports he is relatively asymptomatic - ECHOCARDIOGRAM COMPLETE; Future  4. Mixed hyperlipidemia Zetia added to statin to achieve goal LDL preferably in the low 29J  5. Elevated glucose Numbers discussed with him Recommend a low carbohydrate diet and exercise program   Disposition:   F/U  12 months  Long discussion with him concerning his prior cardiac history, aortic valve stenosis and options if this gets severe, management of diet, glucose levels, cholesterol Records requested from outside  cardiology Total encounter time more than 60 minutes. Greater than 50% was spent in counseling and coordination of care with the patient.    Orders Placed This Encounter  Procedures  . ECHOCARDIOGRAM COMPLETE    I, Jesus Reyes am acting as a scribe for Julien Nordmann, M.D., Ph.D.  I, Julien Nordmann, M.D. Ph.D., have reviewed the above documentation for accuracy and completeness, and I agree with the above.   Signed, Dossie Arbour, M.D., Ph.D. 01/18/2019  [facility] [place], [place] [phone]

## 2019-01-18 ENCOUNTER — Encounter: Payer: Self-pay | Admitting: Cardiovascular Disease

## 2019-01-18 ENCOUNTER — Ambulatory Visit (INDEPENDENT_AMBULATORY_CARE_PROVIDER_SITE_OTHER): Payer: Medicare Other | Admitting: Cardiovascular Disease

## 2019-01-18 VITALS — BP 142/74 | HR 73 | Ht 70.0 in | Wt 255.0 lb

## 2019-01-18 DIAGNOSIS — E782 Mixed hyperlipidemia: Secondary | ICD-10-CM | POA: Diagnosis not present

## 2019-01-18 DIAGNOSIS — I350 Nonrheumatic aortic (valve) stenosis: Secondary | ICD-10-CM | POA: Diagnosis not present

## 2019-01-18 DIAGNOSIS — I25118 Atherosclerotic heart disease of native coronary artery with other forms of angina pectoris: Secondary | ICD-10-CM | POA: Diagnosis not present

## 2019-01-18 DIAGNOSIS — R7309 Other abnormal glucose: Secondary | ICD-10-CM

## 2019-01-18 MED ORDER — EZETIMIBE 10 MG PO TABS: 10.0000 mg | ORAL_TABLET | Freq: Every day | ORAL | 3 refills | Status: DC

## 2019-01-18 NOTE — Patient Instructions (Addendum)
Medication Instructions:  Your physician has recommended you make the following change in your medication:  1. START Zetia once a day for cholesterol 2. STAY on Crestor   If you need a refill on your cardiac medications before your next appointment, please call your pharmacy.    Lab work: No new labs needed   If you have labs (blood work) drawn today and your tests are completely normal, you will receive your results only by: Marland Kitchen MyChart Message (if you have MyChart) OR . A paper copy in the mail If you have any lab test that is abnormal or we need to change your treatment, we will call you to review the results.   Testing/Procedures: We will order an echocardiogram for aortic valve stenosis Your physician has requested that you have an echocardiogram. Echocardiography is a painless test that uses sound waves to create images of your heart. It provides your doctor with information about the size and shape of your heart and how well your heart's chambers and valves are working. This procedure takes approximately one hour. There are no restrictions for this procedure.     Follow-Up: At [facility], you and your health needs are our priority.  As part of our continuing mission to provide you with exceptional heart care, we have created designated Provider Care Teams.  These Care Teams include your primary Cardiologist (physician) and Advanced Practice Providers (APPs -  Physician Assistants and Nurse Practitioners) who all work together to provide you with the care you need, when you need it.  . You will need a follow up appointment in 12 months .   Please call our office 2 months in advance to schedule this appointment.    . Providers on your designated Care Team:   . Nicolasa Ducking, NP . Eula Listen, PA-C . Marisue Ivan, PA-C  Any Other Special Instructions Will Be Listed Below (If Applicable).  For educational health videos Log in to : www.myemmi.com Or :  [email or web address], password : [email or web address]   Echocardiogram An echocardiogram is a procedure that uses painless sound waves (ultrasound) to produce an image of the heart. Images from an echocardiogram can provide important information about:  Signs of coronary artery disease (CAD).  Aneurysm detection. An aneurysm is a weak or damaged part of an artery wall that bulges out from the normal force of blood pumping through the body.  Heart size and shape. Changes in the size or shape of the heart can be associated with certain conditions, including heart failure, aneurysm, and CAD.  Heart muscle function.  Heart valve function.  Signs of a past heart attack.  Fluid buildup around the heart.  Thickening of the heart muscle.  A tumor or infectious growth around the heart valves. Tell a health care provider about:  Any allergies you have.  All medicines you are taking, including vitamins, herbs, eye drops, creams, and over-the-counter medicines.  Any blood disorders you have.  Any surgeries you have had.  Any medical conditions you have.  Whether you are pregnant or may be pregnant. What are the risks? Generally, this is a safe procedure. However, problems may occur, including:  Allergic reaction to dye (contrast) that may be used during the procedure. What happens before the procedure? No specific preparation is needed. You may eat and drink normally. What happens during the procedure?   An IV tube may be inserted into one of your veins.  You may receive contrast through this tube. A contrast is an injection  that improves the quality of the pictures from your heart.  A gel will be applied to your chest.  A wand-like tool (transducer) will be moved over your chest. The gel will help to transmit the sound waves from the transducer.  The sound waves will harmlessly bounce off of your heart to allow the heart images to be captured in real-time motion. The images will be recorded on  a computer. The procedure may vary among health care providers and hospitals. What happens after the procedure?  You may return to your normal, everyday life, including diet, activities, and medicines, unless your health care provider tells you not to do that. Summary  An echocardiogram is a procedure that uses painless sound waves (ultrasound) to produce an image of the heart.  Images from an echocardiogram can provide important information about the size and shape of your heart, heart muscle function, heart valve function, and fluid buildup around your heart.  You do not need to do anything to prepare before this procedure. You may eat and drink normally.  After the echocardiogram is completed, you may return to your normal, everyday life, unless your health care provider tells you not to do that. This information is not intended to replace advice given to you by your health care provider. Make sure you discuss any questions you have with your health care provider. Document Released: 10/29/2000 Document Revised: 12/04/2016 Document Reviewed: 12/04/2016 Elsevier Interactive Patient Education  2019 [employer].

## 2019-01-19 NOTE — Addendum Note (Signed)
Addended by: Aurelio Jew on: 01/19/2019 01:50 PM   Modules accepted: Orders

## 2019-02-26 ENCOUNTER — Other Ambulatory Visit: Payer: 59

## 2019-04-30 ENCOUNTER — Other Ambulatory Visit: Payer: 59

## 2019-05-02 ENCOUNTER — Other Ambulatory Visit: Payer: 59

## 2019-06-07 ENCOUNTER — Other Ambulatory Visit: Payer: 59

## 2019-08-28 ENCOUNTER — Other Ambulatory Visit: Payer: Self-pay | Admitting: Internal Medicine

## 2019-08-28 DIAGNOSIS — I10 Essential (primary) hypertension: Secondary | ICD-10-CM

## 2019-08-28 DIAGNOSIS — I2510 Atherosclerotic heart disease of native coronary artery without angina pectoris: Secondary | ICD-10-CM

## 2019-08-28 DIAGNOSIS — R42 Dizziness and giddiness: Secondary | ICD-10-CM

## 2019-09-05 ENCOUNTER — Ambulatory Visit
Admission: RE | Admit: 2019-09-05 | Discharge: 2019-09-05 | Disposition: A | Discharge status: Home / Self Care | Payer: Medicare Other | Source: Ambulatory Visit | Attending: Internal Medicine | Admitting: Internal Medicine

## 2019-09-05 ENCOUNTER — Other Ambulatory Visit: Payer: Self-pay

## 2019-09-05 DIAGNOSIS — R42 Dizziness and giddiness: Secondary | ICD-10-CM | POA: Insufficient documentation

## 2019-09-05 DIAGNOSIS — I10 Essential (primary) hypertension: Secondary | ICD-10-CM

## 2019-09-05 DIAGNOSIS — I2510 Atherosclerotic heart disease of native coronary artery without angina pectoris: Secondary | ICD-10-CM | POA: Diagnosis present

## 2020-01-27 ENCOUNTER — Encounter: Payer: Self-pay | Admitting: Emergency Medicine

## 2020-01-27 ENCOUNTER — Other Ambulatory Visit: Payer: Self-pay

## 2020-01-27 ENCOUNTER — Ambulatory Visit
Admission: EM | Admit: 2020-01-27 | Discharge: 2020-01-27 | Disposition: A | Discharge status: Home / Self Care | Payer: Medicare Other | Attending: Family Medicine | Admitting: Family Medicine

## 2020-01-27 DIAGNOSIS — K047 Periapical abscess without sinus: Secondary | ICD-10-CM | POA: Diagnosis not present

## 2020-01-27 MED ORDER — AMOXICILLIN-POT CLAVULANATE 875-125 MG PO TABS: 1.0000 | ORAL_TABLET | Freq: Two times a day (BID) | ORAL | 0 refills | Status: DC

## 2020-01-27 NOTE — ED Triage Notes (Signed)
Patient c/o left sided facial pain and swelling and pain in his gums that started yesterday.  Patient denies fevers.

## 2020-01-27 NOTE — Discharge Instructions (Addendum)
Please take antibiotics as prescribed.  Return to the ER or urgent care for any increasing pain, swelling or fevers or difficulty swallowing.  Follow-up with dental clinic in 3 to 4 days for recheck.

## 2020-01-27 NOTE — ED Provider Notes (Signed)
[facility]    CSN: [id number] Arrival date & time: 01/27/20  1204      History   Chief Complaint Chief Complaint  Patient presents with  . Facial Pain    left  . Dental Pain    HPI Sean Hubbard is a 79 y.o. male presents to the urgent care facility for evaluation of left-sided facial pain, swelling.  He believes he has a dental infection.  Having some left upper gum swelling, pain with no drainage or fluctuance.  No fevers.  Did have a dental procedure performed a week ago, states he had a cath that fell off and had this replaced.  HPI  Past Medical History:  Diagnosis Date  . COPD (chronic obstructive pulmonary disease) (HCC)   . Gout   . Hyperlipemia   . Hypertension     There are no problems to display for this patient.   Past Surgical History:  Procedure Laterality Date  . CARDIAC CATHETERIZATION    . CORONARY ANGIOPLASTY WITH STENT PLACEMENT  07/18/1996   x 4 to RCA in [place] Dr. Edilia Bo  . ROTATOR CUFF REPAIR Left        Home Medications    Prior to Admission medications   Medication Sig Start Date End Date Taking? Authorizing Provider  allopurinol (ZYLOPRIM) 100 MG tablet Take 100 mg by mouth daily.   Yes [provider]  ezetimibe (ZETIA) 10 MG tablet Take 1 tablet (10 mg total) by mouth daily. 01/18/19 01/27/20 Yes Gollan, Tollie Pizza, MD  metoprolol succinate (TOPROL-XL) 50 MG 24 hr tablet Take 50 mg by mouth daily. Take with or immediately following a meal.   Yes [provider]  rosuvastatin (CRESTOR) 20 MG tablet Take 20 mg by mouth daily.   Yes [provider]  amoxicillin-clavulanate (AUGMENTIN) 875-125 MG tablet Take 1 tablet by mouth every 12 (twelve) hours. 7 days 01/27/20   Evon Slack, PA-C    Family History Family History  Problem Relation Age of Onset  . Cancer Mother   . Cancer Father     Social History Social History   Tobacco Use  . Smoking status: Never Smoker  . Smokeless tobacco:  Never Used  Substance Use Topics  . Alcohol use: Yes    Alcohol/week: 1.0 standard drinks    Types: 1 Shots of liquor per week  . Drug use: Never     Allergies   Patient has no known allergies.   Review of Systems Review of Systems  Constitutional: Negative.  Negative for chills and fever.  HENT: Positive for dental problem and facial swelling. Negative for drooling, mouth sores, trouble swallowing and voice change.   Respiratory: Negative for shortness of breath.   Cardiovascular: Negative for chest pain.  Gastrointestinal: Negative for nausea and vomiting.  Musculoskeletal: Negative for arthralgias, neck pain and neck stiffness.  Skin: Negative.  Negative for wound.  Neurological: Negative for light-headedness.  Psychiatric/Behavioral: Negative for confusion.  All other systems reviewed and are negative.    Physical Exam Triage Vital Signs ED Triage Vitals  Enc Vitals Group     BP 01/27/20 1223 130/77     Pulse Rate 01/27/20 1223 72     Resp 01/27/20 1223 16     Temp 01/27/20 1223 98.3 F (36.8 C)     Temp Source 01/27/20 1223 Oral     SpO2 01/27/20 1223 98 %     Weight 01/27/20 1220 248 lb (112.5 kg)  Height 01/27/20 1220 5\' 9"  (1.753 m)     Head Circumference --      Peak Flow --      Pain Score 01/27/20 1220 2     Pain Loc --      Pain Edu? --      Excl. in [place]? --    No data found.  Updated Vital Signs BP 130/77 (BP Location: Right Arm)   Pulse 72   Temp 98.3 F (36.8 C) (Oral)   Resp 16   Ht 5\' 9"  (1.753 m)   Wt 248 lb (112.5 kg)   SpO2 98%   BMI 36.62 kg/m   Visual Acuity Right Eye Distance:   Left Eye Distance:   Bilateral Distance:    Right Eye Near:   Left Eye Near:    Bilateral Near:     Physical Exam Constitutional:      Appearance: He is well-developed.  HENT:     Head: Normocephalic and atraumatic.     Comments: Mild left sided facial swelling above the left upper incisor, no fluctuance, mild tenderness to palpation to the  incisor.  No sign of abscess formation.  No trismus nor pharyngeal erythema. Eyes:     Conjunctiva/sclera: Conjunctivae normal.  Cardiovascular:     Rate and Rhythm: Normal rate.  Pulmonary:     Effort: Pulmonary effort is normal. No respiratory distress.  Musculoskeletal:        General: Normal range of motion.     Cervical back: Normal range of motion.  Skin:    General: Skin is warm.     Findings: No rash.  Neurological:     Mental Status: He is alert and oriented to person, place, and time.  Psychiatric:        Behavior: Behavior normal.        Thought Content: Thought content normal.      UC Treatments / Results  Labs (all labs ordered are listed, but only abnormal results are displayed) Labs Reviewed - No data to display  EKG   Radiology No results found.  Procedures Procedures (including critical care time)  Medications Ordered in UC Medications - No data to display  Initial Impression / Assessment and Plan / UC Course  I have reviewed the triage vital signs and the nursing notes.  Pertinent labs & imaging results that were available during my care of the patient were reviewed by me and considered in my medical decision making (see chart for details).     79 year old male with dental cellulitis.  Having some upper dental discomfort with mild soft tissue swelling.  No localized abscess formation found.  He is started on Augmentin.  Will follow with dental clinic.  He understands signs symptoms return to ER for. Final Clinical Impressions(s) / UC Diagnoses   Final diagnoses:  Dental infection     Discharge Instructions     Please take antibiotics as prescribed.  Return to the ER or urgent care for any increasing pain, swelling or fevers or difficulty swallowing.  Follow-up with dental clinic in 3 to 4 days for recheck.   ED Prescriptions    Medication Sig Dispense Auth. Provider   amoxicillin-clavulanate (AUGMENTIN) 875-125 MG tablet Take 1 tablet by  mouth every 12 (twelve) hours. 7 days 14 tablet Duanne Guess, [place]     PDMP not reviewed this encounter.   Derrion, Tritz, [place] 01/27/20 1232

## 2020-01-31 ENCOUNTER — Other Ambulatory Visit: Payer: Self-pay | Admitting: Cardiovascular Disease

## 2020-04-07 ENCOUNTER — Telehealth: Payer: Self-pay | Admitting: Cardiovascular Disease

## 2020-04-07 NOTE — Telephone Encounter (Signed)
Attempted to schedule no ans no vm  

## 2020-04-07 NOTE — Telephone Encounter (Signed)
-----   Message from Andi Devon sent at 04/07/2020 11:49 AM EDT ----- Regarding: appointment needed Please schedule appointment for refills. Thank you!

## 2020-04-08 NOTE — Telephone Encounter (Signed)
LVM

## 2020-04-08 NOTE — Telephone Encounter (Signed)
Spoke with patient's wife who states she will let her husband know he is due to F/U with Dr. Mariah Milling.

## 2020-05-02 NOTE — Telephone Encounter (Signed)
Unable to reach closing encounter 

## 2020-05-07 ENCOUNTER — Other Ambulatory Visit: Payer: Self-pay

## 2020-05-07 ENCOUNTER — Encounter: Payer: Self-pay | Admitting: Family

## 2020-05-07 ENCOUNTER — Ambulatory Visit (INDEPENDENT_AMBULATORY_CARE_PROVIDER_SITE_OTHER): Payer: Medicare Other | Admitting: Family

## 2020-05-07 VITALS — BP 154/86 | HR 82 | Ht 68.5 in | Wt 246.1 lb

## 2020-05-07 DIAGNOSIS — I350 Nonrheumatic aortic (valve) stenosis: Secondary | ICD-10-CM | POA: Diagnosis not present

## 2020-05-07 DIAGNOSIS — E782 Mixed hyperlipidemia: Secondary | ICD-10-CM

## 2020-05-07 DIAGNOSIS — I25118 Atherosclerotic heart disease of native coronary artery with other forms of angina pectoris: Secondary | ICD-10-CM | POA: Diagnosis not present

## 2020-05-07 DIAGNOSIS — R0602 Shortness of breath: Secondary | ICD-10-CM

## 2020-05-07 NOTE — Patient Instructions (Addendum)
Medication Instructions:  - Your physician recommends that you continue on your current medications as directed. Please refer to the Current Medication list given to you today.   *If you need a refill on your cardiac medications before your next appointment, please call your pharmacy*   Lab Work: - Your physician recommends that you have lab work today: BMP/ BNP/ CBC  If you have labs (blood work) drawn today and your tests are completely normal, you will receive your results only by: Marland Kitchen MyChart Message (if you have MyChart) OR . A paper copy in the mail If you have any lab test that is abnormal or we need to change your treatment, we will call you to review the results.   Testing/Procedures: - Your physician has requested that you have an echocardiogram>> Tuesday 05/13/20 @ 4:00 pm [facility] office). Echocardiography is a painless test that uses sound waves to create images of your heart. It provides your doctor with information about the size and shape of your heart and how well your heart's chambers and valves are working. This procedure takes approximately one hour. There are no restrictions for this procedure.   Follow-Up: At [facility], you and your health needs are our priority.  As part of our continuing mission to provide you with exceptional heart care, we have created designated Provider Care Teams.  These Care Teams include your primary Cardiologist (physician) and Advanced Practice Providers (APPs -  Physician Assistants and Nurse Practitioners) who all work together to provide you with the care you need, when you need it.  We recommend signing up for the patient portal called "MyChart".  Sign up information is provided on this After Visit Summary.  MyChart is used to connect with patients for Virtual Visits (Telemedicine).  Patients are able to view lab/test results, encounter notes, upcoming appointments, etc.  Non-urgent messages can be sent to your provider as well.   To  learn more about what you can do with MyChart, go to [email or web address].    Your next appointment:   1 week(s)  The format for your next appointment:   In Person  Provider:    You may see Ida Rogue, MD or one of the following Advanced Practice Providers on your designated Care Team:    Murray Hodgkins, NP  Christell Faith, PA-C  Marrianne Mood, PA-C    Other Instructions   Echocardiogram An echocardiogram is a procedure that uses painless sound waves (ultrasound) to produce an image of the heart. Images from an echocardiogram can provide important information about:  Signs of coronary artery disease (CAD).  Aneurysm detection. An aneurysm is a weak or damaged part of an artery wall that bulges out from the normal force of blood pumping through the body.  Heart size and shape. Changes in the size or shape of the heart can be associated with certain conditions, including heart failure, aneurysm, and CAD.  Heart muscle function.  Heart valve function.  Signs of a past heart attack.  Fluid buildup around the heart.  Thickening of the heart muscle.  A tumor or infectious growth around the heart valves. Tell a health care provider about:  Any allergies you have.  All medicines you are taking, including vitamins, herbs, eye drops, creams, and over-the-counter medicines.  Any blood disorders you have.  Any surgeries you have had.  Any medical conditions you have.  Whether you are pregnant or may be pregnant. What are the risks? Generally, this is a safe procedure. However, problems may  occur, including:  Allergic reaction to dye (contrast) that may be used during the procedure. What happens before the procedure? No specific preparation is needed. You may eat and drink normally. What happens during the procedure?   An IV tube may be inserted into one of your veins.  You may receive contrast through this tube. A contrast is an injection that improves  the quality of the pictures from your heart.  A gel will be applied to your chest.  A wand-like tool (transducer) will be moved over your chest. The gel will help to transmit the sound waves from the transducer.  The sound waves will harmlessly bounce off of your heart to allow the heart images to be captured in real-time motion. The images will be recorded on a computer. The procedure may vary among health care providers and hospitals. What happens after the procedure?  You may return to your normal, everyday life, including diet, activities, and medicines, unless your health care provider tells you not to do that. Summary  An echocardiogram is a procedure that uses painless sound waves (ultrasound) to produce an image of the heart.  Images from an echocardiogram can provide important information about the size and shape of your heart, heart muscle function, heart valve function, and fluid buildup around your heart.  You do not need to do anything to prepare before this procedure. You may eat and drink normally.  After the echocardiogram is completed, you may return to your normal, everyday life, unless your health care provider tells you not to do that. This information is not intended to replace advice given to you by your health care provider. Make sure you discuss any questions you have with your health care provider. Document Revised: 02/22/2019 Document Reviewed: 12/04/2016 Elsevier Patient Education  2020 [employer].

## 2020-05-07 NOTE — Progress Notes (Signed)
Office Visit    Patient Name: Sean Hubbard Date of Encounter: 05/07/2020  Primary Care Provider:  Adin Hector, MD Primary Cardiologist:  No primary care provider on file. Electrophysiologist:  None    Chief Complaint    Sean Hubbard is a 79 y.o. male with a hx of CAD s/p 4 stents to RCA in [place] in 1997, prior tobacco use, valvular disease, HTN, HLD, GERD, COPD, BPPV presents today for lightheadedness and dyspnea.    Past Medical History    Past Medical History:  Diagnosis Date   COPD (chronic obstructive pulmonary disease) ([place])    Gout    Hyperlipemia    Hypertension    Past Surgical History:  Procedure Laterality Date   CARDIAC CATHETERIZATION     CORONARY ANGIOPLASTY WITH STENT PLACEMENT  07/18/1996   x 4 to [place]CA in [place] Dr. Ursula Alert CUFF REPAIR Left     Allergies  No Known Allergies  History of Present Illness    Sean Hubbard is a 79 y.o. male with a hx of CAD s/p 4 stents to RCA in [place] in 1997, prior tobacco use, valvular disease, HTN, HLD, GERD, COPD, BPPV last seen 01/18/19 by Dr. Rockey Situ.  Last ischemic eval in 2015 at [facility] by myocardial perfusion study showed small perfusion abnormality of moderate intensity in inferior region of stress images.   At last clinic visit he was recommended for echo new to murmur, but this was not completed.   Reports constant lightheadedness for the last month. 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 No chest pain, pressure, tightness.    Reports tightness in his arms that occurs almost all the time. Has been noticing this for about a month and has gradually been getting worse. No noted aggravating or relieving factors.   Reports fatigue and dyspnea on exertion while they were recently travelling in [place].   Tells me his feet have turned purple. They feel "funny" tells me he feels like he is walking on a rounded floor. No swelling. No leg pain.     BP at home 120/80.  01/13/20  Hb 16.2, K 4.8, ALT 19, AST 19  EKGs/Labs/Other Studies Reviewed:   The following studies were reviewed today:   EKG:  EKG is ordered today.  The ekg ordered today demonstrates NSR 82 bpm with PAC.  Recent Labs: No results found for requested labs within last 8760 hours.  Recent Lipid Panel No results found for: CHOL, TRIG, HDL, CHOLHDL, VLDL, LDLCALC, LDLDIRECT  Home Medications   Current Meds  Medication Sig   allopurinol (ZYLOPRIM) 100 MG tablet Take 100 mg by mouth daily.   atorvastatin (LIPITOR) 40 MG tablet Take 1 tablet by mouth daily.   ezetimibe (ZETIA) 10 MG tablet Take 1 tablet (10 mg total) by mouth daily. Please call to schedule appointment for further refills. Thank you!   metoprolol succinate (TOPROL-XL) 50 MG 24 hr tablet Take 50 mg by mouth daily. Take with or immediately following a meal.   omeprazole (PRILOSEC) 20 MG capsule Take by mouth daily.      Review of Systems       Review of Systems  Constitutional: Positive for malaise/fatigue. Negative for chills and fever.  Cardiovascular: Positive for dyspnea on exertion. Negative for chest pain, leg swelling, near-syncope, orthopnea, palpitations and syncope.  Respiratory: Negative for cough, shortness of breath and wheezing.   Gastrointestinal:  Negative for nausea and vomiting.  Neurological: Positive for light-headedness. Negative for dizziness and weakness.   All other systems reviewed and are otherwise negative except as noted above.  Physical Exam    VS:  BP (!) 154/86 (BP Location: Left Arm, Patient Position: Sitting, Cuff Size: Normal)    Pulse 82    Ht 5' 8.5" (1.74 m)    Wt 246 lb 2 oz (111.6 kg)    SpO2 96%    BMI 36.88 kg/m  , BMI Body mass index is 36.88 kg/m. GEN: Well nourished, well developed, in no acute distress. HEENT: normal. Neck: Supple, no JVD, carotid bruits, or masses. Cardiac: RRR, no rubs, or gallops. Gr 3/6 systolic murmur appreciated  throughout entire cardiac exam. No clubbing, cyanosis, edema.  Radials/DP/PT 2+ and equal bilaterally.  Respiratory:  Respirations regular and unlabored, clear to auscultation bilaterally. GI: Soft, nontender, nondistended, BS + x 4. MS: No deformity or atrophy. Skin: Warm and dry, no rash. Neuro:  Strength and sensation are intact. Psych: Normal affect.   Assessment & Plan    1. Valvular heart disease/DOE/lightheadedness - Gr 3/6 systolic murmur appreciated throughout entire cardiac exam. History of mild aortic stenosis. As he reports lightheadedness and dyspnea, concern for severe aortic stenosis. Plan for echocardiogram. His orthostatic vital signs were negative today.  2. CAD - no chest pain, pressure, tightness. Remote Myoview 2015 with small perfusion defecity. If echo unrevealing, plan for ischemic evaluation in setting of DOE. Cardiac cath may be more beneficial workup than stress test. 3. HTN - BP well controlled.  4. HLD - Continue Atorvastatin and Zetia.   Disposition: Follow up in 1 week(s) with Dr. Mariah Milling or APP   Alver Sorrow, NP 05/07/2020, 4:36 PM

## 2020-05-08 ENCOUNTER — Ambulatory Visit (INDEPENDENT_AMBULATORY_CARE_PROVIDER_SITE_OTHER): Payer: Medicare Other

## 2020-05-08 DIAGNOSIS — I350 Nonrheumatic aortic (valve) stenosis: Secondary | ICD-10-CM | POA: Diagnosis not present

## 2020-05-08 LAB — CBC WITH DIFFERENTIAL/PLATELET
Basophils Absolute: 0 10*3/uL (ref 0.0–0.2)
Basos: 1 %
EOS (ABSOLUTE): 0.1 10*3/uL (ref 0.0–0.4)
Eos: 1 %
Hematocrit: 51.7 % — ABNORMAL HIGH (ref 37.5–51.0)
Hemoglobin: 17.1 g/dL (ref 13.0–17.7)
Immature Grans (Abs): 0 10*3/uL (ref 0.0–0.1)
Immature Granulocytes: 1 %
Lymphocytes Absolute: 0.9 10*3/uL (ref 0.7–3.1)
Lymphs: 16 %
MCH: 30.2 pg (ref 26.6–33.0)
MCHC: 33.1 g/dL (ref 31.5–35.7)
MCV: 91 fL (ref 79–97)
Monocytes Absolute: 0.6 10*3/uL (ref 0.1–0.9)
Monocytes: 11 %
Neutrophils Absolute: 4 10*3/uL (ref 1.4–7.0)
Neutrophils: 70 %
Platelets: 183 10*3/uL (ref 150–450)
RBC: 5.66 x10E6/uL (ref 4.14–5.80)
RDW: 13.2 % (ref 11.6–15.4)
WBC: 5.7 10*3/uL (ref 3.4–10.8)

## 2020-05-08 LAB — BRAIN NATRIURETIC PEPTIDE: BNP: 43.1 pg/mL (ref 0.0–100.0)

## 2020-05-08 LAB — BASIC METABOLIC PANEL
BUN/Creatinine Ratio: 16 (ref 10–24)
BUN: 15 mg/dL (ref 8–27)
CO2: 19 mmol/L — ABNORMAL LOW (ref 20–29)
Calcium: 10 mg/dL (ref 8.6–10.2)
Chloride: 101 mmol/L (ref 96–106)
Creatinine, Ser: 0.91 mg/dL (ref 0.76–1.27)
GFR calc Af Amer: 94 mL/min/{1.73_m2} (ref >59)
GFR calc non Af Amer: 82 mL/min/{1.73_m2} (ref >59)
Glucose: 112 mg/dL — ABNORMAL HIGH (ref 65–99)
Potassium: 4.8 mmol/L (ref 3.5–5.2)
Sodium: 142 mmol/L (ref 134–144)

## 2020-05-09 ENCOUNTER — Telehealth: Payer: Self-pay | Admitting: Family

## 2020-05-09 NOTE — Progress Notes (Signed)
Addendum 05/09/20:  Spoke with patient and his wife.  Reviewed echocardiogram showed normal LVEF 55 to 60%, grade 1 diastolic symptoms, moderate to severe aortic stenosis.  Plan for [facility] for further evaluation of aortic valve as well as reassessment of coronary disease.  Last ischemic eval in 2015.  Symptomatic with dyspnea on exertion, fatigue, lightheadedness.  05/07/2020 CBC and BMP with no acute findings.  Discussed role of cardiac catheterization risks and benefits. The patient understands that risks include but are not limited to stroke (1 in 1000), death (1 in 1000), kidney failure [usually temporary] (1 in 500), bleeding (1 in 200), allergic reaction [possibly serious] (1 in 200), and agrees to proceed.   Alver Sorrow, NP

## 2020-05-09 NOTE — Telephone Encounter (Signed)
Called and spoke with Sean Hubbard and his wife to review echocardiogram result.  Reviewed BNP which was normal showing no evidence of fluid overload.  Discussed echocardiogram showed normal pumping function LVEF 55-60%, grade 1 diastolic dysfunction, moderate to severe aortic stenosis.  As he is symptomatic with lightheadedness, dyspnea on exertion, fatigue we will plan for right and left heart cath.  Discussion regarding risks and benefits of cardiac catheterization. The patient understands that risks include but are not limited to stroke (1 in 1000), death (1 in 1000), kidney failure [usually temporary] (1 in 500), bleeding (1 in 200), allergic reaction [possibly serious] (1 in 200), and agrees to proceed.   I will update my recent office note 05/07/20 with the above.  He had BMET/CBC on 05/07/20 which were without acute findings.  He was agreeable to cardiac catheterization 05/15/20 or 05/16/20 with Dr. Mariah Milling. He prefers to have it done promptly.   Pending results of catheterization will likely require referral to structural heart team.   Alver Sorrow, NP

## 2020-05-09 NOTE — Telephone Encounter (Signed)
Called the [facility] cath lab. Left a message for them to call our office so that the patient can be scheduled.

## 2020-05-12 ENCOUNTER — Telehealth: Payer: Self-pay | Admitting: Family

## 2020-05-12 NOTE — Telephone Encounter (Signed)
Spoke with patients wife per release form and she was very upset that she did not get a call back regarding his procedure. Reviewed all instructions for procedure and instructed her to have him go tomorrow between 8A-1P at the [facility] drive through for his COVID swab and then go home until his procedure day. She verbalized understanding of all instructions and let her know that I would call her tomorrow once I confirm date and time has been scheduled with procedures. She verbalized understanding with no further questions at this time.      [facility] Cardiac Cath Instructions   You are scheduled for a Cardiac Cath on: Thursday July 1st  Please arrive at 08:30 am on the day of your procedure  Please expect a call from our [facility] to pre-register you  Do not eat/drink anything after midnight  Someone will need to drive you home  It is recommended someone be with you for the first 24 hours after your procedure  Wear clothes that are easy to get on/off and wear slip on shoes if possible   Medications bring a current list of all medications with you  _XX__ You may take all of your medications the morning of your procedure with enough water to swallow safely   Day of your procedure: [facility] entrance.  Free valet service is available.  After entering [facility] please check-in at the registration desk (1st desk on your right) to receive your armband. After receiving your armband someone will escort you to the cardiac cath/special procedures waiting area.  The usual length of stay after your procedure is about 2 to 3 hours.  This can vary.  If you have any questions, please call our office at [phone], or you may call the cardiac cath lab at [facility] directly at [phone]

## 2020-05-12 NOTE — Telephone Encounter (Signed)
Please call to discuss scheduling of heart catherization

## 2020-05-12 NOTE — Telephone Encounter (Signed)
Patients wife is calling back about cath being scheduled

## 2020-05-13 ENCOUNTER — Other Ambulatory Visit: Payer: Self-pay

## 2020-05-13 ENCOUNTER — Other Ambulatory Visit: Payer: 59

## 2020-05-13 ENCOUNTER — Other Ambulatory Visit: Payer: Self-pay | Admitting: Family

## 2020-05-13 ENCOUNTER — Other Ambulatory Visit
Admission: RE | Admit: 2020-05-13 | Discharge: 2020-05-13 | Disposition: A | Discharge status: Home / Self Care | Payer: Medicare Other | Source: Ambulatory Visit | Attending: Cardiovascular Disease | Admitting: Cardiovascular Disease

## 2020-05-13 DIAGNOSIS — I350 Nonrheumatic aortic (valve) stenosis: Secondary | ICD-10-CM

## 2020-05-13 LAB — SARS CORONAVIRUS 2 (TAT 6-24 HRS): SARS Coronavirus 2: NEGATIVE

## 2020-05-13 NOTE — Telephone Encounter (Signed)
A separate note was open for the same issue and was addressed by Elita Quick, RN for Dr. Mariah Milling on 6/28.  Will close this encounter.

## 2020-05-13 NOTE — Progress Notes (Signed)
Orders for cardiac cath scheduled for 05/15/20.   Alver Sorrow, NP

## 2020-05-13 NOTE — Telephone Encounter (Signed)
Orders placed.  Xaviar Lunn S Aydan Phoenix, NP  

## 2020-05-13 NOTE — Telephone Encounter (Signed)
Spoke with patient and confirmed procedure for Thursday with arrival at 08:30AM. He verbalized understanding and will also pass information on to his wife who had called previously. He was appreciative for the call back with no further questions at this time.

## 2020-05-14 ENCOUNTER — Ambulatory Visit: Payer: 59 | Admitting: Family

## 2020-05-14 DIAGNOSIS — I420 Dilated cardiomyopathy: Secondary | ICD-10-CM

## 2020-05-14 DIAGNOSIS — I200 Unstable angina: Secondary | ICD-10-CM

## 2020-05-15 ENCOUNTER — Other Ambulatory Visit: Payer: Self-pay

## 2020-05-15 ENCOUNTER — Encounter
Admission: RE | Disposition: A | Discharge status: Other Acute Inpatient Hospital | Payer: Self-pay | Source: Ambulatory Visit | Attending: Cardiovascular Disease

## 2020-05-15 ENCOUNTER — Inpatient Hospital Stay
Admission: RE | Admit: 2020-05-15 | Discharge: 2020-05-15 | DRG: 287 | Disposition: A | Discharge status: Other Acute Inpatient Hospital | Payer: Medicare Other | Attending: Cardiovascular Disease | Admitting: Cardiovascular Disease

## 2020-05-15 ENCOUNTER — Encounter: Payer: Self-pay | Admitting: Cardiovascular Disease

## 2020-05-15 ENCOUNTER — Inpatient Hospital Stay (HOSPITAL_COMMUNITY)
Admission: AD | Admit: 2020-05-15 | Discharge: 2020-05-29 | DRG: 220 | Disposition: A | Discharge status: Home Health | Payer: Medicare Other | Source: Other Acute Inpatient Hospital | Attending: Cardiothoracic Surgery | Admitting: Cardiothoracic Surgery

## 2020-05-15 ENCOUNTER — Inpatient Hospital Stay (HOSPITAL_COMMUNITY): Payer: Medicare Other

## 2020-05-15 ENCOUNTER — Encounter (HOSPITAL_COMMUNITY): Payer: Self-pay | Admitting: Cardiology

## 2020-05-15 DIAGNOSIS — D72829 Elevated white blood cell count, unspecified: Secondary | ICD-10-CM | POA: Diagnosis not present

## 2020-05-15 DIAGNOSIS — H8110 Benign paroxysmal vertigo, unspecified ear: Secondary | ICD-10-CM | POA: Diagnosis present

## 2020-05-15 DIAGNOSIS — Z7982 Long term (current) use of aspirin: Secondary | ICD-10-CM | POA: Diagnosis not present

## 2020-05-15 DIAGNOSIS — Z978 Presence of other specified devices: Secondary | ICD-10-CM

## 2020-05-15 DIAGNOSIS — R7989 Other specified abnormal findings of blood chemistry: Secondary | ICD-10-CM | POA: Diagnosis not present

## 2020-05-15 DIAGNOSIS — J90 Pleural effusion, not elsewhere classified: Secondary | ICD-10-CM

## 2020-05-15 DIAGNOSIS — T82855A Stenosis of coronary artery stent, initial encounter: Secondary | ICD-10-CM | POA: Diagnosis present

## 2020-05-15 DIAGNOSIS — I25110 Atherosclerotic heart disease of native coronary artery with unstable angina pectoris: Secondary | ICD-10-CM | POA: Diagnosis present

## 2020-05-15 DIAGNOSIS — Z79899 Other long term (current) drug therapy: Secondary | ICD-10-CM

## 2020-05-15 DIAGNOSIS — J942 Hemothorax: Secondary | ICD-10-CM | POA: Diagnosis not present

## 2020-05-15 DIAGNOSIS — K219 Gastro-esophageal reflux disease without esophagitis: Secondary | ICD-10-CM | POA: Diagnosis present

## 2020-05-15 DIAGNOSIS — I2510 Atherosclerotic heart disease of native coronary artery without angina pectoris: Secondary | ICD-10-CM

## 2020-05-15 DIAGNOSIS — D696 Thrombocytopenia, unspecified: Secondary | ICD-10-CM | POA: Diagnosis not present

## 2020-05-15 DIAGNOSIS — D62 Acute posthemorrhagic anemia: Secondary | ICD-10-CM | POA: Diagnosis not present

## 2020-05-15 DIAGNOSIS — M109 Gout, unspecified: Secondary | ICD-10-CM | POA: Diagnosis present

## 2020-05-15 DIAGNOSIS — Z955 Presence of coronary angioplasty implant and graft: Secondary | ICD-10-CM

## 2020-05-15 DIAGNOSIS — I440 Atrioventricular block, first degree: Secondary | ICD-10-CM | POA: Diagnosis present

## 2020-05-15 DIAGNOSIS — E872 Acidosis: Secondary | ICD-10-CM | POA: Diagnosis not present

## 2020-05-15 DIAGNOSIS — F17290 Nicotine dependence, other tobacco product, uncomplicated: Secondary | ICD-10-CM | POA: Diagnosis present

## 2020-05-15 DIAGNOSIS — I420 Dilated cardiomyopathy: Secondary | ICD-10-CM | POA: Diagnosis present

## 2020-05-15 DIAGNOSIS — Z938 Other artificial opening status: Secondary | ICD-10-CM

## 2020-05-15 DIAGNOSIS — Z952 Presence of prosthetic heart valve: Secondary | ICD-10-CM

## 2020-05-15 DIAGNOSIS — I110 Hypertensive heart disease with heart failure: Secondary | ICD-10-CM | POA: Diagnosis present

## 2020-05-15 DIAGNOSIS — J449 Chronic obstructive pulmonary disease, unspecified: Secondary | ICD-10-CM | POA: Diagnosis present

## 2020-05-15 DIAGNOSIS — Z09 Encounter for follow-up examination after completed treatment for conditions other than malignant neoplasm: Secondary | ICD-10-CM

## 2020-05-15 DIAGNOSIS — J432 Centrilobular emphysema: Secondary | ICD-10-CM

## 2020-05-15 DIAGNOSIS — E785 Hyperlipidemia, unspecified: Secondary | ICD-10-CM | POA: Diagnosis present

## 2020-05-15 DIAGNOSIS — I4891 Unspecified atrial fibrillation: Secondary | ICD-10-CM | POA: Diagnosis not present

## 2020-05-15 DIAGNOSIS — Y712 Prosthetic and other implants, materials and accessory cardiovascular devices associated with adverse incidents: Secondary | ICD-10-CM | POA: Diagnosis present

## 2020-05-15 DIAGNOSIS — Z951 Presence of aortocoronary bypass graft: Secondary | ICD-10-CM

## 2020-05-15 DIAGNOSIS — E8881 Metabolic syndrome: Secondary | ICD-10-CM | POA: Diagnosis present

## 2020-05-15 DIAGNOSIS — R7401 Elevation of levels of liver transaminase levels: Secondary | ICD-10-CM | POA: Diagnosis not present

## 2020-05-15 DIAGNOSIS — Z01810 Encounter for preprocedural cardiovascular examination: Secondary | ICD-10-CM

## 2020-05-15 DIAGNOSIS — I350 Nonrheumatic aortic (valve) stenosis: Secondary | ICD-10-CM | POA: Diagnosis present

## 2020-05-15 DIAGNOSIS — I10 Essential (primary) hypertension: Secondary | ICD-10-CM | POA: Diagnosis present

## 2020-05-15 DIAGNOSIS — I454 Nonspecific intraventricular block: Secondary | ICD-10-CM | POA: Diagnosis present

## 2020-05-15 DIAGNOSIS — Z20822 Contact with and (suspected) exposure to covid-19: Secondary | ICD-10-CM | POA: Diagnosis present

## 2020-05-15 DIAGNOSIS — I200 Unstable angina: Secondary | ICD-10-CM | POA: Diagnosis present

## 2020-05-15 DIAGNOSIS — I314 Cardiac tamponade: Secondary | ICD-10-CM | POA: Diagnosis not present

## 2020-05-15 DIAGNOSIS — Z01818 Encounter for other preprocedural examination: Secondary | ICD-10-CM

## 2020-05-15 DIAGNOSIS — Z9889 Other specified postprocedural states: Secondary | ICD-10-CM

## 2020-05-15 DIAGNOSIS — Z9689 Presence of other specified functional implants: Secondary | ICD-10-CM

## 2020-05-15 HISTORY — PX: LEFT HEART CATH AND CORONARY ANGIOGRAPHY: CATH118249

## 2020-05-15 HISTORY — DX: Nonrheumatic aortic (valve) stenosis: I35.0

## 2020-05-15 HISTORY — DX: Atherosclerotic heart disease of native coronary artery without angina pectoris: I25.10

## 2020-05-15 LAB — ABO/RH: ABO/RH(D): O POS

## 2020-05-15 SURGERY — LEFT HEART CATH AND CORONARY ANGIOGRAPHY
Anesthesia: Moderate Sedation

## 2020-05-15 MED ORDER — TRANEXAMIC ACID 1000 MG/10ML IV SOLN
1.5000 mg/kg/h | INTRAVENOUS | Status: AC
  Administered 2020-05-16: 1.5 mg/kg/h via INTRAVENOUS
  Filled 2020-05-15: qty 25

## 2020-05-15 MED ORDER — SODIUM CHLORIDE 0.9 % IV SOLN
1.5000 g | INTRAVENOUS | Status: DC
  Filled 2020-05-15: qty 1.5

## 2020-05-15 MED ORDER — LIDOCAINE HCL (PF) 1 % IJ SOLN
INTRAMUSCULAR | Status: DC | PRN
  Administered 2020-05-15: 15 mL

## 2020-05-15 MED ORDER — PHENYLEPHRINE HCL-NACL 20-0.9 MG/250ML-% IV SOLN
30.0000 ug/min | INTRAVENOUS | Status: DC
  Filled 2020-05-15: qty 250

## 2020-05-15 MED ORDER — SODIUM CHLORIDE 0.9 % WEIGHT BASED INFUSION
3.0000 mL/kg/h | INTRAVENOUS | Status: AC
  Administered 2020-05-15: 3 mL/kg/h via INTRAVENOUS

## 2020-05-15 MED ORDER — VITAMIN D3 25 MCG (1000 UNIT) PO TABS: 1000.0000 [IU] | ORAL_TABLET | Freq: Every day | ORAL | Status: DC

## 2020-05-15 MED ORDER — LABETALOL HCL 5 MG/ML IV SOLN
INTRAVENOUS | Status: DC | PRN
  Administered 2020-05-15: 10 mg via INTRAVENOUS

## 2020-05-15 MED ORDER — HEPARIN (PORCINE) IN NACL 1000-0.9 UT/500ML-% IV SOLN
INTRAVENOUS | Status: AC
  Filled 2020-05-15: qty 1000

## 2020-05-15 MED ORDER — VANCOMYCIN HCL 1500 MG/300ML IV SOLN
1500.0000 mg | INTRAVENOUS | Status: AC
  Administered 2020-05-16: 1500 mg via INTRAVENOUS
  Filled 2020-05-15: qty 300

## 2020-05-15 MED ORDER — TRANEXAMIC ACID (OHS) BOLUS VIA INFUSION
15.0000 mg/kg | INTRAVENOUS | Status: AC
  Administered 2020-05-16: 1660.5 mg via INTRAVENOUS
  Filled 2020-05-15: qty 1661

## 2020-05-15 MED ORDER — SODIUM CHLORIDE 0.9 % WEIGHT BASED INFUSION
1.0000 mL/kg/h | INTRAVENOUS | Status: DC
  Administered 2020-05-15: 1 mL/kg/h via INTRAVENOUS

## 2020-05-15 MED ORDER — NITROGLYCERIN IN D5W 200-5 MCG/ML-% IV SOLN: 2.0000 ug/min | INTRAVENOUS | Status: DC

## 2020-05-15 MED ORDER — SODIUM CHLORIDE 0.9% FLUSH: 3.0000 mL | Freq: Two times a day (BID) | INTRAVENOUS | Status: DC

## 2020-05-15 MED ORDER — INSULIN REGULAR(HUMAN) IN NACL 100-0.9 UT/100ML-% IV SOLN
INTRAVENOUS | Status: DC
  Filled 2020-05-15: qty 100

## 2020-05-15 MED ORDER — POTASSIUM CHLORIDE 2 MEQ/ML IV SOLN
80.0000 meq | INTRAVENOUS | Status: DC
  Filled 2020-05-15: qty 40

## 2020-05-15 MED ORDER — TRANEXAMIC ACID 1000 MG/10ML IV SOLN
1.5000 mg/kg/h | INTRAVENOUS | Status: DC
  Filled 2020-05-15: qty 25

## 2020-05-15 MED ORDER — PLASMA-LYTE 148 IV SOLN
INTRAVENOUS | Status: DC
  Filled 2020-05-15: qty 2.5

## 2020-05-15 MED ORDER — SODIUM CHLORIDE 0.9 % IV SOLN
250.0000 mL | INTRAVENOUS | Status: DC | PRN
  Administered 2020-05-15: 250 mL via INTRAVENOUS

## 2020-05-15 MED ORDER — MIDAZOLAM HCL 2 MG/2ML IJ SOLN
INTRAMUSCULAR | Status: AC
  Filled 2020-05-15: qty 2

## 2020-05-15 MED ORDER — IOHEXOL 300 MG/ML  SOLN
INTRAMUSCULAR | Status: DC | PRN
  Administered 2020-05-15: 150 mL

## 2020-05-15 MED ORDER — CO Q 10 100 MG PO CAPS: 100.0000 mg | ORAL_CAPSULE | Freq: Every day | ORAL | Status: DC

## 2020-05-15 MED ORDER — ACETAMINOPHEN 325 MG PO TABS: 650.0000 mg | ORAL_TABLET | ORAL | Status: DC | PRN

## 2020-05-15 MED ORDER — SODIUM CHLORIDE 0.9% FLUSH: 3.0000 mL | INTRAVENOUS | Status: DC | PRN

## 2020-05-15 MED ORDER — SODIUM CHLORIDE 0.9 % IV SOLN
750.0000 mg | INTRAVENOUS | Status: DC
  Filled 2020-05-15: qty 750

## 2020-05-15 MED ORDER — DEXMEDETOMIDINE HCL IN NACL 400 MCG/100ML IV SOLN
0.1000 ug/kg/h | INTRAVENOUS | Status: DC
  Filled 2020-05-15: qty 100

## 2020-05-15 MED ORDER — SODIUM CHLORIDE 0.9 % IV SOLN
750.0000 mg | INTRAVENOUS | Status: AC
  Administered 2020-05-16: 750 mg via INTRAVENOUS
  Filled 2020-05-15: qty 750

## 2020-05-15 MED ORDER — SODIUM CHLORIDE 0.9 % IV SOLN: 250.0000 mL | INTRAVENOUS | Status: DC | PRN

## 2020-05-15 MED ORDER — CHLORHEXIDINE GLUCONATE CLOTH 2 % EX PADS
6.0000 | MEDICATED_PAD | Freq: Once | CUTANEOUS | Status: AC
  Administered 2020-05-15: 6 via TOPICAL

## 2020-05-15 MED ORDER — ONDANSETRON HCL 4 MG/2ML IJ SOLN: 4.0000 mg | Freq: Four times a day (QID) | INTRAMUSCULAR | Status: DC | PRN

## 2020-05-15 MED ORDER — LIDOCAINE HCL (PF) 1 % IJ SOLN
INTRAMUSCULAR | Status: AC
  Filled 2020-05-15: qty 30

## 2020-05-15 MED ORDER — EPINEPHRINE HCL 5 MG/250ML IV SOLN IN NS
0.0000 ug/min | INTRAVENOUS | Status: AC
  Administered 2020-05-16: 2 ug/min via INTRAVENOUS
  Filled 2020-05-15: qty 250

## 2020-05-15 MED ORDER — MAGNESIUM SULFATE 50 % IJ SOLN
40.0000 meq | INTRAMUSCULAR | Status: DC
  Filled 2020-05-15: qty 9.85

## 2020-05-15 MED ORDER — SODIUM CHLORIDE 0.9 % IV SOLN
INTRAVENOUS | Status: DC
  Filled 2020-05-15: qty 30

## 2020-05-15 MED ORDER — EZETIMIBE 10 MG PO TABS: 10.0000 mg | ORAL_TABLET | Freq: Every day | ORAL | Status: DC

## 2020-05-15 MED ORDER — ALLOPURINOL 100 MG PO TABS: 100.0000 mg | ORAL_TABLET | Freq: Every day | ORAL | Status: DC

## 2020-05-15 MED ORDER — NOREPINEPHRINE 4 MG/250ML-% IV SOLN
0.0000 ug/min | INTRAVENOUS | Status: DC
  Filled 2020-05-15: qty 250

## 2020-05-15 MED ORDER — MIDAZOLAM HCL 2 MG/2ML IJ SOLN
INTRAMUSCULAR | Status: DC | PRN
  Administered 2020-05-15 (×2): 1 mg via INTRAVENOUS

## 2020-05-15 MED ORDER — FENTANYL CITRATE (PF) 100 MCG/2ML IJ SOLN
INTRAMUSCULAR | Status: DC | PRN
  Administered 2020-05-15 (×2): 25 ug via INTRAVENOUS

## 2020-05-15 MED ORDER — TRIAMCINOLONE ACETONIDE 55 MCG/ACT NA AERO: 1.0000 | INHALATION_SPRAY | Freq: Every day | NASAL | Status: DC

## 2020-05-15 MED ORDER — TRANEXAMIC ACID (OHS) BOLUS VIA INFUSION
15.0000 mg/kg | INTRAVENOUS | Status: DC
  Filled 2020-05-15: qty 1674

## 2020-05-15 MED ORDER — INSULIN REGULAR(HUMAN) IN NACL 100-0.9 UT/100ML-% IV SOLN
INTRAVENOUS | Status: AC
  Administered 2020-05-16: 1.2 [IU]/h via INTRAVENOUS

## 2020-05-15 MED ORDER — TRANEXAMIC ACID (OHS) PUMP PRIME SOLUTION
2.0000 mg/kg | INTRAVENOUS | Status: DC
  Filled 2020-05-15: qty 2.23

## 2020-05-15 MED ORDER — ASPIRIN 81 MG PO CHEW
CHEWABLE_TABLET | ORAL | Status: AC
  Administered 2020-05-15: 81 mg via ORAL
  Filled 2020-05-15: qty 1

## 2020-05-15 MED ORDER — DEXMEDETOMIDINE HCL IN NACL 400 MCG/100ML IV SOLN
0.1000 ug/kg/h | INTRAVENOUS | Status: AC
  Administered 2020-05-16: .7 ug/kg/h via INTRAVENOUS
  Filled 2020-05-15: qty 100

## 2020-05-15 MED ORDER — LABETALOL HCL 5 MG/ML IV SOLN
INTRAVENOUS | Status: AC
  Filled 2020-05-15: qty 4

## 2020-05-15 MED ORDER — MILRINONE LACTATE IN DEXTROSE 20-5 MG/100ML-% IV SOLN
0.3000 ug/kg/min | INTRAVENOUS | Status: AC
  Administered 2020-05-16: .25 ug/kg/min via INTRAVENOUS

## 2020-05-15 MED ORDER — METOPROLOL SUCCINATE ER 50 MG PO TB24: 50.0000 mg | ORAL_TABLET | Freq: Every day | ORAL | Status: DC

## 2020-05-15 MED ORDER — MILRINONE LACTATE IN DEXTROSE 20-5 MG/100ML-% IV SOLN
0.3000 ug/kg/min | INTRAVENOUS | Status: DC
  Filled 2020-05-15: qty 100

## 2020-05-15 MED ORDER — PANTOPRAZOLE SODIUM 40 MG PO TBEC: 40.0000 mg | DELAYED_RELEASE_TABLET | Freq: Every day | ORAL | Status: DC

## 2020-05-15 MED ORDER — ASPIRIN EC 81 MG PO TBEC: 81.0000 mg | DELAYED_RELEASE_TABLET | Freq: Every day | ORAL | Status: DC

## 2020-05-15 MED ORDER — MULTI-VITAMIN/MINERALS PO TABS: 1.0000 | ORAL_TABLET | Freq: Every day | ORAL | Status: DC

## 2020-05-15 MED ORDER — ASCORBIC ACID 500 MG PO TABS: 1000.0000 mg | ORAL_TABLET | Freq: Every day | ORAL | Status: DC

## 2020-05-15 MED ORDER — NITROGLYCERIN IN D5W 200-5 MCG/ML-% IV SOLN
2.0000 ug/min | INTRAVENOUS | Status: DC
  Filled 2020-05-15: qty 250

## 2020-05-15 MED ORDER — HYDRALAZINE HCL 20 MG/ML IJ SOLN: 10.0000 mg | INTRAMUSCULAR | Status: DC | PRN

## 2020-05-15 MED ORDER — VANCOMYCIN HCL 1500 MG/300ML IV SOLN
1500.0000 mg | INTRAVENOUS | Status: DC
  Filled 2020-05-15: qty 300

## 2020-05-15 MED ORDER — SODIUM CHLORIDE 0.9 % IV SOLN
1.5000 g | INTRAVENOUS | Status: AC
  Administered 2020-05-16: 1.5 g via INTRAVENOUS
  Filled 2020-05-15: qty 1.5

## 2020-05-15 MED ORDER — BISACODYL 5 MG PO TBEC
5.0000 mg | DELAYED_RELEASE_TABLET | Freq: Once | ORAL | Status: AC
  Administered 2020-05-15: 5 mg via ORAL
  Filled 2020-05-15: qty 1

## 2020-05-15 MED ORDER — METOPROLOL TARTRATE 12.5 MG HALF TABLET
12.5000 mg | ORAL_TABLET | Freq: Once | ORAL | Status: AC
  Administered 2020-05-16: 12.5 mg via ORAL
  Filled 2020-05-15: qty 1

## 2020-05-15 MED ORDER — NITROGLYCERIN 0.4 MG SL SUBL: 0.4000 mg | SUBLINGUAL_TABLET | SUBLINGUAL | Status: DC | PRN

## 2020-05-15 MED ORDER — ATORVASTATIN CALCIUM 20 MG PO TABS: 40.0000 mg | ORAL_TABLET | Freq: Every day | ORAL | Status: DC

## 2020-05-15 MED ORDER — LABETALOL HCL 5 MG/ML IV SOLN: 10.0000 mg | INTRAVENOUS | Status: DC | PRN

## 2020-05-15 MED ORDER — TEMAZEPAM 15 MG PO CAPS
15.0000 mg | ORAL_CAPSULE | Freq: Once | ORAL | Status: AC | PRN
  Administered 2020-05-15: 15 mg via ORAL
  Filled 2020-05-15: qty 1

## 2020-05-15 MED ORDER — MUPIROCIN 2 % EX OINT
1.0000 "application " | TOPICAL_OINTMENT | Freq: Two times a day (BID) | CUTANEOUS | Status: DC
  Filled 2020-05-15: qty 22

## 2020-05-15 MED ORDER — TRANEXAMIC ACID (OHS) PUMP PRIME SOLUTION
2.0000 mg/kg | INTRAVENOUS | Status: DC
  Filled 2020-05-15: qty 2.21

## 2020-05-15 MED ORDER — FENTANYL CITRATE (PF) 100 MCG/2ML IJ SOLN
INTRAMUSCULAR | Status: AC
  Filled 2020-05-15: qty 2

## 2020-05-15 MED ORDER — CHLORHEXIDINE GLUCONATE 0.12 % MT SOLN
15.0000 mL | Freq: Once | OROMUCOSAL | Status: AC
  Administered 2020-05-16: 15 mL via OROMUCOSAL
  Filled 2020-05-15: qty 15

## 2020-05-15 MED ORDER — ASPIRIN 81 MG PO CHEW: 81.0000 mg | CHEWABLE_TABLET | ORAL | Status: AC

## 2020-05-15 MED ORDER — HEPARIN (PORCINE) IN NACL 1000-0.9 UT/500ML-% IV SOLN
INTRAVENOUS | Status: DC | PRN
  Administered 2020-05-15: 500 mL

## 2020-05-15 MED ORDER — EPINEPHRINE HCL 5 MG/250ML IV SOLN IN NS
0.0000 ug/min | INTRAVENOUS | Status: DC
  Filled 2020-05-15: qty 250

## 2020-05-15 SURGICAL SUPPLY — 15 items
CANNULA 5F STIFF (CANNULA) ×4 IMPLANT
CATH INFINITI 5FR AL1 (CATHETERS) ×4 IMPLANT
CATH INFINITI 5FR ANG PIGTAIL (CATHETERS) ×4 IMPLANT
CATH INFINITI 5FR JL4 (CATHETERS) ×4 IMPLANT
CATH INFINITI 5FR JL5 (CATHETERS) ×4 IMPLANT
CATH INFINITI JR4 5F (CATHETERS) ×4 IMPLANT
DEVICE CLOSURE MYNXGRIP 5F (Vascular Products) ×4 IMPLANT
KIT MANI 3VAL PERCEP (MISCELLANEOUS) ×4 IMPLANT
NEEDLE PERC 18GX7CM (NEEDLE) ×4 IMPLANT
PACK CARDIAC CATH (CUSTOM PROCEDURE TRAY) ×4 IMPLANT
SHEATH AVANTI 5FR X 11CM (SHEATH) ×4 IMPLANT
SHEATH AVANTI 7FRX11 (SHEATH) ×4 IMPLANT
WIRE EMERALD ST .035X150CM (WIRE) ×4 IMPLANT
WIRE GUIDERIGHT .035X150 (WIRE) ×4 IMPLANT
WIRE NITINOL .018 (WIRE) ×4 IMPLANT

## 2020-05-15 NOTE — Consult Note (Signed)
[address]       [place] [id number]             [phone]        Eulis ManlyJoe A Ganser [facility] Medical Record #[id number] Date of Birth: December 28, 1940  Referring: No ref. provider found Primary Care: Lynnea FerrierKlein, Bert J III, MD Primary Cardiologist:Timothy Mariah MillingGollan, MD  Chief Complaint:   No chief complaint on file. light-headedness  History of Present Illness:     We are kindly asked to see this 79 year old gentleman who presented with lightheadedness.  He has a history of coronary artery disease status post PCI of the RCA over 20 years ago.  He is a retired Insurance underwriterinsurance adjuster.  He has been in his usual state of health until recently when began to notice dyspnea on exertion and lightheadedness.  Examination demonstrated a heart murmur.  This was followed up with echocardiogram demonstrating moderate to severe aortic stenosis.  He underwent left heart catheterization today showing multivessel coronary artery disease and was referred for aortic valve replacement and coronary artery bypass grafting.  Presently, he is hemodynamically stable and symptom free.  He denies any edema in his lower extremities.  He denies orthopnea or PND.   Current Activity/ Functional Status: Patient will be independent with mobility/ambulation, transfers, ADL's, IADL's.   Zubrod Score: At the time of surgery this patient's most appropriate activity status/level should be described as: []     0    Normal activity, no symptoms []     1    Restricted in physical strenuous activity but ambulatory, able to do out light work []     2    Ambulatory and capable of self care, unable to do work activities, up and about                 more than 50%  Of the time                            []     3    Only limited self care, in bed greater than 50% of waking hours []     4    Completely disabled, no self care, confined to bed or chair []     5    Moribund  Past Medical History:  Diagnosis Date  . Aortic stenosis     a. TTE 04/2020: EF 55 to 60%, Gr1DD, nl RVSF & cavity size, mod dilated LA, mildly dilated RA, trivial mitral regurg, mod-sev aoritc stenosis w/ mean gradient of 37mmHg and VTI 1.01 cm  . CAD (coronary artery disease)    a. PCI x 4 to RCA in 1997 in [place]; b. LHC 05/15/2020: pLAD 95%, mLCx 50-60%, dominant RCA w/ 4 stents from the ostial region extending distally with sequential severe stenosis in the proximal RCA estimated at 95% followed by 90% stenosis, dRCA 80% prior to the bifurcation of RCA branches  . Gout   . Hyperlipemia   . Hypertension     Past Surgical History:  Procedure Laterality Date  . CARDIAC CATHETERIZATION    . CORONARY ANGIOPLASTY WITH STENT PLACEMENT  07/18/1996   x 4 to RCA in [place] Dr. Edilia Boickson  . LEFT HEART CATH AND CORONARY ANGIOGRAPHY N/A 05/15/2020   Procedure: LEFT HEART CATH AND CORONARY ANGIOGRAPHY;  Surgeon: Antonieta IbaGollan, Timothy J, MD;  Location: [facility] INVASIVE CV LAB;  Service: Cardiovascular;  Laterality: N/A;  . ROTATOR CUFF REPAIR Left  Social History   Tobacco Use  Smoking Status Current Some Day Smoker  . Types: Cigars  Smokeless Tobacco Never Used    Social History   Substance and Sexual Activity  Alcohol Use Yes  . Alcohol/week: 1.0 standard drink  . Types: 1 Shots of liquor per week     No Known Allergies  Current Facility-Administered Medications  Medication Dose Route Frequency Provider Last Rate Last Admin  . [START ON 05/16/2020] cefUROXime (ZINACEF) 1.5 g in sodium chloride 0.9 % 100 mL IVPB  1.5 g Intravenous To OR Pyper Olexa, Merri Brunette, MD      . Melene Muller ON 05/16/2020] cefUROXime (ZINACEF) 750 mg in sodium chloride 0.9 % 100 mL IVPB  750 mg Intravenous To OR Aarav Burgett, Merri Brunette, MD      . Melene Muller ON 05/16/2020] dexmedetomidine (PRECEDEX) 400 MCG/100ML (4 mcg/mL) infusion  0.1-0.7 mcg/kg/hr Intravenous To OR Linden Dolin, MD      . Melene Muller ON 05/16/2020] EPINEPHrine (ADRENALIN) 4 mg in NS 250 mL (0.016 mg/mL) premix infusion  0-10 mcg/min  Intravenous To OR Shawnte Demarest, Merri Brunette, MD      . Melene Muller ON 05/16/2020] heparin 30,000 units/NS 1000 mL solution for CELLSAVER   Other To OR Linden Dolin, MD      . Melene Muller ON 05/16/2020] heparin sodium (porcine) 2,500 Units, papaverine 30 mg in electrolyte-148 (PLASMALYTE-148) 500 mL irrigation   Irrigation To OR Maranatha Grossi, Merri Brunette, MD      . Melene Muller ON 05/16/2020] insulin regular, human (MYXREDLIN) 100 units/ 100 mL infusion   Intravenous To OR Linden Dolin, MD      . Melene Muller ON 05/16/2020] magnesium sulfate (IV Push/IM) injection 40 mEq  40 mEq Other To OR Kionna Brier, Merri Brunette, MD      . Melene Muller ON 05/16/2020] milrinone (PRIMACOR) 20 MG/100 ML (0.2 mg/mL) infusion  0.3 mcg/kg/min Intravenous To OR Dejion Grillo, Merri Brunette, MD      . Melene Muller ON 05/16/2020] nitroGLYCERIN 50 mg in dextrose 5 % 250 mL (0.2 mg/mL) infusion  2-200 mcg/min Intravenous To OR Palmina Clodfelter, Merri Brunette, MD      . Melene Muller ON 05/16/2020] norepinephrine (LEVOPHED) 4mg  in premix infusion  0-40 mcg/min Intravenous To OR , MD      . Linden Dolin ON 05/16/2020] phenylephrine (NEOSYNEPHRINE) 20-0.9 MG/250ML-% infusion  30-200 mcg/min Intravenous To OR 07/17/2020, MD      . Linden Dolin ON 05/16/2020] potassium chloride injection 80 mEq  80 mEq Other To OR Abrey Bradway, 07/17/2020, MD      . Merri Brunette ON 05/16/2020] tranexamic acid (CYKLOKAPRON) 2,500 mg in sodium chloride 0.9 % 250 mL (10 mg/mL) infusion  1.5 mg/kg/hr Intravenous To OR Lexus Shampine, 07/17/2020, MD      . Merri Brunette ON 05/16/2020] tranexamic acid (CYKLOKAPRON) bolus via infusion - over 30 minutes 1,660.5 mg  15 mg/kg Intravenous To OR Liem Copenhaver, 07/17/2020, MD      . Merri Brunette ON 05/16/2020] tranexamic acid (CYKLOKAPRON) pump prime solution 221 mg  2 mg/kg Intracatheter To OR Lyana Asbill, 07/17/2020, MD      . Merri Brunette ON 05/16/2020] vancomycin (VANCOREADY) IVPB 1500 mg/300 mL  1,500 mg Intravenous To OR Jairon Ripberger, 07/17/2020, MD        Medications Prior to Admission  Medication Sig Dispense Refill Last Dose  . allopurinol  (ZYLOPRIM) 100 MG tablet Take 100 mg by mouth daily.     . Ascorbic Acid (VITAMIN C) 1000 MG tablet Take 1,000 mg by mouth daily.     Merri Brunette  aspirin EC 81 MG tablet Take 81 mg by mouth daily. Swallow whole.     Marland Kitchen atorvastatin (LIPITOR) 40 MG tablet Take 40 mg by mouth daily at 6 PM.      . cholecalciferol (VITAMIN D) 25 MCG (1000 UNIT) tablet Take 1,000 Units by mouth daily.     . Coenzyme Q10 (CO Q 10) 100 MG CAPS Take 100 mg by mouth daily.     Marland Kitchen ezetimibe (ZETIA) 10 MG tablet Take 1 tablet (10 mg total) by mouth daily. Please call to schedule appointment for further refills. Thank you! (Patient taking differently: Take 10 mg by mouth daily at 6 PM. ) 30 tablet 0   . KRILL OIL PO Take 750 mg by mouth daily.     . metoprolol succinate (TOPROL-XL) 50 MG 24 hr tablet Take 50 mg by mouth daily at 6 PM. Take with or immediately following a meal.      . Multiple Vitamins-Minerals (MULTIVITAMIN WITH MINERALS) tablet Take 1 tablet by mouth daily.     Marland Kitchen omeprazole (PRILOSEC) 20 MG capsule Take 20 mg by mouth daily.      Marland Kitchen OVER THE COUNTER MEDICATION Apply 1 application topically 2 (two) times daily. Vidacann  Arthritis cream     . Triamcinolone Acetonide (NASACORT ALLERGY 24HR NA) Place 1 spray into the nose daily.     . Turmeric 500 MG TABS Take 1,000 mg by mouth daily.       Family History  Problem Relation Age of Onset  . Cancer Mother   . Cancer Father      Review of Systems:   ROS A comprehensive review of systems was negative.     Cardiac Review of Systems: Y or  [    ]= no  Chest Pain [    ]  Resting SOB [   ] Exertional SOB  [  ]  Orthopnea [  ]   Pedal Edema [   ]    Palpitations [  ] Syncope  [  ]   Presyncope [   ]  General Review of Systems: [Y] = yes [  ]=no Constitional: recent weight change [  ]; anorexia [  ]; fatigue [  ]; nausea [  ]; night sweats [  ]; fever [  ]; or chills [  ]                                                               Dental: Last Dentist visit:   Eye :  blurred vision [  ]; diplopia [   ]; vision changes [  ];  Amaurosis fugax[  ]; Resp: cough [  ];  wheezing[  ];  hemoptysis[  ]; shortness of breath[  ]; paroxysmal nocturnal dyspnea[  ]; dyspnea on exertion[  ]; or orthopnea[  ];  GI:  gallstones[  ], vomiting[  ];  dysphagia[  ]; melena[  ];  hematochezia [  ]; heartburn[  ];   Hx of  Colonoscopy[  ]; GU: kidney stones [  ]; hematuria[  ];   dysuria [  ];  nocturia[  ];  history of     obstruction [  ]; urinary frequency [  ]  Skin: rash, swelling[  ];, hair loss[  ];  peripheral edema[  ];  or itching[  ]; Musculosketetal: myalgias[  ];  joint swelling[  ];  joint erythema[  ];  joint pain[  ];  back pain[  ];  Heme/Lymph: bruising[  ];  bleeding[  ];  anemia[  ];  Neuro: TIA[  ];  headaches[  ];  stroke[  ];  vertigo[  ];  seizures[  ];   paresthesias[  ];  difficulty walking[  ];  Psych:depression[  ]; anxiety[  ];  Endocrine: diabetes[  ];  thyroid dysfunction[  ];               Physical Exam: BP (!) 150/115 (BP Location: Left Arm)   Pulse 82   Temp 98 F (36.7 C) (Oral)   Resp 16    General appearance: alert and cooperative Neck: no adenopathy, no carotid bruit, no JVD, supple, symmetrical, trachea midline and thyroid not enlarged, symmetric, no tenderness/mass/nodules Resp: clear to auscultation bilaterally Cardio: regular rate and rhythm, S1, S2 normal, no murmur, click, rub or gallop GI: soft, non-tender; bowel sounds normal; no masses,  no organomegaly Extremities: extremities normal, atraumatic, no cyanosis or edema Neurologic: Alert and oriented X 3, normal strength and tone. Normal symmetric reflexes. Normal coordination and gait  Diagnostic Studies & Laboratory data:     Recent Radiology Findings:   CARDIAC CATHETERIZATION  Result Date: 05/15/2020  Prox LAD lesion is 95% stenosed.  Prox Cx to Mid Cx lesion is 60% stenosed.  Ost RCA to Prox RCA lesion is 95% stenosed.  Prox RCA lesion is 90% stenosed.   Dist RCA lesion is 80% stenosed.      I have independently reviewed the above radiologic studies and discussed with the patient   Recent Lab Findings: Lab Results  Component Value Date   WBC 5.7 05/07/2020   HGB 17.1 05/07/2020   HCT 51.7 (H) 05/07/2020   PLT 183 05/07/2020   GLUCOSE 112 (H) 05/07/2020   NA 142 05/07/2020   K 4.8 05/07/2020   CL 101 05/07/2020   CREATININE 0.91 05/07/2020   BUN 15 05/07/2020   CO2 19 (L) 05/07/2020      Assessment / Plan:     79 yo man found to have severe AS and multivessel CAD; referred for AVR/CABG. Agree that this is the best therapeuric option, and he is a good candidate for the procedure. Plan operation 05/16/20.      I  spent 30 minutes counseling the patient face to face.   Rangel Echeverri Z. Vickey Sages, MD [phone] 05/15/2020 4:51 PM

## 2020-05-15 NOTE — Discharge Summary (Signed)
Discharge Summary    Patient ID: Sean Hubbard  MRN: [id number], DOB/AGE: 21-Mar-1941 79 y.o.  Admit Date: 05/15/2020 Discharge Date: 05/15/2020  Primary Care Provider: Lynnea Ferrier, MD Primary Cardiologist: Dr. Mariah Milling, MD  Discharge Diagnoses    Active Problems:   Unstable angina (HCC)   Dilated cardiomyopathy (HCC)   Severe aortic stenosis   CAD (coronary artery disease)   Essential hypertension   Hyperlipidemia LDL goal <70   Allergies No Known Allergies   History of Present Illness     CAD status post post remote stenting x4 to the RCA in [place] in 1997, moderate to severe aortic stenosis, HTN, HLD, COPD, BPPV, and tobacco use who was recently seen in the office on 6/23 with lightheadedness and dyspnea and presented to [facility] on 7/1 for outpatient diagnostic LHC.  Hospital Course     Consultants: None   Sean Hubbard has a history of remote stenting x4 to the RCA [place] 1997.  He was previously followed by [facility] and underwent myocardial perfusion imaging in 2015 which showed a small perfusion defect of moderate intensity in the inferior region of the stress images.  He has subsequently established with Dr. Mariah Milling in 01/2019 with recommendation to follow-up with echo for new murmur though this was not completed.  More recently, he was seen by Gillian Shields, NP on 05/07/2020 with a 1 month history of constant lightheadedness that felt different than his known BPPV.  He also noted increased fatigue and dyspnea on exertion and tightness in his arms.  Echo on 05/08/2020 showed an EF of 55 to 60%, grade 1 diastolic dysfunction, normal RV systolic function and RV cavity size, moderately dilated left atrium, mildly dilated right atrium, trivial mitral regurgitation, moderate to severe aortic stenosis with a mean gradient of 37 mmHg and VTI of 1.01 cm.  In this setting he underwent outpatient diagnostic cath on 05/15/2020 which demonstrated proximal LAD 95% stenosis, mid LCx 50 to 60%  stenosis, dominant RCA with 4 stents from the ostial region extending distally with sequential severe stenosis in the proximal region estimated at 95% followed by 90% stenosis, distal RCA 80% stenosis prior to the bifurcation of RCA branches, LVEF 55% with severe aortic valve stenosis with a gradient estimated at 40 mmHg plus possibly higher though difficult to determine in the setting of frequent PVCs in a pattern of ventricular bigeminy.  Given the patient's severity of his coronary disease, underlying aortic valve stenosis, and worsening symptoms of unstable angina over the past several weeks it was recommended the patient be directly transferred from [facility] to [facility] for consideration of CABG and AVR.  Patient will be transported to [facility] via [facility]. Sign out has been given to our [facility] team.   The patient's right femoral cath site has been examined is healing well without issues at this time. The patient has been seen by Dr. Mariah Milling and felt to be stable for transfer to [facility] today. All follow up appointments have been made. Discharge medications are listed below. Prescriptions have been reviewed and transferred to [facility].  _____________  Discharge Vitals Blood pressure (!) 139/93, pulse 78, temperature 98.4 F (36.9 C), temperature source Oral, resp. rate 17, height 5' 8.5" (1.74 m), weight 110.7 kg, SpO2 99 %.  Filed Weights   05/15/20 0919  Weight: 110.7 kg    Labs & Radiologic Studies    CBC No results for input(s): WBC, NEUTROABS, HGB, HCT, MCV, PLT  in the last 72 hours. Basic Metabolic Panel No results for input(s): NA, K, CL, CO2, GLUCOSE, BUN, CREATININE, CALCIUM, MG, PHOS in the last 72 hours. Liver Function Tests No results for input(s): AST, ALT, ALKPHOS, BILITOT, PROT, ALBUMIN in the last 72 hours. No results for input(s): LIPASE, AMYLASE in the last 72 hours. Cardiac Enzymes No results for input(s): CKTOTAL, CKMB, CKMBINDEX, TROPONINI in the last 72  hours. BNP Invalid input(s): POCBNP D-Dimer No results for input(s): DDIMER in the last 72 hours. Hemoglobin A1C No results for input(s): HGBA1C in the last 72 hours. Fasting Lipid Panel No results for input(s): CHOL, HDL, LDLCALC, TRIG, CHOLHDL, LDLDIRECT in the last 72 hours. Thyroid Function Tests No results for input(s): TSH, T4TOTAL, T3FREE, THYROIDAB in the last 72 hours.  Invalid input(s): FREET3 _____________   Diagnostic Studies/Procedures   2D echo 05/08/2020: 1. Left ventricular ejection fraction, by estimation, is 55 to 60%. The  left ventricle has normal function. Left ventricular endocardial border  not optimally defined to evaluate regional wall motion. Left ventricular  diastolic parameters are consistent  with Grade I diastolic dysfunction (impaired relaxation).  2. Right ventricular systolic function is normal. The right ventricular  size is normal. Tricuspid regurgitation signal is inadequate for assessing  PA pressure.  3. Left atrial size was moderately dilated.  4. Right atrial size was mildly dilated.  5. The mitral valve is normal in structure. Trivial mitral valve  regurgitation. No evidence of mitral stenosis.  6. The aortic valve is abnormal. Aortic valve regurgitation is not  visualized. Moderate to severe aortic valve stenosis. Aortic valve area,  by VTI measures 1.01 cm. Aortic valve mean gradient measures 37.0 mmHg. __________  LHC 05/15/2020: Diagnostic Dominance: Right Left Anterior Descending  Prox LAD lesion is 95% stenosed.  Left Circumflex  Prox Cx to Mid Cx lesion is 60% stenosed.  Right Coronary Artery  Ost RCA to Prox RCA lesion is 95% stenosed. The lesion was previously treated.  Prox RCA lesion is 90% stenosed.  Dist RCA lesion is 80% stenosed.  Intervention  No interventions have been documented. Wall Motion       Resting               Coronary Diagrams  Diagnostic Dominance:  Right    _____________  Disposition   Pt is being discharged home today in good condition.  Follow-up Plans & Appointments    Pending [facility] hospitalization  Discharge Instructions    Diet - low sodium heart healthy   Complete by: As directed    Increase activity slowly   Complete by: As directed       Discharge Medications   Allergies as of 05/15/2020   No Known Allergies     Medication List    TAKE these medications   allopurinol 100 MG tablet Commonly known as: ZYLOPRIM Take 100 mg by mouth daily.   aspirin EC 81 MG tablet Take 81 mg by mouth daily. Swallow whole.   atorvastatin 40 MG tablet Commonly known as: LIPITOR Take 40 mg by mouth daily at 6 PM.   cholecalciferol 25 MCG (1000 UNIT) tablet Commonly known as: VITAMIN D Take 1,000 Units by mouth daily.   Co Q 10 100 MG Caps Take 100 mg by mouth daily.   ezetimibe 10 MG tablet Commonly known as: ZETIA Take 1 tablet (10 mg total) by mouth daily. Please call to schedule appointment for further refills. Thank you! What changed:   when to take this  additional instructions   KRILL OIL PO Take 750 mg by mouth daily.   metoprolol succinate 50 MG 24 hr tablet Commonly known as: TOPROL-XL Take 50 mg by mouth daily at 6 PM. Take with or immediately following a meal.   multivitamin with minerals tablet Take 1 tablet by mouth daily.   NASACORT ALLERGY 24HR NA Place 1 spray into the nose daily.   omeprazole 20 MG capsule Commonly known as: PRILOSEC Take 20 mg by mouth daily.   OVER THE COUNTER MEDICATION Apply 1 application topically 2 (two) times daily. Vidacann  Arthritis cream   Turmeric 500 MG Tabs Take 1,000 mg by mouth daily.   vitamin C 1000 MG tablet Take 1,000 mg by mouth daily.        Aspirin prescribed at discharge?  Yes High Intensity Statin Prescribed? (Lipitor 40-80mg  or Crestor 20-40mg ): Yes Beta Blocker Prescribed? Yes For EF <40%, was ACEI/ARB Prescribed? No: EF  > 40% ADP Receptor Inhibitor Prescribed? (i.e. Plavix etc.-Includes Medically Managed Patients): No: patient needs CABG For EF <40%, Aldosterone Inhibitor Prescribed? No: EF > 40% Was EF assessed during THIS hospitalization? Yes Was Cardiac Rehab II ordered? (Included Medically managed Patients): Yes, will be ordered at time of discharge from [facility] in [place] given patient is only being transferred at this time   Outstanding Labs/Studies   None.  Duration of Discharge Encounter   Greater than 30 minutes including physician time.  Signed, Sondra Barges, PA-C [facility] Pager: [phone] 05/15/2020, 2:46 PM

## 2020-05-15 NOTE — Progress Notes (Signed)
Pre-CABG testing has been completed. Preliminary results can be found in CV Proc through chart review.   05/15/20 5:32 PM Olen Cordial RVT

## 2020-05-15 NOTE — H&P (Signed)
Cardiology Admission History and Physical:   Patient ID: Sean Hubbard MRN: [id number]; DOB: 17-Mar-1941   Admission date: 05/15/2020  Primary Care Provider: Lynnea Ferrier, MD [facility] Cardiologist: Julien Nordmann, MD  [facility] Electrophysiologist:  None  Chief Complaint: Lightheaded and dyspnea  Patient Profile:   Sean Hubbard is a 79 y.o. male with CAD status post post remote stenting x4 to the RCA in [place] in 1997, moderate to severe aortic stenosis, HTN, HLD, COPD, BPPV, and tobacco use who was recently seen in the office on 6/23 with lightheadedness and dyspnea and presented to [facility] on 7/1 for outpatient diagnostic LHC.  History of Present Illness:   Sean Hubbard has a history of remote stenting x4 to the RCA [place] 1997.  He was previously followed by [facility] and underwent myocardial perfusion imaging in 2015 which showed a small perfusion defect of moderate intensity in the inferior region of the stress images.  He has subsequently established with Dr. Mariah Milling in 01/2019 with recommendation to follow-up with echo for new murmur though this was not completed.  More recently, he was seen by Gillian Shields, NP on 05/07/2020 with a 1 month history of constant lightheadedness that felt different than his known BPPV.  He also noted increased fatigue and dyspnea on exertion and tightness in his arms.  Echo on 05/08/2020 showed an EF of 55 to 60%, grade 1 diastolic dysfunction, normal RV systolic function and RV cavity size, moderately dilated left atrium, mildly dilated right atrium, trivial mitral regurgitation, moderate to severe aortic stenosis with a mean gradient of 37 mmHg and VTI of 1.01 cm.  In this setting he underwent outpatient diagnostic cath on 05/15/2020 which demonstrated proximal LAD 95% stenosis, mid LCx 50 to 60% stenosis, dominant RCA with 4 stents from the ostial region extending distally with sequential severe stenosis in the proximal region estimated at 95%  followed by 90% stenosis, distal RCA 80% stenosis prior to the bifurcation of RCA branches, LVEF 55% with severe aortic valve stenosis with a gradient estimated at 40 mmHg plus possibly higher though difficult to determine in the setting of frequent PVCs in a pattern of ventricular bigeminy.  Given the patient's severity of his coronary disease, underlying aortic valve stenosis, and worsening symptoms of unstable angina over the past several weeks it was recommended the patient be directly transferred from [facility] to [facility] for consideration of CABG and AVR.   Past Medical History:  Diagnosis Date  . Gout   . Hyperlipemia   . Hypertension     Past Surgical History:  Procedure Laterality Date  . CARDIAC CATHETERIZATION    . CORONARY ANGIOPLASTY WITH STENT PLACEMENT  07/18/1996   x 4 to RCA in [place] Dr. Edilia Bo  . ROTATOR CUFF REPAIR Left      Medications Prior to Admission: Prior to Admission medications   Medication Sig Start Date End Date Taking? Authorizing Provider  allopurinol (ZYLOPRIM) 100 MG tablet Take 100 mg by mouth daily.   Yes [provider]  Ascorbic Acid (VITAMIN C) 1000 MG tablet Take 1,000 mg by mouth daily.   Yes [provider]  aspirin EC 81 MG tablet Take 81 mg by mouth daily. Swallow whole.   Yes [provider]  atorvastatin (LIPITOR) 40 MG tablet Take 40 mg by mouth daily at 6 PM.  07/19/19  Yes [provider]  cholecalciferol (VITAMIN D) 25 MCG (1000 UNIT) tablet Take 1,000 Units by mouth daily.  Yes [provider]  Coenzyme Q10 (CO Q 10) 100 MG CAPS Take 100 mg by mouth daily.   Yes [provider]  ezetimibe (ZETIA) 10 MG tablet Take 1 tablet (10 mg total) by mouth daily. Please call to schedule appointment for further refills. Thank you! Patient taking differently: Take 10 mg by mouth daily at 6 PM.  01/31/20  Yes Gollan, Tollie Pizzaimothy J, MD  KRILL OIL PO Take 750 mg by mouth daily.   Yes [provider]  metoprolol succinate (TOPROL-XL) 50 MG 24 hr tablet Take 50 mg by mouth daily at 6 PM. Take with or immediately following a meal.    Yes [provider]  Multiple Vitamins-Minerals (MULTIVITAMIN WITH MINERALS) tablet Take 1 tablet by mouth daily.   Yes [provider]  omeprazole (PRILOSEC) 20 MG capsule Take 20 mg by mouth daily.  09/11/19 09/10/20 Yes [provider]  OVER THE COUNTER MEDICATION Apply 1 application topically 2 (two) times daily. Vidacann  Arthritis cream   Yes [provider]  Triamcinolone Acetonide (NASACORT ALLERGY 24HR NA) Place 1 spray into the nose daily.   Yes [provider]  Turmeric 500 MG TABS Take 1,000 mg by mouth daily.   Yes [provider]     Allergies:   No Known Allergies  Social History:   Social History   Socioeconomic History  . Marital status: Married    Spouse name: Not on file  . Number of children: Not on file  . Years of education: Not on file  . Highest education level: Not on file  Occupational History  . Not on file  Tobacco Use  . Smoking status: Current Some Day Smoker    Types: Cigars  . Smokeless tobacco: Never Used  Vaping Use  . Vaping Use: Never used  Substance and Sexual Activity  . Alcohol use: Yes    Alcohol/week: 1.0 standard drink    Types: 1 Shots of liquor per week  . Drug use: Never  . Sexual activity: Yes    Birth control/protection: None  Other Topics Concern  . Not on file  Social History Narrative   Lives at home with wife, Sean Hubbard   Social Determinants of Health   Financial Resource Strain:   . Difficulty of Paying Living Expenses:   Food Insecurity:   . Worried About Programme researcher, broadcasting/film/videounning Out of Food in the Last Year:   . Baristaan Out of Food in the Last Year:   Transportation Needs:   . Freight forwarderLack of Transportation (Medical):   Marland Kitchen. Lack of Transportation (Non-Medical):   Physical Activity:   . Days of Exercise per Week:   . Minutes of Exercise per  Session:   Stress:   . Feeling of Stress :   Social Connections:   . Frequency of Communication with Friends and Family:   . Frequency of Social Gatherings with Friends and Family:   . Attends Religious Services:   . Active Member of Clubs or Organizations:   . Attends BankerClub or Organization Meetings:   Marland Kitchen. Marital Status:   Intimate Partner Violence:   . Fear of Current or Ex-Partner:   . Emotionally Abused:   Marland Kitchen. Physically Abused:   . Sexually Abused:     Family History:   The patient's family history includes Cancer in his father and mother.    ROS:  Please see the history of present illness.  All other ROS reviewed and negative.     Physical Exam/Data:  Vitals:   05/15/20 0919 05/15/20 1245 05/15/20 1300  BP: (!) 164/106 (!) 156/101 (!) 160/101  Pulse: 88 81 83  Resp: 18 18 19   Temp: 98.4 F (36.9 C)    TempSrc: Oral    SpO2: 96% 100% 98%  Weight: 110.7 kg    Height: 5' 8.5" (1.74 m)     No intake or output data in the 24 hours ending 05/15/20 1312 Last 3 Weights 05/15/2020 05/07/2020 01/27/2020  Weight (lbs) 244 lb 246 lb 2 oz 248 lb  Weight (kg) 110.678 kg 111.642 kg 112.492 kg     Body mass index is 36.56 kg/m.  General:  Well nourished, well developed, in no acute distress HEENT: normal Lymph: no adenopathy Neck: no JVD Endocrine:  No thryomegaly Vascular: No carotid bruits; FA pulses 2+ bilaterally without bruits  Cardiac:  normal S1, S2; RRR; III/VI systolic murmur RUSB  Lungs:  clear to auscultation bilaterally, no wheezing, rhonchi or rales  Abd: soft, nontender, no hepatomegaly  Ext: no edema Musculoskeletal:  No deformities, BUE and BLE strength normal and equal Skin: warm and dry  Neuro:  CNs 2-12 intact, no focal abnormalities noted Psych:  Normal affect    EKG:  The ECG that was done 05/07/2020 was personally reviewed and demonstrates NSR, 82 bpm, occasional PACs, poor R wave progression on precordial leads, prior inferior septal infarct, lateral T  wave inversion  Relevant CV Studies:  2D echo 05/08/2020: 1. Left ventricular ejection fraction, by estimation, is 55 to 60%. The  left ventricle has normal function. Left ventricular endocardial border  not optimally defined to evaluate regional wall motion. Left ventricular  diastolic parameters are consistent  with Grade I diastolic dysfunction (impaired relaxation).  2. Right ventricular systolic function is normal. The right ventricular  size is normal. Tricuspid regurgitation signal is inadequate for assessing  PA pressure.  3. Left atrial size was moderately dilated.  4. Right atrial size was mildly dilated.  5. The mitral valve is normal in structure. Trivial mitral valve  regurgitation. No evidence of mitral stenosis.  6. The aortic valve is abnormal. Aortic valve regurgitation is not  visualized. Moderate to severe aortic valve stenosis. Aortic valve area,  by VTI measures 1.01 cm. Aortic valve mean gradient measures 37.0 mmHg. __________  LHC 05/15/2020: Diagnostic Dominance: Right Left Anterior Descending  Prox LAD lesion is 95% stenosed.  Left Circumflex  Prox Cx to Mid Cx lesion is 60% stenosed.  Right Coronary Artery  Ost RCA to Prox RCA lesion is 95% stenosed. The lesion was previously treated.  Prox RCA lesion is 90% stenosed.  Dist RCA lesion is 80% stenosed.  Intervention  No interventions have been documented. Wall Motion  Resting               Coronary Diagrams  Diagnostic Dominance: Right    Laboratory Data:  High Sensitivity Troponin:  No results for input(s): TROPONINIHS in the last 720 hours.    ChemistryNo results for input(s): NA, K, CL, CO2, GLUCOSE, BUN, CREATININE, CALCIUM, GFRNONAA, GFRAA, ANIONGAP in the last 168 hours.  No results for input(s): PROT, ALBUMIN, AST, ALT, ALKPHOS, BILITOT in the last 168 hours. HematologyNo results for input(s): WBC, RBC, HGB, HCT, MCV, MCH, MCHC, RDW, PLT in the last 168 hours. BNPNo results  for input(s): BNP, PROBNP in the last 168 hours.  DDimer No results for input(s): DDIMER in the last 168 hours.   Radiology/Studies:   N/A       Assessment  and Plan:   1. CAD involving the native coronary arteries with unstable angina: Currently chest pain-free.  Diagnostic LHC at [facility] on 7/1 demonstrated multivessel CAD as outlined above.  Plan to start heparin infusion 8 hours status post sheath pull.  Continue ASA.  Transfer to [facility] for CVTS evaluation.  Aggressive risk factor modification.  2. Moderate to severe aortic stenosis: Transferred to [facility] for evaluation of AVR.  3. HTN: Blood pressure mildly elevated post-cath.  For now, continue Toprol-XL and monitor.  4. HLD: LDL of 60 from 12/2019.  Continue atorvastatin and Zetia. 5. GERD: Protonix.  6. Gout: PTA allopurinol.   Patient to be admitted to [facility] while awaiting bed assignment at [facility].   Severity of Illness: The appropriate patient status for this patient is OBSERVATION. Observation status is judged to be reasonable and necessary in order to provide the required intensity of service to ensure the patient's safety. The patient's presenting symptoms, physical exam findings, and initial radiographic and laboratory data in the context of their medical condition is felt to place them at decreased risk for further clinical deterioration. Furthermore, it is anticipated that the patient will be medically stable for discharge from the hospital within 2 midnights of admission. The following factors support the patient status of observation.   " The patient's presenting symptoms include unstable angina. " The physical exam findings include cardiac murmur. " The initial radiographic and laboratory data are severe aortic stenosis with multivessel coronary artery disease.     For questions or updates, please contact [facility] Please consult [email or web address] for contact info under     Signed, Eula Listen, PA-C    05/15/2020 1:12 PM

## 2020-05-15 NOTE — Progress Notes (Signed)
Cardiac catheterization performed today Severe aortic valve stenosis gradient 40 mmHg plus Also with critical proximal LAD disease 95%, Severe proximal sequential lesions in the RCA in-stent restenosis, 90%  and distal RCA 80% Given severity of disease and unstable angina symptoms, recommend transfer to [facility] for consideration of AVR and CABG  Signed, Dossie Arbour, MD, Ph.D [facility]

## 2020-05-15 NOTE — H&P (Signed)
Cardiology Admission History and Physical:   Patient ID: RYNELL CIOTTI MRN: [id number]; DOB: 06-24-1941   Admission date: (Not on file)  Primary Care Provider: Lynnea Ferrier, MD [facility] Cardiologist: Julien Nordmann, MD  [facility] Electrophysiologist:  None   Chief Complaint: Severe aortic stenosis with multivessel CAD  Patient Profile:   Sean Hubbard is a 79 y.o. male with CAD status post post remote stenting x4 to the RCA in [place] in 1997, moderate to severe aortic stenosis, HTN, HLD, COPD, BPPV, and tobacco use who was recently seen in the office on 6/23 with lightheadedness and dyspnea and presented to [facility] on 7/1 for outpatient diagnostic LHC and was found to have multivessel CAD with severe aortic stenosis with recommendation to transfer to [facility].  History of Present Illness:   Sean Hubbard has a history of remote stenting x4 to the RCA [place] 1997.  He was previously followed by [facility] and underwent myocardial perfusion imaging in 2015 which showed a small perfusion defect of moderate intensity in the inferior region of the stress images.  He has subsequently established with Dr. Mariah Milling in 01/2019 with recommendation to follow-up with echo for new murmur though this was not completed.  More recently, he was seen by Gillian Shields, NP on 05/07/2020 with a 1 month history of constant lightheadedness that felt different than his known BPPV.  He also noted increased fatigue and dyspnea on exertion and tightness in his arms.  Echo on 05/08/2020 showed an EF of 55 to 60%, grade 1 diastolic dysfunction, normal RV systolic function and RV cavity size, moderately dilated left atrium, mildly dilated right atrium, trivial mitral regurgitation, moderate to severe aortic stenosis with a mean gradient of 37 mmHg and VTI of 1.01 cm.  In this setting he underwent outpatient diagnostic cath on 05/15/2020 which demonstrated proximal LAD 95% stenosis, mid LCx 50 to 60% stenosis, dominant  RCA with 4 stents from the ostial region extending distally with sequential severe stenosis in the proximal region estimated at 95% followed by 90% stenosis, distal RCA 80% stenosis prior to the bifurcation of RCA branches, LVEF 55% with severe aortic valve stenosis with a gradient estimated at 40 mmHg plus possibly higher though difficult to determine in the setting of frequent PVCs in a pattern of ventricular bigeminy.  Given the patient's severity of his coronary disease, underlying aortic valve stenosis, and worsening symptoms of unstable angina over the past several weeks it was recommended the patient be directly transferred from [facility] to [facility] for consideration of CABG and AVR.   Past Medical History:  Diagnosis Date  . Aortic stenosis    a. TTE 04/2020: EF 55 to 60%, Gr1DD, nl RVSF & cavity size, mod dilated LA, mildly dilated RA, trivial mitral regurg, mod-sev aoritc stenosis w/ mean gradient of and VTI 1.01 cm  . CAD (coronary artery disease)    a. PCI x 4 to RCA in 1997 in [place]; b. LHC 05/15/2020: pLAD 95%, mLCx 50-60%, dominant RCA w/ 4 stents from the ostial region extending distally with sequential severe stenosis in the proximal RCA estimated at 95% followed by 90% stenosis, dRCA 80% prior to the bifurcation of RCA branches  . Gout   . Hyperlipemia   . Hypertension     Past Surgical History:  Procedure Laterality Date  . CARDIAC CATHETERIZATION    . CORONARY ANGIOPLASTY WITH STENT PLACEMENT  07/18/1996   x 4 to RCA in [place] Dr.  Edilia Bo  . ROTATOR CUFF REPAIR Left      Medications Prior to Admission: Prior to Admission medications   Medication Sig Start Date End Date Taking? Authorizing Provider  allopurinol (ZYLOPRIM) 100 MG tablet Take 100 mg by mouth daily.    [provider]  Ascorbic Acid (VITAMIN C) 1000 MG tablet Take 1,000 mg by mouth daily.    [provider]  aspirin EC 81 MG tablet Take 81 mg by mouth daily. Swallow whole.    [provider]  atorvastatin (LIPITOR) 40 MG tablet Take 40 mg by mouth daily at 6 PM.  07/19/19   [provider]  cholecalciferol (VITAMIN D) 25 MCG (1000 UNIT) tablet Take 1,000 Units by mouth daily.    [provider]  Coenzyme Q10 (CO Q 10) 100 MG CAPS Take 100 mg by mouth daily.    [provider]  ezetimibe (ZETIA) 10 MG tablet Take 1 tablet (10 mg total) by mouth daily. Please call to schedule appointment for further refills. Thank you! Patient taking differently: Take 10 mg by mouth daily at 6 PM.  01/31/20   Gollan, Tollie Pizza, MD  KRILL OIL PO Take 750 mg by mouth daily.    [provider]  metoprolol succinate (TOPROL-XL) 50 MG 24 hr tablet Take 50 mg by mouth daily at 6 PM. Take with or immediately following a meal.     [provider]  Multiple Vitamins-Minerals (MULTIVITAMIN WITH MINERALS) tablet Take 1 tablet by mouth daily.    [provider]  omeprazole (PRILOSEC) 20 MG capsule Take 20 mg by mouth daily.  09/11/19 09/10/20  [provider]  OVER THE COUNTER MEDICATION Apply 1 application topically 2 (two) times daily. Vidacann  Arthritis cream    [provider]  Triamcinolone Acetonide (NASACORT ALLERGY 24HR NA) Place 1 spray into the nose daily.    [provider]  Turmeric 500 MG TABS Take 1,000 mg by mouth daily.    [provider]     Allergies:   No Known Allergies  Social History:   Social History   Socioeconomic History  . Marital status: Married    Spouse name: Not on file  . Number of children: Not on file  . Years of education: Not on file  . Highest education level: Not on file  Occupational History  . Not on file  Tobacco Use  . Smoking status: Current Some Day Smoker    Types: Cigars  . Smokeless tobacco: Never Used  Vaping Use  . Vaping Use: Never used  Substance and Sexual Activity  . Alcohol use: Yes    Alcohol/week: 1.0 standard drink    Types: 1 Shots of  liquor per week  . Drug use: Never  . Sexual activity: Yes    Birth control/protection: None  Other Topics Concern  . Not on file  Social History Narrative   Lives at home with wife, Maurine   Social Determinants of Health   Financial Resource Strain:   . Difficulty of Paying Living Expenses:   Food Insecurity:   . Worried About Programme researcher, broadcasting/film/video in the Last Year:   . Barista in the Last Year:   Transportation Needs:   . Freight forwarder (Medical):   Marland Kitchen Lack of Transportation (Non-Medical):   Physical Activity:   . Days of Exercise per Week:   . Minutes of Exercise per Session:   Stress:   . Feeling of  Stress :   Social Connections:   . Frequency of Communication with Friends and Family:   . Frequency of Social Gatherings with Friends and Family:   . Attends Religious Services:   . Active Member of Clubs or Organizations:   . Attends BankerClub or Organization Meetings:   Marland Kitchen. Marital Status:   Intimate Partner Violence:   . Fear of Current or Ex-Partner:   . Emotionally Abused:   Marland Kitchen. Physically Abused:   . Sexually Abused:     Family History:   The patient's family history includes Cancer in his father and mother.    ROS:  Please see the history of present illness.  All other ROS reviewed and negative.     Physical Exam/Data:   Vitals:   05/15/20 0919 05/15/20 1245 05/15/20 1300  BP: (!) 164/106 (!) 156/101 (!) 160/101  Pulse: 88 81 83  Resp: 18 18 19   Temp: 98.4 F (36.9 C)    TempSrc: Oral    SpO2: 96% 100% 98%  Weight: 110.7 kg    Height: 5' 8.5" (1.74 m)     No intake or output data in the 24 hours ending 05/15/20 1312 Last 3 Weights 05/15/2020 05/07/2020 01/27/2020  Weight (lbs) 244 lb 246 lb 2 oz 248 lb  Weight (kg) 110.678 kg 111.642 kg 112.492 kg     Body mass index is 36.56 kg/m.   General:  Well nourished, well developed, in no acute distress HEENT: normal Lymph: no adenopathy Neck: no JVD Endocrine:  No thryomegaly Vascular:  No carotid bruits; FA pulses 2+ bilaterally without bruits  Cardiac:  normal S1, S2; RRR; no murmur  Lungs:  clear to auscultation bilaterally, no wheezing, rhonchi or rales  Abd: soft, nontender, no hepatomegaly  Ext: no edema Musculoskeletal:  No deformities, BUE and BLE strength normal and equal Skin: warm and dry  Neuro:  CNs 2-12 intact, no focal abnormalities noted Psych:  Normal affect    EKG:  The ECG that was done 05/07/2020 was personally reviewed and demonstrates NSR, 82 bpm, occasional PACs, poor R wave progression on precordial leads, prior inferior septal infarct, lateral T wave inversion  Relevant CV Studies:  2D echo 05/08/2020: 1. Left ventricular ejection fraction, by estimation, is 55 to 60%. The  left ventricle has normal function. Left ventricular endocardial border  not optimally defined to evaluate regional wall motion. Left ventricular  diastolic parameters are consistent  with Grade I diastolic dysfunction (impaired relaxation).  2. Right ventricular systolic function is normal. The right ventricular  size is normal. Tricuspid regurgitation signal is inadequate for assessing  PA pressure.  3. Left atrial size was moderately dilated.  4. Right atrial size was mildly dilated.  5. The mitral valve is normal in structure. Trivial mitral valve  regurgitation. No evidence of mitral stenosis.  6. The aortic valve is abnormal. Aortic valve regurgitation is not  visualized. Moderate to severe aortic valve stenosis. Aortic valve area,  by VTI measures 1.01 cm. Aortic valve mean gradient measures 37.0 mmHg. __________  LHC 05/15/2020: Diagnostic Dominance: Right Left Anterior Descending  Prox LAD lesion is 95% stenosed.  Left Circumflex  Prox Cx to Mid Cx lesion is 60% stenosed.  Right Coronary Artery  Ost RCA to Prox RCA lesion is 95% stenosed. The lesion was previously treated.  Prox RCA lesion is 90% stenosed.  Dist RCA lesion is 80% stenosed.    Intervention  No interventions have been documented. Wall Motion  Resting               Coronary Diagrams  Diagnostic Dominance: Right     Laboratory Data:  High Sensitivity Troponin:  No results for input(s): TROPONINIHS in the last 720 hours.    ChemistryNo results for input(s): NA, K, CL, CO2, GLUCOSE, BUN, CREATININE, CALCIUM, GFRNONAA, GFRAA, ANIONGAP in the last 168 hours.  No results for input(s): PROT, ALBUMIN, AST, ALT, ALKPHOS, BILITOT in the last 168 hours. HematologyNo results for input(s): WBC, RBC, HGB, HCT, MCV, MCH, MCHC, RDW, PLT in the last 168 hours. BNPNo results for input(s): BNP, PROBNP in the last 168 hours.  DDimer No results for input(s): DDIMER in the last 168 hours.   Radiology/Studies:  N/A{   Assessment and Plan:   1. CAD involving the native coronary arteries with unstable angina: Currently chest pain-free.  Diagnostic LHC at [facility] on 7/1 demonstrated multivessel CAD as outlined above.  Plan to start heparin infusion 8 hours status post sheath pull.  Continue ASA, atorvastatin, Zetia, metoprolol succinate.    Admit to [facility] with consult to CVTS.  Aggressive risk factor modification.  2. Moderate to severe aortic stenosis: Transferred to [facility] for evaluation of AVR.  3. HTN: Blood pressure mildly elevated post-cath.  For now, continue Toprol-XL and monitor.  4. HLD: LDL of 60 from 12/2019.  Continue atorvastatin and Zetia.  5. GERD: Protonix.  6. Gout: PTA allopurinol  Severity of Illness: The appropriate patient status for this patient is INPATIENT. Inpatient status is judged to be reasonable and necessary in order to provide the required intensity of service to ensure the patient's safety. The patient's presenting symptoms, physical exam findings, and initial radiographic and laboratory data in the context of their chronic comorbidities is felt to place them at high risk for further clinical deterioration.  Furthermore, it is not anticipated that the patient will be medically stable for discharge from the hospital within 2 midnights of admission. The following factors support the patient status of inpatient.   " The patient's presenting symptoms include worsening exertional dyspnea, fatigue, lightheadedness. " The worrisome physical exam findings include new murmur. " The initial radiographic and laboratory data are worrisome because of severe aortic stenosis with multivessel CAD. " The chronic co-morbidities include hypertension, hyperlipidemia.   * I certify that at the point of admission it is my clinical judgment that the patient will require inpatient hospital care spanning beyond 2 midnights from the point of admission due to high intensity of service, high risk for further deterioration and high frequency of surveillance required.*    For questions or updates, please contact [facility] Please consult [email or web address] for contact info under     Signed, Eula Listen, PA-C  05/15/2020 1:50 PM

## 2020-05-16 ENCOUNTER — Encounter (HOSPITAL_COMMUNITY): Payer: Self-pay | Admitting: Cardiology

## 2020-05-16 ENCOUNTER — Inpatient Hospital Stay (HOSPITAL_COMMUNITY): Payer: Medicare Other | Admitting: Certified Registered"

## 2020-05-16 ENCOUNTER — Inpatient Hospital Stay (HOSPITAL_COMMUNITY): Payer: Medicare Other

## 2020-05-16 ENCOUNTER — Inpatient Hospital Stay (HOSPITAL_COMMUNITY)
Admission: AD | Disposition: A | Discharge status: Home Health | Payer: Self-pay | Source: Other Acute Inpatient Hospital | Attending: Cardiothoracic Surgery

## 2020-05-16 ENCOUNTER — Inpatient Hospital Stay (HOSPITAL_COMMUNITY): Admission: RE | Admit: 2020-05-16 | Payer: Medicare Other | Source: Home / Self Care | Admitting: Cardiothoracic Surgery

## 2020-05-16 DIAGNOSIS — Z951 Presence of aortocoronary bypass graft: Secondary | ICD-10-CM

## 2020-05-16 HISTORY — PX: CORONARY ARTERY BYPASS GRAFT: SHX141

## 2020-05-16 HISTORY — PX: ENDOVEIN HARVEST OF GREATER SAPHENOUS VEIN: SHX5059

## 2020-05-16 HISTORY — PX: TEE WITHOUT CARDIOVERSION: SHX5443

## 2020-05-16 HISTORY — PX: AORTIC VALVE REPLACEMENT: SHX41

## 2020-05-16 LAB — COMPREHENSIVE METABOLIC PANEL
ALT: 17 U/L (ref 0–44)
AST: 45 U/L — ABNORMAL HIGH (ref 15–41)
Albumin: 3 g/dL — ABNORMAL LOW (ref 3.5–5.0)
Alkaline Phosphatase: 29 U/L — ABNORMAL LOW (ref 38–126)
Anion gap: 12 (ref 5–15)
BUN: 12 mg/dL (ref 8–23)
CO2: 19 mmol/L — ABNORMAL LOW (ref 22–32)
Calcium: 7.7 mg/dL — ABNORMAL LOW (ref 8.9–10.3)
Chloride: 109 mmol/L (ref 98–111)
Creatinine, Ser: 0.99 mg/dL (ref 0.61–1.24)
GFR calc Af Amer: >60 mL/min (ref >60)
GFR calc non Af Amer: >60 mL/min (ref >60)
Glucose, Bld: 216 mg/dL — ABNORMAL HIGH (ref 70–99)
Potassium: 4.4 mmol/L (ref 3.5–5.1)
Sodium: 140 mmol/L (ref 135–145)
Total Bilirubin: 1.9 mg/dL — ABNORMAL HIGH (ref 0.3–1.2)
Total Protein: 4.4 g/dL — ABNORMAL LOW (ref 6.5–8.1)

## 2020-05-16 LAB — POCT I-STAT, CHEM 8
BUN: 14 mg/dL (ref 8–23)
BUN: 16 mg/dL (ref 8–23)
BUN: 14 mg/dL (ref 8–23)
BUN: 13 mg/dL (ref 8–23)
BUN: 15 mg/dL (ref 8–23)
BUN: 15 mg/dL (ref 8–23)
BUN: 14 mg/dL (ref 8–23)
Calcium, Ion: 1.26 mmol/L (ref 1.15–1.40)
Calcium, Ion: 1.26 mmol/L (ref 1.15–1.40)
Calcium, Ion: 1.08 mmol/L — ABNORMAL LOW (ref 1.15–1.40)
Calcium, Ion: 1.03 mmol/L — ABNORMAL LOW (ref 1.15–1.40)
Calcium, Ion: 0.99 mmol/L — ABNORMAL LOW (ref 1.15–1.40)
Calcium, Ion: 0.99 mmol/L — ABNORMAL LOW (ref 1.15–1.40)
Calcium, Ion: 1.02 mmol/L — ABNORMAL LOW (ref 1.15–1.40)
Chloride: 103 mmol/L (ref 98–111)
Chloride: 104 mmol/L (ref 98–111)
Chloride: 102 mmol/L (ref 98–111)
Chloride: 102 mmol/L (ref 98–111)
Chloride: 101 mmol/L (ref 98–111)
Chloride: 103 mmol/L (ref 98–111)
Chloride: 103 mmol/L (ref 98–111)
Creatinine, Ser: 0.7 mg/dL (ref 0.61–1.24)
Creatinine, Ser: 0.8 mg/dL (ref 0.61–1.24)
Creatinine, Ser: 0.7 mg/dL (ref 0.61–1.24)
Creatinine, Ser: 0.6 mg/dL — ABNORMAL LOW (ref 0.61–1.24)
Creatinine, Ser: 0.6 mg/dL — ABNORMAL LOW (ref 0.61–1.24)
Creatinine, Ser: 0.6 mg/dL — ABNORMAL LOW (ref 0.61–1.24)
Creatinine, Ser: 0.7 mg/dL (ref 0.61–1.24)
Glucose, Bld: 131 mg/dL — ABNORMAL HIGH (ref 70–99)
Glucose, Bld: 154 mg/dL — ABNORMAL HIGH (ref 70–99)
Glucose, Bld: 163 mg/dL — ABNORMAL HIGH (ref 70–99)
Glucose, Bld: 182 mg/dL — ABNORMAL HIGH (ref 70–99)
Glucose, Bld: 180 mg/dL — ABNORMAL HIGH (ref 70–99)
Glucose, Bld: 183 mg/dL — ABNORMAL HIGH (ref 70–99)
Glucose, Bld: 199 mg/dL — ABNORMAL HIGH (ref 70–99)
HCT: 41 % (ref 39.0–52.0)
HCT: 40 % (ref 39.0–52.0)
HCT: 31 % — ABNORMAL LOW (ref 39.0–52.0)
HCT: 29 % — ABNORMAL LOW (ref 39.0–52.0)
HCT: 22 % — ABNORMAL LOW (ref 39.0–52.0)
HCT: 26 % — ABNORMAL LOW (ref 39.0–52.0)
HCT: 24 % — ABNORMAL LOW (ref 39.0–52.0)
Hemoglobin: 13.9 g/dL (ref 13.0–17.0)
Hemoglobin: 13.6 g/dL (ref 13.0–17.0)
Hemoglobin: 10.5 g/dL — ABNORMAL LOW (ref 13.0–17.0)
Hemoglobin: 9.9 g/dL — ABNORMAL LOW (ref 13.0–17.0)
Hemoglobin: 7.5 g/dL — ABNORMAL LOW (ref 13.0–17.0)
Hemoglobin: 8.8 g/dL — ABNORMAL LOW (ref 13.0–17.0)
Hemoglobin: 8.2 g/dL — ABNORMAL LOW (ref 13.0–17.0)
Potassium: 3.9 mmol/L (ref 3.5–5.1)
Potassium: 5.1 mmol/L (ref 3.5–5.1)
Potassium: 5.7 mmol/L — ABNORMAL HIGH (ref 3.5–5.1)
Potassium: 5.6 mmol/L — ABNORMAL HIGH (ref 3.5–5.1)
Potassium: 5.3 mmol/L — ABNORMAL HIGH (ref 3.5–5.1)
Potassium: 5.8 mmol/L — ABNORMAL HIGH (ref 3.5–5.1)
Potassium: 4.9 mmol/L (ref 3.5–5.1)
Sodium: 140 mmol/L (ref 135–145)
Sodium: 138 mmol/L (ref 135–145)
Sodium: 137 mmol/L (ref 135–145)
Sodium: 137 mmol/L (ref 135–145)
Sodium: 137 mmol/L (ref 135–145)
Sodium: 138 mmol/L (ref 135–145)
Sodium: 139 mmol/L (ref 135–145)
TCO2: 27 mmol/L (ref 22–32)
TCO2: 25 mmol/L (ref 22–32)
TCO2: 26 mmol/L (ref 22–32)
TCO2: 25 mmol/L (ref 22–32)
TCO2: 25 mmol/L (ref 22–32)
TCO2: 26 mmol/L (ref 22–32)
TCO2: 22 mmol/L (ref 22–32)

## 2020-05-16 LAB — POCT I-STAT 7, (LYTES, BLD GAS, ICA,H+H)
Acid-Base Excess: 2 mmol/L (ref 0.0–2.0)
Acid-Base Excess: 0 mmol/L (ref 0.0–2.0)
Acid-base deficit: 1 mmol/L (ref 0.0–2.0)
Acid-base deficit: 1 mmol/L (ref 0.0–2.0)
Acid-base deficit: 5 mmol/L — ABNORMAL HIGH (ref 0.0–2.0)
Acid-base deficit: 3 mmol/L — ABNORMAL HIGH (ref 0.0–2.0)
Acid-base deficit: 7 mmol/L — ABNORMAL HIGH (ref 0.0–2.0)
Acid-base deficit: 7 mmol/L — ABNORMAL HIGH (ref 0.0–2.0)
Bicarbonate: 26.4 mmol/L (ref 20.0–28.0)
Bicarbonate: 28.6 mmol/L — ABNORMAL HIGH (ref 20.0–28.0)
Bicarbonate: 24.7 mmol/L (ref 20.0–28.0)
Bicarbonate: 20.8 mmol/L (ref 20.0–28.0)
Bicarbonate: 25 mmol/L (ref 20.0–28.0)
Bicarbonate: 23.6 mmol/L (ref 20.0–28.0)
Bicarbonate: 19.3 mmol/L — ABNORMAL LOW (ref 20.0–28.0)
Bicarbonate: 19.6 mmol/L — ABNORMAL LOW (ref 20.0–28.0)
Calcium, Ion: 1.25 mmol/L (ref 1.15–1.40)
Calcium, Ion: 0.98 mmol/L — ABNORMAL LOW (ref 1.15–1.40)
Calcium, Ion: 1.02 mmol/L — ABNORMAL LOW (ref 1.15–1.40)
Calcium, Ion: 1 mmol/L — ABNORMAL LOW (ref 1.15–1.40)
Calcium, Ion: 1.02 mmol/L — ABNORMAL LOW (ref 1.15–1.40)
Calcium, Ion: 1.46 mmol/L — ABNORMAL HIGH (ref 1.15–1.40)
Calcium, Ion: 1.14 mmol/L — ABNORMAL LOW (ref 1.15–1.40)
Calcium, Ion: 1.14 mmol/L — ABNORMAL LOW (ref 1.15–1.40)
HCT: 43 % (ref 39.0–52.0)
HCT: 32 % — ABNORMAL LOW (ref 39.0–52.0)
HCT: 30 % — ABNORMAL LOW (ref 39.0–52.0)
HCT: 25 % — ABNORMAL LOW (ref 39.0–52.0)
HCT: 29 % — ABNORMAL LOW (ref 39.0–52.0)
HCT: 24 % — ABNORMAL LOW (ref 39.0–52.0)
HCT: 20 % — ABNORMAL LOW (ref 39.0–52.0)
HCT: 18 % — ABNORMAL LOW (ref 39.0–52.0)
Hemoglobin: 14.6 g/dL (ref 13.0–17.0)
Hemoglobin: 10.9 g/dL — ABNORMAL LOW (ref 13.0–17.0)
Hemoglobin: 10.2 g/dL — ABNORMAL LOW (ref 13.0–17.0)
Hemoglobin: 8.5 g/dL — ABNORMAL LOW (ref 13.0–17.0)
Hemoglobin: 9.9 g/dL — ABNORMAL LOW (ref 13.0–17.0)
Hemoglobin: 8.2 g/dL — ABNORMAL LOW (ref 13.0–17.0)
Hemoglobin: 6.8 g/dL — CL (ref 13.0–17.0)
Hemoglobin: 6.1 g/dL — CL (ref 13.0–17.0)
O2 Saturation: 100 %
O2 Saturation: 100 %
O2 Saturation: 100 %
O2 Saturation: 86 %
O2 Saturation: 97 %
O2 Saturation: 95 %
O2 Saturation: 99 %
O2 Saturation: 99 %
Patient temperature: 36.7
Patient temperature: 36.5
Patient temperature: 36.6
Potassium: 3.9 mmol/L (ref 3.5–5.1)
Potassium: 4.7 mmol/L (ref 3.5–5.1)
Potassium: 5.5 mmol/L — ABNORMAL HIGH (ref 3.5–5.1)
Potassium: 4.1 mmol/L (ref 3.5–5.1)
Potassium: 4.9 mmol/L (ref 3.5–5.1)
Potassium: 4.4 mmol/L (ref 3.5–5.1)
Potassium: 4.6 mmol/L (ref 3.5–5.1)
Potassium: 4.3 mmol/L (ref 3.5–5.1)
Sodium: 140 mmol/L (ref 135–145)
Sodium: 140 mmol/L (ref 135–145)
Sodium: 136 mmol/L (ref 135–145)
Sodium: 139 mmol/L (ref 135–145)
Sodium: 143 mmol/L (ref 135–145)
Sodium: 142 mmol/L (ref 135–145)
Sodium: 142 mmol/L (ref 135–145)
Sodium: 143 mmol/L (ref 135–145)
TCO2: 28 mmol/L (ref 22–32)
TCO2: 30 mmol/L (ref 22–32)
TCO2: 26 mmol/L (ref 22–32)
TCO2: 22 mmol/L (ref 22–32)
TCO2: 26 mmol/L (ref 22–32)
TCO2: 25 mmol/L (ref 22–32)
TCO2: 21 mmol/L — ABNORMAL LOW (ref 22–32)
TCO2: 21 mmol/L — ABNORMAL LOW (ref 22–32)
pCO2 arterial: 51.3 mmHg — ABNORMAL HIGH (ref 32.0–48.0)
pCO2 arterial: 51.8 mmHg — ABNORMAL HIGH (ref 32.0–48.0)
pCO2 arterial: 37.7 mmHg (ref 32.0–48.0)
pCO2 arterial: 39.7 mmHg (ref 32.0–48.0)
pCO2 arterial: 47 mmHg (ref 32.0–48.0)
pCO2 arterial: 50.7 mmHg — ABNORMAL HIGH (ref 32.0–48.0)
pCO2 arterial: 39.6 mmHg (ref 32.0–48.0)
pCO2 arterial: 40.9 mmHg (ref 32.0–48.0)
pH, Arterial: 7.32 — ABNORMAL LOW (ref 7.350–7.450)
pH, Arterial: 7.35 (ref 7.350–7.450)
pH, Arterial: 7.424 (ref 7.350–7.450)
pH, Arterial: 7.327 — ABNORMAL LOW (ref 7.350–7.450)
pH, Arterial: 7.335 — ABNORMAL LOW (ref 7.350–7.450)
pH, Arterial: 7.274 — ABNORMAL LOW (ref 7.350–7.450)
pH, Arterial: 7.293 — ABNORMAL LOW (ref 7.350–7.450)
pH, Arterial: 7.286 — ABNORMAL LOW (ref 7.350–7.450)
pO2, Arterial: 327 mmHg — ABNORMAL HIGH (ref 83.0–108.0)
pO2, Arterial: 349 mmHg — ABNORMAL HIGH (ref 83.0–108.0)
pO2, Arterial: 363 mmHg — ABNORMAL HIGH (ref 83.0–108.0)
pO2, Arterial: 103 mmHg (ref 83.0–108.0)
pO2, Arterial: 54 mmHg — ABNORMAL LOW (ref 83.0–108.0)
pO2, Arterial: 87 mmHg (ref 83.0–108.0)
pO2, Arterial: 129 mmHg — ABNORMAL HIGH (ref 83.0–108.0)
pO2, Arterial: 138 mmHg — ABNORMAL HIGH (ref 83.0–108.0)

## 2020-05-16 LAB — CBC WITH DIFFERENTIAL/PLATELET
Abs Immature Granulocytes: 0.07 10*3/uL (ref 0.00–0.07)
Basophils Absolute: 0 10*3/uL (ref 0.0–0.1)
Basophils Relative: 0 %
Eosinophils Absolute: 0 10*3/uL (ref 0.0–0.5)
Eosinophils Relative: 0 %
HCT: 21.1 % — ABNORMAL LOW (ref 39.0–52.0)
Hemoglobin: 6.7 g/dL — CL (ref 13.0–17.0)
Immature Granulocytes: 1 %
Lymphocytes Relative: 6 %
Lymphs Abs: 0.5 10*3/uL — ABNORMAL LOW (ref 0.7–4.0)
MCH: 30.3 pg (ref 26.0–34.0)
MCHC: 31.8 g/dL (ref 30.0–36.0)
MCV: 95.5 fL (ref 80.0–100.0)
Monocytes Absolute: 0.7 10*3/uL (ref 0.1–1.0)
Monocytes Relative: 8 %
Neutro Abs: 7 10*3/uL (ref 1.7–7.7)
Neutrophils Relative %: 85 %
Platelets: 86 10*3/uL — ABNORMAL LOW (ref 150–400)
RBC: 2.21 MIL/uL — ABNORMAL LOW (ref 4.22–5.81)
RDW: 13.4 % (ref 11.5–15.5)
WBC: 8.1 10*3/uL (ref 4.0–10.5)
nRBC: 0 % (ref 0.0–0.2)

## 2020-05-16 LAB — BASIC METABOLIC PANEL
Anion gap: 9 (ref 5–15)
BUN: 16 mg/dL (ref 8–23)
CO2: 27 mmol/L (ref 22–32)
Calcium: 8.8 mg/dL — ABNORMAL LOW (ref 8.9–10.3)
Chloride: 104 mmol/L (ref 98–111)
Creatinine, Ser: 1.07 mg/dL (ref 0.61–1.24)
GFR calc Af Amer: >60 mL/min (ref >60)
GFR calc non Af Amer: >60 mL/min (ref >60)
Glucose, Bld: 121 mg/dL — ABNORMAL HIGH (ref 70–99)
Potassium: 4 mmol/L (ref 3.5–5.1)
Sodium: 140 mmol/L (ref 135–145)

## 2020-05-16 LAB — BLOOD GAS, ARTERIAL
Acid-Base Excess: 0.7 mmol/L (ref 0.0–2.0)
Bicarbonate: 25 mmol/L (ref 20.0–28.0)
Drawn by: 418751
FIO2: 21
O2 Saturation: 95.7 %
Patient temperature: 37
pCO2 arterial: 42.1 mmHg (ref 32.0–48.0)
pH, Arterial: 7.392 (ref 7.350–7.450)
pO2, Arterial: 80 mmHg — ABNORMAL LOW (ref 83.0–108.0)

## 2020-05-16 LAB — GLUCOSE, CAPILLARY
Glucose-Capillary: 164 mg/dL — ABNORMAL HIGH (ref 70–99)
Glucose-Capillary: 195 mg/dL — ABNORMAL HIGH (ref 70–99)
Glucose-Capillary: 196 mg/dL — ABNORMAL HIGH (ref 70–99)
Glucose-Capillary: 197 mg/dL — ABNORMAL HIGH (ref 70–99)
Glucose-Capillary: 198 mg/dL — ABNORMAL HIGH (ref 70–99)
Glucose-Capillary: 198 mg/dL — ABNORMAL HIGH (ref 70–99)
Glucose-Capillary: 222 mg/dL — ABNORMAL HIGH (ref 70–99)
Glucose-Capillary: 223 mg/dL — ABNORMAL HIGH (ref 70–99)
Glucose-Capillary: 238 mg/dL — ABNORMAL HIGH (ref 70–99)

## 2020-05-16 LAB — CBC
HCT: 47 % (ref 39.0–52.0)
HCT: 27.5 % — ABNORMAL LOW (ref 39.0–52.0)
Hemoglobin: 15.3 g/dL (ref 13.0–17.0)
Hemoglobin: 8.8 g/dL — ABNORMAL LOW (ref 13.0–17.0)
MCH: 30.5 pg (ref 26.0–34.0)
MCH: 31 pg (ref 26.0–34.0)
MCHC: 32.6 g/dL (ref 30.0–36.0)
MCHC: 32 g/dL (ref 30.0–36.0)
MCV: 93.8 fL (ref 80.0–100.0)
MCV: 96.8 fL (ref 80.0–100.0)
Platelets: 143 10*3/uL — ABNORMAL LOW (ref 150–400)
Platelets: 95 10*3/uL — ABNORMAL LOW (ref 150–400)
RBC: 5.01 MIL/uL (ref 4.22–5.81)
RBC: 2.84 MIL/uL — ABNORMAL LOW (ref 4.22–5.81)
RDW: 13.3 % (ref 11.5–15.5)
RDW: 13.2 % (ref 11.5–15.5)
WBC: 6.1 10*3/uL (ref 4.0–10.5)
WBC: 15.4 10*3/uL — ABNORMAL HIGH (ref 4.0–10.5)
nRBC: 0 % (ref 0.0–0.2)
nRBC: 0 % (ref 0.0–0.2)

## 2020-05-16 LAB — PLATELET COUNT: Platelets: 100 10*3/uL — ABNORMAL LOW (ref 150–400)

## 2020-05-16 LAB — HEMOGLOBIN AND HEMATOCRIT, BLOOD
HCT: 27.5 % — ABNORMAL LOW (ref 39.0–52.0)
Hemoglobin: 9 g/dL — ABNORMAL LOW (ref 13.0–17.0)

## 2020-05-16 LAB — URINALYSIS, ROUTINE W REFLEX MICROSCOPIC
Bilirubin Urine: NEGATIVE
Glucose, UA: NEGATIVE mg/dL
Hgb urine dipstick: NEGATIVE
Ketones, ur: NEGATIVE mg/dL
Leukocytes,Ua: NEGATIVE
Nitrite: NEGATIVE
Protein, ur: NEGATIVE mg/dL
Specific Gravity, Urine: 1.04 — ABNORMAL HIGH (ref 1.005–1.030)
pH: 5 (ref 5.0–8.0)

## 2020-05-16 LAB — ECHO INTRAOPERATIVE TEE
Height: 68.5 in
Weight: 3956.8 oz

## 2020-05-16 LAB — PROTIME-INR
INR: 1.1 (ref 0.8–1.2)
INR: 2.1 — ABNORMAL HIGH (ref 0.8–1.2)
Prothrombin Time: 13.5 seconds (ref 11.4–15.2)
Prothrombin Time: 22.7 seconds — ABNORMAL HIGH (ref 11.4–15.2)

## 2020-05-16 LAB — MAGNESIUM: Magnesium: 2.7 mg/dL — ABNORMAL HIGH (ref 1.7–2.4)

## 2020-05-16 LAB — SURGICAL PCR SCREEN
MRSA, PCR: NEGATIVE
Staphylococcus aureus: NEGATIVE

## 2020-05-16 LAB — APTT
aPTT: 34 seconds (ref 24–36)
aPTT: 58 seconds — ABNORMAL HIGH (ref 24–36)

## 2020-05-16 LAB — PHOSPHORUS: Phosphorus: 4.5 mg/dL (ref 2.5–4.6)

## 2020-05-16 LAB — HEMOGLOBIN A1C
Hgb A1c MFr Bld: 5.6 % (ref 4.8–5.6)
Mean Plasma Glucose: 114.02 mg/dL

## 2020-05-16 LAB — PREPARE RBC (CROSSMATCH)

## 2020-05-16 SURGERY — CORONARY ARTERY BYPASS GRAFTING (CABG)
Anesthesia: General | Site: Chest | Laterality: Right

## 2020-05-16 MED ORDER — MORPHINE SULFATE (PF) 2 MG/ML IV SOLN
1.0000 mg | INTRAVENOUS | Status: DC | PRN
  Administered 2020-05-16: 2 mg via INTRAVENOUS
  Administered 2020-05-17: 4 mg via INTRAVENOUS
  Administered 2020-05-17: 2 mg via INTRAVENOUS
  Administered 2020-05-17 (×3): 4 mg via INTRAVENOUS
  Administered 2020-05-19: 2 mg via INTRAVENOUS
  Filled 2020-05-16 (×2): qty 1
  Filled 2020-05-16 (×4): qty 2
  Filled 2020-05-16: qty 1
  Filled 2020-05-16: qty 2

## 2020-05-16 MED ORDER — SODIUM CHLORIDE 0.9 % IV SOLN
1.5000 g | Freq: Two times a day (BID) | INTRAVENOUS | Status: AC
  Administered 2020-05-16 – 2020-05-18 (×4): 1.5 g via INTRAVENOUS
  Filled 2020-05-16 (×5): qty 1.5

## 2020-05-16 MED ORDER — PANTOPRAZOLE SODIUM 40 MG PO TBEC
40.0000 mg | DELAYED_RELEASE_TABLET | Freq: Every day | ORAL | Status: DC
  Administered 2020-05-19 – 2020-05-29 (×11): 40 mg via ORAL
  Filled 2020-05-16 (×11): qty 1

## 2020-05-16 MED ORDER — SODIUM BICARBONATE 8.4 % IV SOLN
100.0000 meq | Freq: Once | INTRAVENOUS | Status: AC
  Administered 2020-05-16: 100 meq via INTRAVENOUS

## 2020-05-16 MED ORDER — ACETAMINOPHEN 500 MG PO TABS: 1000.0000 mg | ORAL_TABLET | Freq: Four times a day (QID) | ORAL | Status: DC

## 2020-05-16 MED ORDER — LACTATED RINGERS IV SOLN: INTRAVENOUS | Status: DC

## 2020-05-16 MED ORDER — HEPARIN SODIUM (PORCINE) 1000 UNIT/ML IJ SOLN
INTRAMUSCULAR | Status: DC | PRN
  Administered 2020-05-16: 39000 [IU] via INTRAVENOUS

## 2020-05-16 MED ORDER — LACTATED RINGERS IV SOLN: INTRAVENOUS | Status: DC | PRN

## 2020-05-16 MED ORDER — LACTATED RINGERS IV SOLN: 500.0000 mL | Freq: Once | INTRAVENOUS | Status: DC | PRN

## 2020-05-16 MED ORDER — SODIUM CHLORIDE 0.9% FLUSH: 3.0000 mL | INTRAVENOUS | Status: DC | PRN

## 2020-05-16 MED ORDER — VANCOMYCIN HCL IN DEXTROSE 1-5 GM/200ML-% IV SOLN
1000.0000 mg | Freq: Once | INTRAVENOUS | Status: AC
  Administered 2020-05-16: 1000 mg via INTRAVENOUS
  Filled 2020-05-16: qty 200

## 2020-05-16 MED ORDER — ACETAMINOPHEN 160 MG/5ML PO SOLN
1000.0000 mg | Freq: Four times a day (QID) | ORAL | Status: DC
  Administered 2020-05-17 (×3): 1000 mg
  Filled 2020-05-16 (×3): qty 40.6

## 2020-05-16 MED ORDER — NITROGLYCERIN IN D5W 200-5 MCG/ML-% IV SOLN: 0.0000 ug/min | INTRAVENOUS | Status: DC

## 2020-05-16 MED ORDER — PROPOFOL 10 MG/ML IV BOLUS
INTRAVENOUS | Status: DC | PRN
  Administered 2020-05-16 (×2): 50 mg via INTRAVENOUS

## 2020-05-16 MED ORDER — FENTANYL CITRATE (PF) 250 MCG/5ML IJ SOLN
INTRAMUSCULAR | Status: DC | PRN
  Administered 2020-05-16: 100 ug via INTRAVENOUS
  Administered 2020-05-16 (×2): 150 ug via INTRAVENOUS
  Administered 2020-05-16 (×3): 100 ug via INTRAVENOUS
  Administered 2020-05-16: 150 ug via INTRAVENOUS
  Administered 2020-05-16: 100 ug via INTRAVENOUS
  Administered 2020-05-16 (×2): 150 ug via INTRAVENOUS

## 2020-05-16 MED ORDER — ASPIRIN EC 325 MG PO TBEC
325.0000 mg | DELAYED_RELEASE_TABLET | Freq: Every day | ORAL | Status: DC
  Administered 2020-05-19: 325 mg via ORAL
  Filled 2020-05-16: qty 1

## 2020-05-16 MED ORDER — SODIUM CHLORIDE 0.45 % IV SOLN: INTRAVENOUS | Status: DC | PRN

## 2020-05-16 MED ORDER — CALCIUM CHLORIDE 10 % IV SOLN
INTRAVENOUS | Status: DC | PRN
  Administered 2020-05-16: 400 mg via INTRAVENOUS
  Administered 2020-05-16 (×3): 300 mg via INTRAVENOUS
  Administered 2020-05-16: 200 mg via INTRAVENOUS
  Administered 2020-05-16: 300 mg via INTRAVENOUS

## 2020-05-16 MED ORDER — PLASMA-LYTE 148 IV SOLN
INTRAVENOUS | Status: DC | PRN
  Administered 2020-05-16: 500 mL via INTRAVASCULAR

## 2020-05-16 MED ORDER — OXYCODONE HCL 5 MG PO TABS
5.0000 mg | ORAL_TABLET | ORAL | Status: DC | PRN
  Administered 2020-05-21: 10 mg via ORAL
  Administered 2020-05-21 (×2): 5 mg via ORAL
  Administered 2020-05-22 (×2): 10 mg via ORAL
  Administered 2020-05-24 – 2020-05-29 (×5): 5 mg via ORAL
  Filled 2020-05-16: qty 2
  Filled 2020-05-16 (×3): qty 1
  Filled 2020-05-16 (×2): qty 2
  Filled 2020-05-16: qty 1
  Filled 2020-05-16: qty 2
  Filled 2020-05-16 (×2): qty 1

## 2020-05-16 MED ORDER — VASOPRESSIN 20 UNIT/ML IV SOLN
INTRAVENOUS | Status: DC | PRN
  Administered 2020-05-16 (×3): 1 [IU] via INTRAVENOUS

## 2020-05-16 MED ORDER — MIDAZOLAM HCL 2 MG/2ML IJ SOLN
2.0000 mg | INTRAMUSCULAR | Status: DC | PRN
  Administered 2020-05-16 – 2020-05-17 (×8): 2 mg via INTRAVENOUS
  Filled 2020-05-16 (×10): qty 2

## 2020-05-16 MED ORDER — SODIUM BICARBONATE 8.4 % IV SOLN
INTRAVENOUS | Status: AC
  Filled 2020-05-16: qty 100

## 2020-05-16 MED ORDER — DOCUSATE SODIUM 100 MG PO CAPS
200.0000 mg | ORAL_CAPSULE | Freq: Every day | ORAL | Status: DC
  Administered 2020-05-19 – 2020-05-23 (×4): 200 mg via ORAL
  Filled 2020-05-16 (×5): qty 2

## 2020-05-16 MED ORDER — MIDAZOLAM HCL 5 MG/5ML IJ SOLN
INTRAMUSCULAR | Status: DC | PRN
  Administered 2020-05-16 (×2): 1 mg via INTRAVENOUS
  Administered 2020-05-16: 3 mg via INTRAVENOUS
  Administered 2020-05-16 (×3): 1 mg via INTRAVENOUS

## 2020-05-16 MED ORDER — BISACODYL 10 MG RE SUPP
10.0000 mg | Freq: Every day | RECTAL | Status: DC
  Administered 2020-05-18: 10 mg via RECTAL
  Filled 2020-05-16: qty 1

## 2020-05-16 MED ORDER — NOREPINEPHRINE 4 MG/250ML-% IV SOLN
2.0000 ug/min | INTRAVENOUS | Status: DC
  Administered 2020-05-16: 42 ug/min via INTRAVENOUS
  Administered 2020-05-16: 2 ug/min via INTRAVENOUS
  Administered 2020-05-17 (×2): 50 ug/min via INTRAVENOUS
  Administered 2020-05-17: 39 ug/min via INTRAVENOUS
  Administered 2020-05-17: 5.333 ug/min via INTRAVENOUS
  Administered 2020-05-17: 52 ug/min via INTRAVENOUS
  Filled 2020-05-16: qty 500
  Filled 2020-05-16: qty 250
  Filled 2020-05-16: qty 500
  Filled 2020-05-16 (×2): qty 250
  Filled 2020-05-16: qty 500

## 2020-05-16 MED ORDER — DEXMEDETOMIDINE HCL IN NACL 400 MCG/100ML IV SOLN
0.0000 ug/kg/h | INTRAVENOUS | Status: DC
  Administered 2020-05-16 – 2020-05-17 (×3): 0.7 ug/kg/h via INTRAVENOUS
  Filled 2020-05-16 (×4): qty 100

## 2020-05-16 MED ORDER — PHENYLEPHRINE 40 MCG/ML (10ML) SYRINGE FOR IV PUSH (FOR BLOOD PRESSURE SUPPORT)
PREFILLED_SYRINGE | INTRAVENOUS | Status: DC | PRN
  Administered 2020-05-16 (×2): 20 ug via INTRAVENOUS

## 2020-05-16 MED ORDER — METOPROLOL TARTRATE 5 MG/5ML IV SOLN
2.5000 mg | INTRAVENOUS | Status: DC | PRN
  Administered 2020-05-17 – 2020-05-18 (×3): 2.5 mg via INTRAVENOUS
  Administered 2020-05-19: 5 mg via INTRAVENOUS
  Filled 2020-05-16 (×5): qty 5

## 2020-05-16 MED ORDER — SODIUM CHLORIDE 0.9 % IV SOLN: INTRAVENOUS | Status: DC

## 2020-05-16 MED ORDER — STERILE WATER FOR IRRIGATION IR SOLN
Status: DC | PRN
  Administered 2020-05-16: 1000 mL

## 2020-05-16 MED ORDER — CALCIUM CHLORIDE 10 % IV SOLN
1.0000 g | Freq: Once | INTRAVENOUS | Status: AC
  Administered 2020-05-16: 1 g via INTRAVENOUS

## 2020-05-16 MED ORDER — STERILE WATER FOR INJECTION IJ SOLN
INTRAMUSCULAR | Status: AC
  Filled 2020-05-16: qty 10

## 2020-05-16 MED ORDER — METOPROLOL TARTRATE 12.5 MG HALF TABLET
12.5000 mg | ORAL_TABLET | Freq: Two times a day (BID) | ORAL | Status: DC
  Filled 2020-05-16: qty 1

## 2020-05-16 MED ORDER — STERILE WATER FOR INJECTION IV SOLN
INTRAVENOUS | Status: DC | PRN
  Administered 2020-05-16: 30 mL

## 2020-05-16 MED ORDER — ALBUMIN HUMAN 5 % IV SOLN
250.0000 mL | INTRAVENOUS | Status: AC | PRN
  Administered 2020-05-16 (×4): 12.5 g via INTRAVENOUS
  Filled 2020-05-16 (×2): qty 250

## 2020-05-16 MED ORDER — VASOPRESSIN 20 UNIT/ML IV SOLN
0.0300 [IU]/min | INTRAVENOUS | Status: DC
  Administered 2020-05-17: 0.03 [IU]/min via INTRAVENOUS
  Filled 2020-05-16: qty 2

## 2020-05-16 MED ORDER — SODIUM CHLORIDE 0.9% IV SOLUTION: Freq: Once | INTRAVENOUS | Status: DC

## 2020-05-16 MED ORDER — ONDANSETRON HCL 4 MG/2ML IJ SOLN: 4.0000 mg | Freq: Four times a day (QID) | INTRAMUSCULAR | Status: DC | PRN

## 2020-05-16 MED ORDER — BUPIVACAINE LIPOSOME 1.3 % IJ SUSP
INTRAMUSCULAR | Status: DC | PRN
  Administered 2020-05-16: 50 mL

## 2020-05-16 MED ORDER — SODIUM BICARBONATE 8.4 % IV SOLN
50.0000 meq | Freq: Once | INTRAVENOUS | Status: AC
  Administered 2020-05-16: 50 meq via INTRAVENOUS

## 2020-05-16 MED ORDER — SODIUM CHLORIDE 0.9% IV SOLUTION: Freq: Once | INTRAVENOUS | Status: AC

## 2020-05-16 MED ORDER — BUPIVACAINE LIPOSOME 1.3 % IJ SUSP
20.0000 mL | Freq: Once | INTRAMUSCULAR | Status: AC
  Administered 2020-05-16: 20 mL
  Filled 2020-05-16: qty 20

## 2020-05-16 MED ORDER — VANCOMYCIN HCL 1000 MG IV SOLR
INTRAVENOUS | Status: DC | PRN
  Administered 2020-05-16: 3 g via TOPICAL

## 2020-05-16 MED ORDER — SODIUM BICARBONATE 8.4 % IV SOLN
INTRAVENOUS | Status: DC | PRN
Start: 2020-05-16 — End: 2020-05-16
  Administered 2020-05-16: 50 meq via INTRAVENOUS

## 2020-05-16 MED ORDER — PROTAMINE SULFATE 10 MG/ML IV SOLN
INTRAVENOUS | Status: DC | PRN
  Administered 2020-05-16: 10 mg via INTRAVENOUS
  Administered 2020-05-16: 370 mg via INTRAVENOUS
  Administered 2020-05-16: 10 mg via INTRAVENOUS

## 2020-05-16 MED ORDER — ACETAMINOPHEN 160 MG/5ML PO SOLN: 650.0000 mg | Freq: Once | ORAL | Status: DC

## 2020-05-16 MED ORDER — SODIUM CHLORIDE 0.9 % IV SOLN: 250.0000 mL | INTRAVENOUS | Status: DC

## 2020-05-16 MED ORDER — CHLORHEXIDINE GLUCONATE CLOTH 2 % EX PADS
6.0000 | MEDICATED_PAD | Freq: Every day | CUTANEOUS | Status: DC
  Administered 2020-05-17 – 2020-05-27 (×10): 6 via TOPICAL

## 2020-05-16 MED ORDER — SODIUM CHLORIDE 0.9 % IV SOLN
20.0000 ug | Freq: Once | INTRAVENOUS | Status: AC
  Administered 2020-05-16: 20 ug via INTRAVENOUS
  Filled 2020-05-16: qty 5

## 2020-05-16 MED ORDER — FAMOTIDINE IN NACL 20-0.9 MG/50ML-% IV SOLN
20.0000 mg | Freq: Two times a day (BID) | INTRAVENOUS | Status: AC
  Administered 2020-05-16 (×2): 20 mg via INTRAVENOUS
  Filled 2020-05-16: qty 50

## 2020-05-16 MED ORDER — 0.9 % SODIUM CHLORIDE (POUR BTL) OPTIME
TOPICAL | Status: DC | PRN
  Administered 2020-05-16: 2000 mL
  Administered 2020-05-16: 4000 mL

## 2020-05-16 MED ORDER — TRAMADOL HCL 50 MG PO TABS
50.0000 mg | ORAL_TABLET | ORAL | Status: DC | PRN
  Administered 2020-05-19 – 2020-05-21 (×4): 50 mg via ORAL
  Filled 2020-05-16: qty 2
  Filled 2020-05-16 (×3): qty 1

## 2020-05-16 MED ORDER — TRANEXAMIC ACID 1000 MG/10ML IV SOLN
1.5000 mg/kg/h | INTRAVENOUS | Status: DC
  Filled 2020-05-16: qty 25

## 2020-05-16 MED ORDER — VANCOMYCIN HCL 1000 MG IV SOLR
INTRAVENOUS | Status: AC
  Filled 2020-05-16: qty 3000

## 2020-05-16 MED ORDER — PHENYLEPHRINE HCL-NACL 20-0.9 MG/250ML-% IV SOLN
0.0000 ug/min | INTRAVENOUS | Status: DC
  Administered 2020-05-16: 120 ug/min via INTRAVENOUS
  Administered 2020-05-16 – 2020-05-17 (×2): 50 ug/min via INTRAVENOUS
  Filled 2020-05-16 (×5): qty 250

## 2020-05-16 MED ORDER — SODIUM CHLORIDE 0.9% FLUSH
3.0000 mL | Freq: Two times a day (BID) | INTRAVENOUS | Status: DC
  Administered 2020-05-17 – 2020-05-21 (×8): 3 mL via INTRAVENOUS

## 2020-05-16 MED ORDER — DEXTROSE 50 % IV SOLN: 0.0000 mL | INTRAVENOUS | Status: DC | PRN

## 2020-05-16 MED ORDER — EPINEPHRINE HCL 5 MG/250ML IV SOLN IN NS
0.5000 ug/min | INTRAVENOUS | Status: DC
  Administered 2020-05-16: 6 ug/min via INTRAVENOUS
  Administered 2020-05-17: 4 ug/min via INTRAVENOUS
  Administered 2020-05-18: 3 ug/min via INTRAVENOUS
  Filled 2020-05-16 (×4): qty 250

## 2020-05-16 MED ORDER — BUPIVACAINE HCL (PF) 0.5 % IJ SOLN
INTRAMUSCULAR | Status: AC
  Filled 2020-05-16: qty 30

## 2020-05-16 MED ORDER — ACETAMINOPHEN 650 MG RE SUPP: 650.0000 mg | Freq: Once | RECTAL | Status: DC

## 2020-05-16 MED ORDER — CHLORHEXIDINE GLUCONATE 0.12 % MT SOLN
15.0000 mL | OROMUCOSAL | Status: AC
  Administered 2020-05-17: 15 mL via OROMUCOSAL

## 2020-05-16 MED ORDER — POTASSIUM CHLORIDE 10 MEQ/50ML IV SOLN: 10.0000 meq | INTRAVENOUS | Status: AC

## 2020-05-16 MED ORDER — BISACODYL 5 MG PO TBEC
10.0000 mg | DELAYED_RELEASE_TABLET | Freq: Every day | ORAL | Status: DC
  Administered 2020-05-21 – 2020-05-23 (×3): 10 mg via ORAL
  Filled 2020-05-16 (×4): qty 2

## 2020-05-16 MED ORDER — INSULIN REGULAR(HUMAN) IN NACL 100-0.9 UT/100ML-% IV SOLN
INTRAVENOUS | Status: DC
  Administered 2020-05-16: 15 [IU]/h via INTRAVENOUS
  Administered 2020-05-17: 22 [IU]/h via INTRAVENOUS
  Administered 2020-05-17: 5 [IU]/h via INTRAVENOUS
  Filled 2020-05-16 (×3): qty 100

## 2020-05-16 MED ORDER — ROCURONIUM BROMIDE 10 MG/ML (PF) SYRINGE
PREFILLED_SYRINGE | INTRAVENOUS | Status: DC | PRN
  Administered 2020-05-16 (×3): 100 mg via INTRAVENOUS
  Administered 2020-05-16: 50 mg via INTRAVENOUS

## 2020-05-16 MED ORDER — HEMOSTATIC AGENTS (NO CHARGE) OPTIME
TOPICAL | Status: DC | PRN
  Administered 2020-05-16 (×4): 1 via TOPICAL
  Administered 2020-05-16: 2 via TOPICAL

## 2020-05-16 MED ORDER — MAGNESIUM SULFATE 4 GM/100ML IV SOLN
4.0000 g | Freq: Once | INTRAVENOUS | Status: AC
  Administered 2020-05-16: 4 g via INTRAVENOUS

## 2020-05-16 MED ORDER — ARTIFICIAL TEARS OPHTHALMIC OINT
TOPICAL_OINTMENT | OPHTHALMIC | Status: DC | PRN
  Administered 2020-05-16: 1 via OPHTHALMIC

## 2020-05-16 MED ORDER — ALBUMIN HUMAN 5 % IV SOLN: INTRAVENOUS | Status: DC | PRN

## 2020-05-16 MED ORDER — PHENYLEPHRINE HCL-NACL 20-0.9 MG/250ML-% IV SOLN
INTRAVENOUS | Status: DC | PRN
  Administered 2020-05-16 (×2): 10 ug/min via INTRAVENOUS

## 2020-05-16 MED ORDER — LACTATED RINGERS IV SOLN
INTRAVENOUS | Status: DC | PRN
Start: 2020-05-16 — End: 2020-05-16

## 2020-05-16 MED ORDER — MILRINONE LACTATE IN DEXTROSE 20-5 MG/100ML-% IV SOLN
0.2500 ug/kg/min | INTRAVENOUS | Status: DC
  Administered 2020-05-16 – 2020-05-20 (×7): 0.25 ug/kg/min via INTRAVENOUS
  Filled 2020-05-16 (×7): qty 100

## 2020-05-16 MED ORDER — ASPIRIN 81 MG PO CHEW
324.0000 mg | CHEWABLE_TABLET | Freq: Every day | ORAL | Status: DC
  Administered 2020-05-18: 324 mg
  Filled 2020-05-16: qty 4

## 2020-05-16 MED ORDER — METOPROLOL TARTRATE 25 MG/10 ML ORAL SUSPENSION
12.5000 mg | Freq: Two times a day (BID) | ORAL | Status: DC
  Administered 2020-05-18: 12.5 mg
  Filled 2020-05-16: qty 5

## 2020-05-16 SURGICAL SUPPLY — 125 items
ADAPTER CARDIO PERF ANTE/RETRO (ADAPTER) ×5 IMPLANT
APPLIER CLIP 9.375 SM OPEN (CLIP) ×5
BAG DECANTER FOR FLEXI CONT (MISCELLANEOUS) ×5 IMPLANT
BENZOIN TINCTURE PRP APPL 2/3 (GAUZE/BANDAGES/DRESSINGS) ×5 IMPLANT
BLADE CLIPPER SURG (BLADE) IMPLANT
BLADE STERNUM SYSTEM 6 (BLADE) ×5 IMPLANT
BLADE SURG 15 STRL LF DISP TIS (BLADE) ×8 IMPLANT
BLADE SURG 15 STRL SS (BLADE) ×2
BNDG ELASTIC 4X5.8 VLCR STR LF (GAUZE/BANDAGES/DRESSINGS) ×5 IMPLANT
BNDG ELASTIC 6X5.8 VLCR STR LF (GAUZE/BANDAGES/DRESSINGS) ×5 IMPLANT
BNDG GAUZE ELAST 4 BULKY (GAUZE/BANDAGES/DRESSINGS) ×5 IMPLANT
CANISTER SUCT 3000ML PPV (MISCELLANEOUS) ×5 IMPLANT
CANISTER WOUNDNEG PRESSURE 500 (CANNISTER) ×5 IMPLANT
CANNULA NON VENT 22FR 12 (CANNULA) ×5 IMPLANT
CATH CPB KIT HENDRICKSON (MISCELLANEOUS) ×5 IMPLANT
CATH HEART VENT LEFT (CATHETERS) ×4 IMPLANT
CATH RETROPLEGIA CORONARY 14FR (CATHETERS) ×5 IMPLANT
CATH ROBINSON RED A/P 18FR (CATHETERS) ×15 IMPLANT
CLIP APPLIE 9.375 SM OPEN (CLIP) ×4 IMPLANT
CLIP RETRACTION 3.0MM CORONARY (MISCELLANEOUS) ×5 IMPLANT
CLIP VESOCCLUDE MED 24/CT (CLIP) IMPLANT
CLIP VESOCCLUDE SM WIDE 24/CT (CLIP) IMPLANT
CNTNR URN SCR LID CUP LEK RST (MISCELLANEOUS) ×4 IMPLANT
CONN ST 1/4X3/8  BEN (MISCELLANEOUS) ×1
CONN ST 1/4X3/8 BEN (MISCELLANEOUS) ×4 IMPLANT
CONT SPEC 4OZ STRL OR WHT (MISCELLANEOUS) ×1
COVER MAYO STAND STRL (DRAPES) ×5 IMPLANT
CUFF TOURN SGL QUICK 18X4 (TOURNIQUET CUFF) IMPLANT
CUFF TOURN SGL QUICK 24 (TOURNIQUET CUFF)
CUFF TRNQT CYL 24X4X16.5-23 (TOURNIQUET CUFF) IMPLANT
DERMABOND ADVANCED (GAUZE/BANDAGES/DRESSINGS) ×1
DERMABOND ADVANCED .7 DNX12 (GAUZE/BANDAGES/DRESSINGS) ×4 IMPLANT
DRAIN CHANNEL 28F RND 3/8 FF (WOUND CARE) ×20 IMPLANT
DRAPE CARDIOVASCULAR INCISE (DRAPES) ×1
DRAPE EXTREMITY T 121X128X90 (DISPOSABLE) IMPLANT
DRAPE HALF SHEET 40X57 (DRAPES) ×5 IMPLANT
DRAPE SLUSH/WARMER DISC (DRAPES) ×5 IMPLANT
DRAPE SRG 135X102X78XABS (DRAPES) ×4 IMPLANT
DRSG AQUACEL AG ADV 3.5X14 (GAUZE/BANDAGES/DRESSINGS) ×5 IMPLANT
ELECT BLADE 4.0 EZ CLEAN MEGAD (MISCELLANEOUS) ×5
ELECT CAUTERY BLADE 6.4 (BLADE) ×5 IMPLANT
ELECT REM PT RETURN 9FT ADLT (ELECTROSURGICAL) ×10
ELECTRODE BLDE 4.0 EZ CLN MEGD (MISCELLANEOUS) ×4 IMPLANT
ELECTRODE REM PT RTRN 9FT ADLT (ELECTROSURGICAL) ×8 IMPLANT
FELT TEFLON 1X6 (MISCELLANEOUS) ×5 IMPLANT
GAUZE SPONGE 4X4 12PLY STRL (GAUZE/BANDAGES/DRESSINGS) ×10 IMPLANT
GEL ULTRASOUND 20GR AQUASONIC (MISCELLANEOUS) IMPLANT
GLOVE BIO SURGEON STRL SZ 6 (GLOVE) ×15 IMPLANT
GLOVE BIO SURGEON STRL SZ 6.5 (GLOVE) ×10 IMPLANT
GLOVE BIO SURGEON STRL SZ7.5 (GLOVE) ×10 IMPLANT
GLOVE NEODERM STRL 7.5 LF PF (GLOVE) ×16 IMPLANT
GLOVE SURG NEODERM 7.5  LF PF (GLOVE) ×4
GOWN STRL REUS W/ TWL LRG LVL3 (GOWN DISPOSABLE) ×24 IMPLANT
GOWN STRL REUS W/TWL LRG LVL3 (GOWN DISPOSABLE) ×6
HEMOSTAT HEMOBLAST BELLOWS (HEMOSTASIS) ×5 IMPLANT
HEMOSTAT POWDER SURGIFOAM 1G (HEMOSTASIS) ×10 IMPLANT
INSERT FOGARTY XLG (MISCELLANEOUS) ×5 IMPLANT
IV ADAPTER SYR DOUBLE MALE LL (MISCELLANEOUS) ×5 IMPLANT
KIT BASIN OR (CUSTOM PROCEDURE TRAY) ×5 IMPLANT
KIT PREVENA INCISION MGT20CM45 (CANNISTER) ×5 IMPLANT
KIT SUCTION CATH 14FR (SUCTIONS) ×5 IMPLANT
KIT SUT CK MINI COMBO 4X17 (Prosthesis & Implant Heart) ×5 IMPLANT
KIT TURNOVER KIT B (KITS) ×5 IMPLANT
KIT VASOVIEW HEMOPRO 2 VH 4000 (KITS) ×5 IMPLANT
LINE VENT (MISCELLANEOUS) ×5 IMPLANT
MARKER GRAFT CORONARY BYPASS (MISCELLANEOUS) ×15 IMPLANT
NEEDLE 18GX1X1/2 (RX/OR ONLY) (NEEDLE) ×5 IMPLANT
NS IRRIG 1000ML POUR BTL (IV SOLUTION) ×25 IMPLANT
PACK E OPEN HEART (SUTURE) ×5 IMPLANT
PACK OPEN HEART (CUSTOM PROCEDURE TRAY) ×5 IMPLANT
PACK SPY-PHI (KITS) ×5 IMPLANT
PAD ARMBOARD 7.5X6 YLW CONV (MISCELLANEOUS) ×10 IMPLANT
PAD ELECT DEFIB RADIOL ZOLL (MISCELLANEOUS) ×5 IMPLANT
PENCIL BUTTON HOLSTER BLD 10FT (ELECTRODE) ×5 IMPLANT
POSITIONER HEAD DONUT 9IN (MISCELLANEOUS) ×5 IMPLANT
POWDER SURGICEL 3.0 GRAM (HEMOSTASIS) ×5 IMPLANT
PUNCH AORTIC ROTATE 4.5MM 8IN (MISCELLANEOUS) ×5 IMPLANT
SEALANT PATCH FIBRIN 2X4IN (MISCELLANEOUS) ×5 IMPLANT
SEALANT SURG COSEAL 8ML (VASCULAR PRODUCTS) ×5 IMPLANT
SET CANNULATION TOURNIQUET (MISCELLANEOUS) ×5 IMPLANT
SET CARDIOPLEGIA MPS 5001102 (MISCELLANEOUS) ×5 IMPLANT
SHEARS HARMONIC 9CM CVD (BLADE) IMPLANT
SPONGE LAP 18X18 RF (DISPOSABLE) ×5 IMPLANT
SPONGE LAP 18X18 X RAY DECT (DISPOSABLE) ×5 IMPLANT
SUPPORT HEART JANKE-BARRON (MISCELLANEOUS) ×5 IMPLANT
SUT BONE WAX W31G (SUTURE) ×5 IMPLANT
SUT ETHIBON 2 0 V 52N 30 (SUTURE) ×5 IMPLANT
SUT MNCRL AB 3-0 PS2 18 (SUTURE) ×10 IMPLANT
SUT PDS AB 1 CTX 36 (SUTURE) ×10 IMPLANT
SUT PROLENE 3 0 SH DA (SUTURE) ×20 IMPLANT
SUT PROLENE 4 0 RB 1 (SUTURE) ×3
SUT PROLENE 4 0 SH DA (SUTURE) ×10 IMPLANT
SUT PROLENE 4-0 RB1 .5 CRCL 36 (SUTURE) ×12 IMPLANT
SUT PROLENE 5 0 C 1 36 (SUTURE) IMPLANT
SUT PROLENE 6 0 C 1 30 (SUTURE) ×15 IMPLANT
SUT PROLENE 8 0 BV175 6 (SUTURE) ×10 IMPLANT
SUT PROLENE BLUE 7 0 (SUTURE) ×5 IMPLANT
SUT SILK  1 MH (SUTURE) ×2
SUT SILK 1 MH (SUTURE) ×8 IMPLANT
SUT SILK 2 0 SH CR/8 (SUTURE) IMPLANT
SUT SILK 3 0 SH CR/8 (SUTURE) IMPLANT
SUT STEEL 6MS V (SUTURE) ×5 IMPLANT
SUT STEEL SZ 6 DBL 3X14 BALL (SUTURE) ×5 IMPLANT
SUT VIC AB 2-0 CT1 27 (SUTURE) ×1
SUT VIC AB 2-0 CT1 TAPERPNT 27 (SUTURE) ×4 IMPLANT
SUT VIC AB 2-0 CTX 27 (SUTURE) IMPLANT
SUT VIC AB 3-0 SH 27 (SUTURE)
SUT VIC AB 3-0 SH 27X BRD (SUTURE) IMPLANT
SUT VIC AB 3-0 X1 27 (SUTURE) IMPLANT
SYR 10ML LL (SYRINGE) ×5 IMPLANT
SYR 30ML LL (SYRINGE) ×5 IMPLANT
SYR 3ML LL SCALE MARK (SYRINGE) ×5 IMPLANT
SYR 50ML SLIP (SYRINGE) IMPLANT
SYSTEM SAHARA CHEST DRAIN ATS (WOUND CARE) ×10 IMPLANT
TAPE CLOTH SURG 4X10 WHT LF (GAUZE/BANDAGES/DRESSINGS) ×10 IMPLANT
TAPE PAPER 2X10 WHT MICROPORE (GAUZE/BANDAGES/DRESSINGS) ×5 IMPLANT
TOWEL GREEN STERILE (TOWEL DISPOSABLE) ×5 IMPLANT
TOWEL GREEN STERILE FF (TOWEL DISPOSABLE) ×5 IMPLANT
TRAY FOLEY SLVR 16FR TEMP STAT (SET/KITS/TRAYS/PACK) ×5 IMPLANT
TUBING LAP HI FLOW INSUFFLATIO (TUBING) ×5 IMPLANT
UNDERPAD 30X36 HEAVY ABSORB (UNDERPADS AND DIAPERS) ×5 IMPLANT
VALVE SYSTEM EDWS INTUITY 25A (Prosthesis & Implant Heart) ×5 IMPLANT
VENT LEFT HEART 12002 (CATHETERS) ×5
WATER STERILE IRR 1000ML POUR (IV SOLUTION) ×10 IMPLANT
WATER STERILE IRR 1000ML UROMA (IV SOLUTION) ×5 IMPLANT

## 2020-05-16 NOTE — Progress Notes (Deleted)
  Echocardiogram 2D Echocardiogram has been performed.  Delcie Roch 05/16/2020, 8:53 AM

## 2020-05-16 NOTE — Anesthesia Procedure Notes (Signed)
Central Venous Catheter Insertion Performed by: Cecile Hearing, MD, anesthesiologist Start/End7/12/2019 7:00 AM, 05/16/2020 7:05 AM Patient location: Pre-op. Preanesthetic checklist: patient identified, IV checked, site marked, risks and benefits discussed, surgical consent, monitors and equipment checked, pre-op evaluation and timeout performed Position: Trendelenburg Hand hygiene performed  and maximum sterile barriers used  Total catheter length 100. PA cath was placed.Swan type:thermodilution PA Cath depth:45 Procedure performed without using ultrasound guided technique. Attempts: 1 Patient tolerated the procedure well with no immediate complications.

## 2020-05-16 NOTE — Anesthesia Postprocedure Evaluation (Signed)
Anesthesia Post Note  Patient: Eulis Manly  Procedure(s) Performed: CORONARY ARTERY BYPASS GRAFTING (CABG) X 3 (N/A Chest) AORTIC VALVE REPLACEMENT (AVR) USING 25 MM EDWARDS INTUITY ELITE VALVE (N/A ) TRANSESOPHAGEAL ECHOCARDIOGRAM (TEE) (N/A ) INDOCYANINE GREEN FLUORESCENCE IMAGING (ICG) (N/A ) ENDOVEIN HARVEST OF GREATER SAPHENOUS VEIN (Right )     Patient location during evaluation: SICU Anesthesia Type: General Level of consciousness: sedated Pain management: pain level controlled Vital Signs Assessment: post-procedure vital signs reviewed and stable Respiratory status: patient remains intubated per anesthesia plan Cardiovascular status: stable Postop Assessment: no apparent nausea or vomiting Anesthetic complications: no   No complications documented.  Last Vitals:  Vitals:   05/16/20 0445 05/16/20 1515  BP: (!) 145/87 (!) 95/52  Pulse: 80 90  Resp: 16 12  Temp: 37.2 C 36.6 C  SpO2: 100% 100%    Last Pain:  Vitals:   05/16/20 1515  TempSrc: Core  PainSc:                  Shelton Silvas

## 2020-05-16 NOTE — Brief Op Note (Signed)
05/15/2020 - 05/16/2020  12:53 PM  PATIENT:  Sean Hubbard  79 y.o. male  PRE-OPERATIVE DIAGNOSIS:  CAD SEVERE AS  POST-OPERATIVE DIAGNOSIS:  CAD SEVERE AS  PROCEDURE:  Procedure(s) with comments: CORONARY ARTERY BYPASS GRAFTING (CABG) X 4 (N/A) - BILATERAL IMA AORTIC VALVE REPLACEMENT (AVR) (N/A) TRANSESOPHAGEAL ECHOCARDIOGRAM (TEE) (N/A) INDOCYANINE GREEN FLUORESCENCE IMAGING (ICG) (N/A) ENDOVEIN HARVEST OF GREATER SAPHENOUS VEIN (Right)  SURGEON:  Surgeon(s) and Role:    * Linden Dolin, MD - Primary  PHYSICIAN ASSISTANT: WAYNE GOLD PA-C  ASSISTANTS: STAFF   ANESTHESIA:   general  EBL: PER ANES  BLOOD ADMINISTERED:none  DRAINS: PLUERAL AND MEDIASTINAL CHEST TUBES   LOCAL MEDICATIONS USED:  OTHER EXPAREL  SPECIMEN:  Source of Specimen:  AORTIC VALVE LEAFLETS  DISPOSITION OF SPECIMEN:  PATHOLOGY  COUNTS:  YES  TOURNIQUET:  * No tourniquets in log *  DICTATION: .Dragon Dictation  PLAN OF CARE: Admit to inpatient   PATIENT DISPOSITION:  ICU - intubated and hemodynamically stable.   Delay start of Pharmacological VTE agent (>24hrs) due to surgical blood loss or risk of bleeding: yes

## 2020-05-16 NOTE — Progress Notes (Signed)
Report given to nurse for surgery (pre-op).  Went with patient and transporter down to preop area and met with nurse assuming care of patient.  Patient on monitor during transport and brought monitor back up.  Patient's belongings at the charge desk at this time.  Patient's wedding band added to the belongings bag - put in a pink dentures cup with patient sticker on it.

## 2020-05-16 NOTE — Anesthesia Procedure Notes (Signed)
Central Venous Catheter Insertion Performed by: Catalina Gravel, MD, anesthesiologist Start/End7/12/2019 6:50 AM, 05/16/2020 7:00 AM Patient location: Pre-op. Preanesthetic checklist: patient identified, IV checked, site marked, risks and benefits discussed, surgical consent, monitors and equipment checked, pre-op evaluation, timeout performed and anesthesia consent Position: Trendelenburg Lidocaine 1% used for infiltration and patient sedated Hand hygiene performed , maximum sterile barriers used  and Seldinger technique used Catheter size: 9 Fr Central line was placed.MAC introducer Procedure performed using ultrasound guided technique. Ultrasound Notes:anatomy identified, needle tip was noted to be adjacent to the nerve/plexus identified, no ultrasound evidence of intravascular and/or intraneural injection and image(s) printed for medical record Attempts: 1 Following insertion, line sutured, dressing applied and Biopatch. Post procedure assessment: free fluid flow, blood return through all ports and no air  Patient tolerated the procedure well with no immediate complications.

## 2020-05-16 NOTE — Transfer of Care (Signed)
Immediate Anesthesia Transfer of Care Note  Patient: Sean Hubbard  Procedure(s) Performed: CORONARY ARTERY BYPASS GRAFTING (CABG) X 3 (N/A Chest) AORTIC VALVE REPLACEMENT (AVR) USING 25 MM EDWARDS INTUITY ELITE VALVE (N/A ) TRANSESOPHAGEAL ECHOCARDIOGRAM (TEE) (N/A ) INDOCYANINE GREEN FLUORESCENCE IMAGING (ICG) (N/A ) ENDOVEIN HARVEST OF GREATER SAPHENOUS VEIN (Right )  Patient Location: ICU  Anesthesia Type:General  Level of Consciousness: sedated and Patient remains intubated per anesthesia plan  Airway & Oxygen Therapy: Patient remains intubated per anesthesia plan and Patient placed on Ventilator (see vital sign flow sheet for setting)  Post-op Assessment: Report given to RN and Post -op Vital signs reviewed and stable  Post vital signs: Reviewed and stable  Last Vitals:  Vitals Value Taken Time  BP 95/52 05/16/20 1515  Temp 36.7 C 05/16/20 1530  Pulse 89 05/16/20 1530  Resp 26 05/16/20 1530  SpO2 100 % 05/16/20 1530  Vitals shown include unvalidated device data.  Last Pain:  Vitals:   05/16/20 0445  TempSrc: Oral  PainSc:          Complications: No complications documented.

## 2020-05-16 NOTE — Progress Notes (Signed)
  Echocardiogram Echocardiogram Transesophageal has been performed.  Delcie Roch 05/16/2020, 8:58 AM

## 2020-05-16 NOTE — H&P (Signed)
History and Physical Interval Note:  05/16/2020 7:19 AM  Sean Hubbard  has presented today for surgery, with the diagnosis of CAD SEVERE AS.  The various methods of treatment have been discussed with the patient and family. After consideration of risks, benefits and other options for treatment, the patient has consented to  Procedure(s) with comments: CORONARY ARTERY BYPASS GRAFTING (CABG) (N/A) - BILATERAL IMA AORTIC VALVE REPLACEMENT (AVR) (N/A) RADIAL ARTERY HARVEST (Left) TRANSESOPHAGEAL ECHOCARDIOGRAM (TEE) (N/A) INDOCYANINE GREEN FLUORESCENCE IMAGING (ICG) (N/A) as a surgical intervention.  The patient's history has been reviewed, patient examined, no change in status, stable for surgery.  I have reviewed the patient's chart and labs.  Questions were answered to the patient's satisfaction.     Linden Dolin

## 2020-05-16 NOTE — Progress Notes (Signed)
CT surgery p.m. Rounds  Patient sedated on ventilator Chest tube output initially significant secondary to coagulopathy now improving Urine output adequate Mild metabolic acidosis We will not wean ventilator until a.m. and follow serial hemoglobin Blood pressure (!) 87/73, pulse 89, temperature 97.7 F (36.5 C), resp. rate 18, height 5' 8.5" (1.74 m), weight 112.2 kg, SpO2 100 %.

## 2020-05-16 NOTE — Anesthesia Preprocedure Evaluation (Addendum)
Anesthesia Evaluation  Patient identified by MRN, date of birth, ID band Patient awake    Reviewed: Allergy & Precautions, NPO status , Patient's Chart, lab work & pertinent test results  Airway Mallampati: III  TM Distance: >3 FB Neck ROM: Full    Dental  (+) Teeth Intact, Dental Advisory Given   Pulmonary Current Smoker and Patient abstained from smoking.,    breath sounds clear to auscultation       Cardiovascular hypertension, + CAD and + Cardiac Stents  + Valvular Problems/Murmurs AS  Rhythm:Regular Rate:Normal     Neuro/Psych negative neurological ROS  negative psych ROS   GI/Hepatic negative GI ROS, Neg liver ROS,   Endo/Other  negative endocrine ROS  Renal/GU negative Renal ROS  negative genitourinary   Musculoskeletal negative musculoskeletal ROS (+)   Abdominal Normal abdominal exam  (+)   Peds  Hematology negative hematology ROS (+)   Anesthesia Other Findings   Reproductive/Obstetrics                            Anesthesia Physical Anesthesia Plan  ASA: IV  Anesthesia Plan: General   Post-op Pain Management:    Induction: Intravenous  PONV Risk Score and Plan: 2 and Ondansetron  Airway Management Planned: Oral ETT  Additional Equipment: Arterial line, CVP, PA Cath, TEE and Ultrasound Guidance Line Placement  Intra-op Plan:   Post-operative Plan: Post-operative intubation/ventilation  Informed Consent: I have reviewed the patients History and Physical, chart, labs and discussed the procedure including the risks, benefits and alternatives for the proposed anesthesia with the patient or authorized representative who has indicated his/her understanding and acceptance.     Dental advisory given  Plan Discussed with: CRNA  Anesthesia Plan Comments: (Lab Results      Component                Value               Date                      WBC                      6.1                  05/16/2020                HGB                      15.3                05/16/2020                HCT                      47.0                05/16/2020                MCV                      93.8                05/16/2020                PLT  143 (L)             05/16/2020             Echo: 1. Left ventricular ejection fraction, by estimation, is 55 to 60%. The  left ventricle has normal function. Left ventricular endocardial border  not optimally defined to evaluate regional wall motion. Left ventricular  diastolic parameters are consistent  with Grade I diastolic dysfunction (impaired relaxation).  2. Right ventricular systolic function is normal. The right ventricular  size is normal. Tricuspid regurgitation signal is inadequate for assessing  PA pressure.  3. Left atrial size was moderately dilated.  4. Right atrial size was mildly dilated.  5. The mitral valve is normal in structure. Trivial mitral valve  regurgitation. No evidence of mitral stenosis.  6. The aortic valve is abnormal. Aortic valve regurgitation is not  visualized. Moderate to severe aortic valve stenosis. Aortic valve area,  by VTI measures 1.01 cm. Aortic valve mean gradient measures 37.0 mmHg. )       Anesthesia Quick Evaluation

## 2020-05-17 ENCOUNTER — Inpatient Hospital Stay (HOSPITAL_COMMUNITY): Payer: Medicare Other

## 2020-05-17 DIAGNOSIS — I314 Cardiac tamponade: Secondary | ICD-10-CM

## 2020-05-17 LAB — COMPREHENSIVE METABOLIC PANEL
ALT: 1273 U/L — ABNORMAL HIGH (ref 0–44)
AST: 2239 U/L — ABNORMAL HIGH (ref 15–41)
Albumin: 3.2 g/dL — ABNORMAL LOW (ref 3.5–5.0)
Alkaline Phosphatase: 32 U/L — ABNORMAL LOW (ref 38–126)
Anion gap: 9 (ref 5–15)
BUN: 18 mg/dL (ref 8–23)
CO2: 23 mmol/L (ref 22–32)
Calcium: 7.8 mg/dL — ABNORMAL LOW (ref 8.9–10.3)
Chloride: 111 mmol/L (ref 98–111)
Creatinine, Ser: 1.44 mg/dL — ABNORMAL HIGH (ref 0.61–1.24)
GFR calc Af Amer: 54 mL/min — ABNORMAL LOW (ref >60)
GFR calc non Af Amer: 47 mL/min — ABNORMAL LOW (ref >60)
Glucose, Bld: 140 mg/dL — ABNORMAL HIGH (ref 70–99)
Potassium: 3.9 mmol/L (ref 3.5–5.1)
Sodium: 143 mmol/L (ref 135–145)
Total Bilirubin: 1.8 mg/dL — ABNORMAL HIGH (ref 0.3–1.2)
Total Protein: 4.6 g/dL — ABNORMAL LOW (ref 6.5–8.1)

## 2020-05-17 LAB — PROTIME-INR
INR: 1.8 — ABNORMAL HIGH (ref 0.8–1.2)
Prothrombin Time: 20.1 seconds — ABNORMAL HIGH (ref 11.4–15.2)

## 2020-05-17 LAB — POCT I-STAT 7, (LYTES, BLD GAS, ICA,H+H)
Acid-Base Excess: 2 mmol/L (ref 0.0–2.0)
Acid-base deficit: 5 mmol/L — ABNORMAL HIGH (ref 0.0–2.0)
Acid-base deficit: 2 mmol/L (ref 0.0–2.0)
Acid-base deficit: 5 mmol/L — ABNORMAL HIGH (ref 0.0–2.0)
Bicarbonate: 21.1 mmol/L (ref 20.0–28.0)
Bicarbonate: 23.2 mmol/L (ref 20.0–28.0)
Bicarbonate: 25.9 mmol/L (ref 20.0–28.0)
Bicarbonate: 20.9 mmol/L (ref 20.0–28.0)
Calcium, Ion: 1.23 mmol/L (ref 1.15–1.40)
Calcium, Ion: 1.17 mmol/L (ref 1.15–1.40)
Calcium, Ion: 1.15 mmol/L (ref 1.15–1.40)
Calcium, Ion: 1.11 mmol/L — ABNORMAL LOW (ref 1.15–1.40)
HCT: 18 % — ABNORMAL LOW (ref 39.0–52.0)
HCT: 16 % — ABNORMAL LOW (ref 39.0–52.0)
HCT: 30 % — ABNORMAL LOW (ref 39.0–52.0)
HCT: 22 % — ABNORMAL LOW (ref 39.0–52.0)
Hemoglobin: 6.1 g/dL — CL (ref 13.0–17.0)
Hemoglobin: 5.4 g/dL — CL (ref 13.0–17.0)
Hemoglobin: 10.2 g/dL — ABNORMAL LOW (ref 13.0–17.0)
Hemoglobin: 7.5 g/dL — ABNORMAL LOW (ref 13.0–17.0)
O2 Saturation: 99 %
O2 Saturation: 99 %
O2 Saturation: 97 %
O2 Saturation: 97 %
Patient temperature: 36.8
Patient temperature: 37.1
Patient temperature: 36.9
Potassium: 3.9 mmol/L (ref 3.5–5.1)
Potassium: 4 mmol/L (ref 3.5–5.1)
Potassium: 4.1 mmol/L (ref 3.5–5.1)
Potassium: 3.8 mmol/L (ref 3.5–5.1)
Sodium: 147 mmol/L — ABNORMAL HIGH (ref 135–145)
Sodium: 146 mmol/L — ABNORMAL HIGH (ref 135–145)
Sodium: 146 mmol/L — ABNORMAL HIGH (ref 135–145)
Sodium: 146 mmol/L — ABNORMAL HIGH (ref 135–145)
TCO2: 22 mmol/L (ref 22–32)
TCO2: 24 mmol/L (ref 22–32)
TCO2: 27 mmol/L (ref 22–32)
TCO2: 22 mmol/L (ref 22–32)
pCO2 arterial: 43.9 mmHg (ref 32.0–48.0)
pCO2 arterial: 41 mmHg (ref 32.0–48.0)
pCO2 arterial: 39 mmHg (ref 32.0–48.0)
pCO2 arterial: 39 mmHg (ref 32.0–48.0)
pH, Arterial: 7.288 — ABNORMAL LOW (ref 7.350–7.450)
pH, Arterial: 7.361 (ref 7.350–7.450)
pH, Arterial: 7.431 (ref 7.350–7.450)
pH, Arterial: 7.335 — ABNORMAL LOW (ref 7.350–7.450)
pO2, Arterial: 129 mmHg — ABNORMAL HIGH (ref 83.0–108.0)
pO2, Arterial: 142 mmHg — ABNORMAL HIGH (ref 83.0–108.0)
pO2, Arterial: 93 mmHg (ref 83.0–108.0)
pO2, Arterial: 99 mmHg (ref 83.0–108.0)

## 2020-05-17 LAB — BASIC METABOLIC PANEL
Anion gap: 10 (ref 5–15)
Anion gap: 11 (ref 5–15)
BUN: 14 mg/dL (ref 8–23)
BUN: 16 mg/dL (ref 8–23)
CO2: 22 mmol/L (ref 22–32)
CO2: 20 mmol/L — ABNORMAL LOW (ref 22–32)
Calcium: 8.1 mg/dL — ABNORMAL LOW (ref 8.9–10.3)
Calcium: 7.8 mg/dL — ABNORMAL LOW (ref 8.9–10.3)
Chloride: 112 mmol/L — ABNORMAL HIGH (ref 98–111)
Chloride: 112 mmol/L — ABNORMAL HIGH (ref 98–111)
Creatinine, Ser: 1.32 mg/dL — ABNORMAL HIGH (ref 0.61–1.24)
Creatinine, Ser: 1.33 mg/dL — ABNORMAL HIGH (ref 0.61–1.24)
GFR calc Af Amer: >60 mL/min (ref >60)
GFR calc Af Amer: 60 mL/min — ABNORMAL LOW (ref >60)
GFR calc non Af Amer: 52 mL/min — ABNORMAL LOW (ref >60)
GFR calc non Af Amer: 52 mL/min — ABNORMAL LOW (ref >60)
Glucose, Bld: 118 mg/dL — ABNORMAL HIGH (ref 70–99)
Glucose, Bld: 102 mg/dL — ABNORMAL HIGH (ref 70–99)
Potassium: 4 mmol/L (ref 3.5–5.1)
Potassium: 4 mmol/L (ref 3.5–5.1)
Sodium: 144 mmol/L (ref 135–145)
Sodium: 143 mmol/L (ref 135–145)

## 2020-05-17 LAB — GLUCOSE, CAPILLARY
Glucose-Capillary: 102 mg/dL — ABNORMAL HIGH (ref 70–99)
Glucose-Capillary: 106 mg/dL — ABNORMAL HIGH (ref 70–99)
Glucose-Capillary: 113 mg/dL — ABNORMAL HIGH (ref 70–99)
Glucose-Capillary: 115 mg/dL — ABNORMAL HIGH (ref 70–99)
Glucose-Capillary: 115 mg/dL — ABNORMAL HIGH (ref 70–99)
Glucose-Capillary: 116 mg/dL — ABNORMAL HIGH (ref 70–99)
Glucose-Capillary: 119 mg/dL — ABNORMAL HIGH (ref 70–99)
Glucose-Capillary: 120 mg/dL — ABNORMAL HIGH (ref 70–99)
Glucose-Capillary: 126 mg/dL — ABNORMAL HIGH (ref 70–99)
Glucose-Capillary: 130 mg/dL — ABNORMAL HIGH (ref 70–99)
Glucose-Capillary: 133 mg/dL — ABNORMAL HIGH (ref 70–99)
Glucose-Capillary: 137 mg/dL — ABNORMAL HIGH (ref 70–99)
Glucose-Capillary: 141 mg/dL — ABNORMAL HIGH (ref 70–99)
Glucose-Capillary: 145 mg/dL — ABNORMAL HIGH (ref 70–99)
Glucose-Capillary: 173 mg/dL — ABNORMAL HIGH (ref 70–99)
Glucose-Capillary: 200 mg/dL — ABNORMAL HIGH (ref 70–99)
Glucose-Capillary: 71 mg/dL (ref 70–99)

## 2020-05-17 LAB — PREPARE FRESH FROZEN PLASMA
Unit division: 0
Unit division: 0
Unit division: 0
Unit division: 0

## 2020-05-17 LAB — CBC
HCT: 19.2 % — ABNORMAL LOW (ref 39.0–52.0)
HCT: 21 % — ABNORMAL LOW (ref 39.0–52.0)
HCT: 22.7 % — ABNORMAL LOW (ref 39.0–52.0)
HCT: 28.1 % — ABNORMAL LOW (ref 39.0–52.0)
HCT: 28.9 % — ABNORMAL LOW (ref 39.0–52.0)
HCT: 30.2 % — ABNORMAL LOW (ref 39.0–52.0)
Hemoglobin: 10 g/dL — ABNORMAL LOW (ref 13.0–17.0)
Hemoglobin: 6.5 g/dL — CL (ref 13.0–17.0)
Hemoglobin: 6.6 g/dL — CL (ref 13.0–17.0)
Hemoglobin: 7.3 g/dL — ABNORMAL LOW (ref 13.0–17.0)
Hemoglobin: 9.3 g/dL — ABNORMAL LOW (ref 13.0–17.0)
Hemoglobin: 9.9 g/dL — ABNORMAL LOW (ref 13.0–17.0)
MCH: 28.2 pg (ref 26.0–34.0)
MCH: 29.1 pg (ref 26.0–34.0)
MCH: 29.2 pg (ref 26.0–34.0)
MCH: 29.4 pg (ref 26.0–34.0)
MCH: 29.6 pg (ref 26.0–34.0)
MCH: 29.7 pg (ref 26.0–34.0)
MCHC: 31.4 g/dL (ref 30.0–36.0)
MCHC: 32.2 g/dL (ref 30.0–36.0)
MCHC: 33.1 g/dL (ref 30.0–36.0)
MCHC: 33.1 g/dL (ref 30.0–36.0)
MCHC: 33.9 g/dL (ref 30.0–36.0)
MCHC: 34.3 g/dL (ref 30.0–36.0)
MCV: 86.8 fL (ref 80.0–100.0)
MCV: 86.9 fL (ref 80.0–100.0)
MCV: 87.8 fL (ref 80.0–100.0)
MCV: 88 fL (ref 80.0–100.0)
MCV: 89.7 fL (ref 80.0–100.0)
MCV: 91.9 fL (ref 80.0–100.0)
Platelets: 116 10*3/uL — ABNORMAL LOW (ref 150–400)
Platelets: 154 10*3/uL (ref 150–400)
Platelets: 63 10*3/uL — ABNORMAL LOW (ref 150–400)
Platelets: 69 10*3/uL — ABNORMAL LOW (ref 150–400)
Platelets: 69 10*3/uL — ABNORMAL LOW (ref 150–400)
Platelets: 72 10*3/uL — ABNORMAL LOW (ref 150–400)
RBC: 2.21 MIL/uL — ABNORMAL LOW (ref 4.22–5.81)
RBC: 2.34 MIL/uL — ABNORMAL LOW (ref 4.22–5.81)
RBC: 2.47 MIL/uL — ABNORMAL LOW (ref 4.22–5.81)
RBC: 3.2 MIL/uL — ABNORMAL LOW (ref 4.22–5.81)
RBC: 3.33 MIL/uL — ABNORMAL LOW (ref 4.22–5.81)
RBC: 3.43 MIL/uL — ABNORMAL LOW (ref 4.22–5.81)
RDW: 15.1 % (ref 11.5–15.5)
RDW: 15.2 % (ref 11.5–15.5)
RDW: 15.9 % — ABNORMAL HIGH (ref 11.5–15.5)
RDW: 17.3 % — ABNORMAL HIGH (ref 11.5–15.5)
RDW: 17.5 % — ABNORMAL HIGH (ref 11.5–15.5)
RDW: 18 % — ABNORMAL HIGH (ref 11.5–15.5)
WBC: 6.9 10*3/uL (ref 4.0–10.5)
WBC: 7.2 10*3/uL (ref 4.0–10.5)
WBC: 7.3 10*3/uL (ref 4.0–10.5)
WBC: 8.1 10*3/uL (ref 4.0–10.5)
WBC: 8.4 10*3/uL (ref 4.0–10.5)
WBC: 8.9 10*3/uL (ref 4.0–10.5)
nRBC: 0 % (ref 0.0–0.2)
nRBC: 0 % (ref 0.0–0.2)
nRBC: 0 % (ref 0.0–0.2)
nRBC: 0 % (ref 0.0–0.2)
nRBC: 0 % (ref 0.0–0.2)
nRBC: 0 % (ref 0.0–0.2)

## 2020-05-17 LAB — COOXEMETRY PANEL
Carboxyhemoglobin: 1.2 % (ref 0.5–1.5)
Carboxyhemoglobin: 1.5 % (ref 0.5–1.5)
Methemoglobin: 1.2 % (ref 0.0–1.5)
Methemoglobin: 1.3 % (ref 0.0–1.5)
O2 Saturation: 54.5 %
O2 Saturation: 87.7 %
Total hemoglobin: 10.2 g/dL — ABNORMAL LOW (ref 12.0–16.0)
Total hemoglobin: 10.3 g/dL — ABNORMAL LOW (ref 12.0–16.0)

## 2020-05-17 LAB — PREPARE PLATELET PHERESIS: Unit division: 0

## 2020-05-17 LAB — PREPARE CRYOPRECIPITATE: Unit division: 0

## 2020-05-17 LAB — MAGNESIUM
Magnesium: 2.4 mg/dL (ref 1.7–2.4)
Magnesium: 2.2 mg/dL (ref 1.7–2.4)

## 2020-05-17 LAB — BPAM FFP
Blood Product Expiration Date: 202107072359
Blood Product Expiration Date: 202107072359
Blood Product Expiration Date: 202107072359
Blood Product Expiration Date: 202107072359
ISSUE DATE / TIME: 202107021501
ISSUE DATE / TIME: 202107021501
ISSUE DATE / TIME: 202107021720
ISSUE DATE / TIME: 202107021720
Unit Type and Rh: 600
Unit Type and Rh: 6200
Unit Type and Rh: 600
Unit Type and Rh: 6200

## 2020-05-17 LAB — BPAM CRYOPRECIPITATE
Blood Product Expiration Date: 202107022107
ISSUE DATE / TIME: 202107021533
Unit Type and Rh: 5100

## 2020-05-17 LAB — BPAM PLATELET PHERESIS
Blood Product Expiration Date: 202107032359
ISSUE DATE / TIME: 202107021459
Unit Type and Rh: 6200

## 2020-05-17 LAB — ECHOCARDIOGRAM LIMITED
Height: 68.5 in
Weight: 4426.84 oz

## 2020-05-17 LAB — PREPARE RBC (CROSSMATCH)

## 2020-05-17 LAB — DIC (DISSEMINATED INTRAVASCULAR COAGULATION)PANEL
D-Dimer, Quant: 0.27 ug/mL-FEU (ref 0.00–0.50)
Fibrinogen: 275 mg/dL (ref 210–475)
INR: 2 — ABNORMAL HIGH (ref 0.8–1.2)
Platelets: 148 10*3/uL — ABNORMAL LOW (ref 150–400)
Prothrombin Time: 22.3 seconds — ABNORMAL HIGH (ref 11.4–15.2)
Smear Review: NONE SEEN
aPTT: 40 seconds — ABNORMAL HIGH (ref 24–36)

## 2020-05-17 MED ORDER — SODIUM CHLORIDE 0.9% IV SOLUTION: Freq: Once | INTRAVENOUS | Status: DC

## 2020-05-17 MED ORDER — CALCIUM GLUCONATE-NACL 1-0.675 GM/50ML-% IV SOLN
1.0000 g | Freq: Once | INTRAVENOUS | Status: AC
  Administered 2020-05-17: 1000 mg via INTRAVENOUS
  Filled 2020-05-17: qty 50

## 2020-05-17 MED ORDER — CHLORHEXIDINE GLUCONATE 0.12% ORAL RINSE (MEDLINE KIT)
15.0000 mL | Freq: Two times a day (BID) | OROMUCOSAL | Status: DC
  Administered 2020-05-17 – 2020-05-18 (×2): 15 mL via OROMUCOSAL

## 2020-05-17 MED ORDER — THIAMINE HCL 100 MG/ML IJ SOLN
Freq: Once | INTRAVENOUS | Status: AC
  Filled 2020-05-17: qty 1000

## 2020-05-17 MED ORDER — ACETAMINOPHEN 10 MG/ML IV SOLN
1000.0000 mg | Freq: Four times a day (QID) | INTRAVENOUS | Status: DC
  Administered 2020-05-17: 1000 mg via INTRAVENOUS
  Filled 2020-05-17 (×4): qty 100

## 2020-05-17 MED ORDER — CALCIUM GLUCONATE-NACL 1-0.675 GM/50ML-% IV SOLN
INTRAVENOUS | Status: AC
  Filled 2020-05-17: qty 50

## 2020-05-17 MED ORDER — FUROSEMIDE 10 MG/ML IJ SOLN
40.0000 mg | Freq: Once | INTRAMUSCULAR | Status: AC
  Administered 2020-05-17: 40 mg via INTRAVENOUS
  Filled 2020-05-17: qty 4

## 2020-05-17 MED ORDER — THIAMINE HCL 100 MG/ML IJ SOLN
100.0000 mg | Freq: Every day | INTRAMUSCULAR | Status: DC
  Administered 2020-05-17 – 2020-05-24 (×8): 100 mg via INTRAVENOUS
  Filled 2020-05-17 (×8): qty 2

## 2020-05-17 MED ORDER — MAGNESIUM SULFATE 2 GM/50ML IV SOLN
2.0000 g | Freq: Once | INTRAVENOUS | Status: AC
  Administered 2020-05-17: 2 g via INTRAVENOUS
  Filled 2020-05-17: qty 50

## 2020-05-17 MED ORDER — ALBUMIN HUMAN 5 % IV SOLN
INTRAVENOUS | Status: AC
  Administered 2020-05-17: 25 g via INTRAVENOUS
  Filled 2020-05-17: qty 500

## 2020-05-17 MED ORDER — INSULIN DETEMIR 100 UNIT/ML ~~LOC~~ SOLN
10.0000 [IU] | Freq: Two times a day (BID) | SUBCUTANEOUS | Status: DC
  Administered 2020-05-17 – 2020-05-24 (×14): 10 [IU] via SUBCUTANEOUS
  Filled 2020-05-17 (×15): qty 0.1

## 2020-05-17 MED ORDER — LIDOCAINE HCL (PF) 1 % IJ SOLN
INTRAMUSCULAR | Status: AC
  Administered 2020-05-17: 30 mL
  Filled 2020-05-17: qty 30

## 2020-05-17 MED ORDER — ORAL CARE MOUTH RINSE
15.0000 mL | OROMUCOSAL | Status: DC
  Administered 2020-05-17 – 2020-05-18 (×11): 15 mL via OROMUCOSAL

## 2020-05-17 MED ORDER — SODIUM CHLORIDE 0.9 % IV SOLN
20.0000 ug | Freq: Once | INTRAVENOUS | Status: AC
  Administered 2020-05-17: 20 ug via INTRAVENOUS
  Filled 2020-05-17: qty 5

## 2020-05-17 MED ORDER — FENTANYL 2500MCG IN NS 250ML (10MCG/ML) PREMIX INFUSION
0.0000 ug/h | INTRAVENOUS | Status: DC
  Administered 2020-05-17: 25 ug/h via INTRAVENOUS
  Filled 2020-05-17: qty 250

## 2020-05-17 MED ORDER — INSULIN ASPART 100 UNIT/ML ~~LOC~~ SOLN
0.0000 [IU] | SUBCUTANEOUS | Status: DC
  Administered 2020-05-17 – 2020-05-18 (×2): 2 [IU] via SUBCUTANEOUS
  Administered 2020-05-18 (×4): 3 [IU] via SUBCUTANEOUS
  Administered 2020-05-19 (×6): 2 [IU] via SUBCUTANEOUS
  Administered 2020-05-20: 3 [IU] via SUBCUTANEOUS
  Administered 2020-05-20 (×2): 2 [IU] via SUBCUTANEOUS
  Administered 2020-05-21: 10 [IU] via SUBCUTANEOUS
  Administered 2020-05-21: 2 [IU] via SUBCUTANEOUS

## 2020-05-17 MED ORDER — SODIUM CHLORIDE 0.9% IV SOLUTION: Freq: Once | INTRAVENOUS | Status: AC

## 2020-05-17 MED ORDER — ALBUMIN HUMAN 5 % IV SOLN: 25.0000 g | Freq: Once | INTRAVENOUS | Status: AC

## 2020-05-17 MED ORDER — FUROSEMIDE 10 MG/ML IJ SOLN
8.0000 mg/h | INTRAVENOUS | Status: DC
  Filled 2020-05-17: qty 25

## 2020-05-17 MED ORDER — NOREPINEPHRINE 16 MG/250ML-% IV SOLN
2.0000 ug/min | INTRAVENOUS | Status: DC
  Administered 2020-05-17: 30 ug/min via INTRAVENOUS
  Filled 2020-05-17: qty 250

## 2020-05-17 MED ORDER — PLASMA-LYTE A IV SOLN: INTRAVENOUS | Status: DC

## 2020-05-17 MED ORDER — LIDOCAINE HCL (CARDIAC) PF 100 MG/5ML IV SOSY
PREFILLED_SYRINGE | INTRAVENOUS | Status: AC
  Filled 2020-05-17: qty 5

## 2020-05-17 MED ORDER — FENTANYL CITRATE (PF) 100 MCG/2ML IJ SOLN
INTRAMUSCULAR | Status: AC
  Administered 2020-05-17: 100 ug
  Filled 2020-05-17: qty 4

## 2020-05-17 MED ORDER — LIDOCAINE HCL (PF) 2 % IJ SOLN
20.0000 mL | Freq: Once | INTRAMUSCULAR | Status: DC
  Filled 2020-05-17: qty 20

## 2020-05-17 MED ORDER — DEXMEDETOMIDINE HCL IN NACL 400 MCG/100ML IV SOLN
0.0000 ug/kg/h | INTRAVENOUS | Status: DC
  Administered 2020-05-17 (×2): 1.2 ug/kg/h via INTRAVENOUS
  Administered 2020-05-17: 0.9 ug/kg/h via INTRAVENOUS
  Administered 2020-05-17 (×2): 1.2 ug/kg/h via INTRAVENOUS
  Administered 2020-05-18: 0.6 ug/kg/h via INTRAVENOUS
  Administered 2020-05-18: 1.2 ug/kg/h via INTRAVENOUS
  Administered 2020-05-18: 0.7 ug/kg/h via INTRAVENOUS
  Administered 2020-05-18: 1.2 ug/kg/h via INTRAVENOUS
  Filled 2020-05-17: qty 100
  Filled 2020-05-17: qty 200
  Filled 2020-05-17 (×5): qty 100

## 2020-05-17 MED ORDER — SODIUM BICARBONATE 8.4 % IV SOLN
100.0000 meq | Freq: Once | INTRAVENOUS | Status: AC
  Filled 2020-05-17: qty 50

## 2020-05-17 NOTE — Progress Notes (Signed)
Echocardiogram 2D Echocardiogram has been performed.  Warren Lacy Sean Hubbard 05/17/2020, 1:10 PM

## 2020-05-17 NOTE — Procedures (Signed)
Insertion of Chest Tube Procedure Note  Sean Hubbard  [id number]  01-23-1941  Date:05/17/20  Time:11:14 AM    Provider Performing: Linden Dolin   Procedure: Chest Tube Insertion (32551)  Indication(s) Hemothorax  Consent Unable to obtain consent due to emergent nature of procedure.  Anesthesia Topical only with 1% lidocaine    Time Out Verified patient identification, verified procedure, site/side was marked, verified correct patient position, special equipment/implants available, medications/allergies/relevant history reviewed, required imaging and test results available.   Sterile Technique Maximal sterile technique including full sterile barrier drape, hand hygiene, sterile gown, sterile gloves, mask, hair covering, sterile ultrasound probe cover (if used).   Procedure Description Ultrasound not used to identify appropriate pleural anatomy for placement and overlying skin marked. Area of placement cleaned and draped in sterile fashion.  A 32 French chest tube was placed into the right pleural space using trochar. Appropriate return of blood was obtained.  The tube was connected to atrium and placed on -20 cm H2O wall suction.   Complications/Tolerance None; patient tolerated the procedure well. Chest X-ray is ordered to verify placement.   EBL Minimal  Specimen(s) none

## 2020-05-17 NOTE — Progress Notes (Signed)
CTS PM Rounds   BP and ABG better but patient agitated and increased RR so not able to wean vent I/O balance positive 10L - lasix ordered  Blood pressure (!) 143/85, pulse 94, temperature (!) 97.3 F (36.3 C), resp. rate 18, height 5' 8.5" (1.74 m), weight 125.5 kg, SpO2 100 %.

## 2020-05-17 NOTE — Plan of Care (Signed)
  Problem: Clinical Measurements: Goal: Respiratory complications will improve Outcome: Progressing   Problem: Clinical Measurements: Goal: Cardiovascular complication will be avoided Outcome: Progressing   Problem: Clinical Measurements: Goal: Ability to maintain clinical measurements within normal limits will improve Outcome: Progressing   Problem: Pain Managment: Goal: General experience of comfort will improve Outcome: Progressing

## 2020-05-17 NOTE — Progress Notes (Signed)
1 Day Post-Op Procedure(s) (LRB): CORONARY ARTERY BYPASS GRAFTING (CABG) X 3 (N/A) AORTIC VALVE REPLACEMENT (AVR) USING 25 MM EDWARDS INTUITY ELITE VALVE (N/A) TRANSESOPHAGEAL ECHOCARDIOGRAM (TEE) (N/A) INDOCYANINE GREEN FLUORESCENCE IMAGING (ICG) (N/A) ENDOVEIN HARVEST OF GREATER SAPHENOUS VEIN (Right) Subjective: intubated Objective: Vital signs in last 24 hours: Temp:  [97.2 F (36.2 C)-99.1 F (37.3 C)] 99.1 F (37.3 C) (07/03 1015) Pulse Rate:  [73-124] 89 (07/03 1015) Cardiac Rhythm: Atrial paced (07/03 0800) Resp:  [12-26] 18 (07/03 1015) BP: (77-145)/(32-89) 108/89 (07/03 1000) SpO2:  [91 %-100 %] 96 % (07/03 1015) Arterial Line BP: (61-151)/(40-79) 121/65 (07/03 1015) FiO2 (%):  [50 %] 50 % (07/03 0735) Weight:  [125.5 kg] 125.5 kg (07/03 0500)  Hemodynamic parameters for last 24 hours: PAP: (27-46)/(14-31) 30/20 CO:  [2.9 L/min-6.4 L/min] 3.9 L/min CI:  [1.3 L/min/m2-2.9 L/min/m2] 1.8 L/min/m2  Intake/Output from previous day: 07/02 0701 - 07/03 0700 In: 14839.2 [I.V.:7201.4; SAYTK:1601; IV Piggyback:2145.9] Out: 5795 [Urine:1765; Emesis/NG output:200; Chest Tube:3830] Intake/Output this shift: Total I/O In: 1151.4 [I.V.:521.4; Blood:630] Out: 340 [Urine:70; Chest Tube:270]  General appearance: intubated, following commands Neurologic: intact Heart: regular rate and rhythm, S1, S2 normal, no murmur, click, rub or gallop Lungs: diminished breath sounds RLL Abdomen: soft, non-tender; bowel sounds normal; no masses,  no organomegaly Extremities: extremities normal, atraumatic, no cyanosis or edema Wound: dressed  Lab Results: Recent Labs    05/17/20 0522 05/17/20 0836  WBC 6.9 8.4  HGB 6.5* 9.3*  HCT 19.2* 28.1*  PLT 116* 63*   BMET:  Recent Labs    05/16/20 1955 05/17/20 0016 05/17/20 0511 05/17/20 0522  NA 140   < > 146* 144  K 4.4   < > 4.0 4.0  CL 109  --   --  112*  CO2 19*  --   --  22  GLUCOSE 216*  --   --  118*  BUN 12  --   --  14   CREATININE 0.99  --   --  1.32*  CALCIUM 7.7*  --   --  8.1*   < > = values in this interval not displayed.    PT/INR:  Recent Labs    05/17/20 0047  LABPROT 22.3*  INR 2.0*   ABG    Component Value Date/Time   PHART 7.361 05/17/2020 0511   HCO3 23.2 05/17/2020 0511   TCO2 24 05/17/2020 0511   ACIDBASEDEF 2.0 05/17/2020 0511   O2SAT 54.5 05/17/2020 1001   CBG (last 3)  Recent Labs    05/17/20 0508 05/17/20 0630 05/17/20 0809  GLUCAP 115* 119* 133*    Assessment/Plan: S/P Procedure(s) (LRB): CORONARY ARTERY BYPASS GRAFTING (CABG) X 3 (N/A) AORTIC VALVE REPLACEMENT (AVR) USING 25 MM EDWARDS INTUITY ELITE VALVE (N/A) TRANSESOPHAGEAL ECHOCARDIOGRAM (TEE) (N/A) INDOCYANINE GREEN FLUORESCENCE IMAGING (ICG) (N/A) ENDOVEIN HARVEST OF GREATER SAPHENOUS VEIN (Right) continue to resuscitate  Needs right pleural tube   LOS: 2 days    Linden Dolin 05/17/2020

## 2020-05-17 NOTE — Progress Notes (Signed)
Patient received a total of 4 units PBC, 1 unit of platelet, 2 FFP, 3 amps of bicarb, 3 albumins one with calcium chloride added, of  DDAVP. Chest tube output for night shift was 1680. Cardiac index per 1 hr was greater than 2.

## 2020-05-18 ENCOUNTER — Inpatient Hospital Stay (HOSPITAL_COMMUNITY): Payer: Medicare Other

## 2020-05-18 ENCOUNTER — Inpatient Hospital Stay: Payer: Self-pay

## 2020-05-18 LAB — BPAM FFP
Blood Product Expiration Date: 202107072359
Blood Product Expiration Date: 202107072359
Blood Product Expiration Date: 202107082359
Blood Product Expiration Date: 202107082359
ISSUE DATE / TIME: 202107030111
ISSUE DATE / TIME: 202107030111
ISSUE DATE / TIME: 202107031002
ISSUE DATE / TIME: 202107031002
Unit Type and Rh: 600
Unit Type and Rh: 6200
Unit Type and Rh: 6200
Unit Type and Rh: 6200

## 2020-05-18 LAB — TYPE AND SCREEN
ABO/RH(D): O POS
Antibody Screen: NEGATIVE
Unit division: 0
Unit division: 0
Unit division: 0
Unit division: 0
Unit division: 0
Unit division: 0
Unit division: 0
Unit division: 0
Unit division: 0
Unit division: 0

## 2020-05-18 LAB — PREPARE FRESH FROZEN PLASMA
Unit division: 0
Unit division: 0

## 2020-05-18 LAB — POCT I-STAT 7, (LYTES, BLD GAS, ICA,H+H)
Acid-Base Excess: 1 mmol/L (ref 0.0–2.0)
Acid-Base Excess: 5 mmol/L — ABNORMAL HIGH (ref 0.0–2.0)
Acid-Base Excess: 1 mmol/L (ref 0.0–2.0)
Bicarbonate: 24.7 mmol/L (ref 20.0–28.0)
Bicarbonate: 28.6 mmol/L — ABNORMAL HIGH (ref 20.0–28.0)
Bicarbonate: 25.4 mmol/L (ref 20.0–28.0)
Calcium, Ion: 1.14 mmol/L — ABNORMAL LOW (ref 1.15–1.40)
Calcium, Ion: 1.12 mmol/L — ABNORMAL LOW (ref 1.15–1.40)
Calcium, Ion: 1.09 mmol/L — ABNORMAL LOW (ref 1.15–1.40)
HCT: 29 % — ABNORMAL LOW (ref 39.0–52.0)
HCT: 30 % — ABNORMAL LOW (ref 39.0–52.0)
HCT: 35 % — ABNORMAL LOW (ref 39.0–52.0)
Hemoglobin: 9.9 g/dL — ABNORMAL LOW (ref 13.0–17.0)
Hemoglobin: 10.2 g/dL — ABNORMAL LOW (ref 13.0–17.0)
Hemoglobin: 11.9 g/dL — ABNORMAL LOW (ref 13.0–17.0)
O2 Saturation: 98 %
O2 Saturation: 99 %
O2 Saturation: 99 %
Patient temperature: 38
Patient temperature: 38.2
Patient temperature: 38
Potassium: 3.9 mmol/L (ref 3.5–5.1)
Potassium: 4.4 mmol/L (ref 3.5–5.1)
Potassium: 3.8 mmol/L (ref 3.5–5.1)
Sodium: 147 mmol/L — ABNORMAL HIGH (ref 135–145)
Sodium: 145 mmol/L (ref 135–145)
Sodium: 147 mmol/L — ABNORMAL HIGH (ref 135–145)
TCO2: 26 mmol/L (ref 22–32)
TCO2: 30 mmol/L (ref 22–32)
TCO2: 27 mmol/L (ref 22–32)
pCO2 arterial: 38 mmHg (ref 32.0–48.0)
pCO2 arterial: 41.1 mmHg (ref 32.0–48.0)
pCO2 arterial: 40.3 mmHg (ref 32.0–48.0)
pH, Arterial: 7.426 (ref 7.350–7.450)
pH, Arterial: 7.455 — ABNORMAL HIGH (ref 7.350–7.450)
pH, Arterial: 7.411 (ref 7.350–7.450)
pO2, Arterial: 100 mmHg (ref 83.0–108.0)
pO2, Arterial: 117 mmHg — ABNORMAL HIGH (ref 83.0–108.0)
pO2, Arterial: 140 mmHg — ABNORMAL HIGH (ref 83.0–108.0)

## 2020-05-18 LAB — BPAM RBC
Blood Product Expiration Date: 202107152359
Blood Product Expiration Date: 202108042359
Blood Product Expiration Date: 202108042359
Blood Product Expiration Date: 202108052359
Blood Product Expiration Date: 202108052359
Blood Product Expiration Date: 202108052359
Blood Product Expiration Date: 202108052359
Blood Product Expiration Date: 202108052359
Blood Product Expiration Date: 202108052359
Blood Product Expiration Date: 202108052359
ISSUE DATE / TIME: 202107021547
ISSUE DATE / TIME: 202107021547
ISSUE DATE / TIME: 202107022157
ISSUE DATE / TIME: 202107022157
ISSUE DATE / TIME: 202107030111
ISSUE DATE / TIME: 202107030608
ISSUE DATE / TIME: 202107030715
ISSUE DATE / TIME: 202107030715
ISSUE DATE / TIME: 202107031131
ISSUE DATE / TIME: 202107031219
Unit Type and Rh: 5100
Unit Type and Rh: 5100
Unit Type and Rh: 5100
Unit Type and Rh: 5100
Unit Type and Rh: 5100
Unit Type and Rh: 5100
Unit Type and Rh: 5100
Unit Type and Rh: 5100
Unit Type and Rh: 5100
Unit Type and Rh: 5100

## 2020-05-18 LAB — BPAM PLATELET PHERESIS
Blood Product Expiration Date: 202107032359
Blood Product Expiration Date: 202107052359
Blood Product Expiration Date: 202107052359
ISSUE DATE / TIME: 202107030111
ISSUE DATE / TIME: 202107031002
ISSUE DATE / TIME: 202107031122
Unit Type and Rh: 5100
Unit Type and Rh: 8400
Unit Type and Rh: 9500

## 2020-05-18 LAB — PREPARE PLATELET PHERESIS
Unit division: 0
Unit division: 0
Unit division: 0

## 2020-05-18 LAB — BPAM CRYOPRECIPITATE
Blood Product Expiration Date: 202107031705
ISSUE DATE / TIME: 202107031134
Unit Type and Rh: 5100

## 2020-05-18 LAB — CBC
HCT: 30.8 % — ABNORMAL LOW (ref 39.0–52.0)
HCT: 32 % — ABNORMAL LOW (ref 39.0–52.0)
Hemoglobin: 10.5 g/dL — ABNORMAL LOW (ref 13.0–17.0)
Hemoglobin: 10.7 g/dL — ABNORMAL LOW (ref 13.0–17.0)
MCH: 29.7 pg (ref 26.0–34.0)
MCH: 30.1 pg (ref 26.0–34.0)
MCHC: 34.1 g/dL (ref 30.0–36.0)
MCHC: 33.4 g/dL (ref 30.0–36.0)
MCV: 87.3 fL (ref 80.0–100.0)
MCV: 90.1 fL (ref 80.0–100.0)
Platelets: 82 10*3/uL — ABNORMAL LOW (ref 150–400)
Platelets: 62 10*3/uL — ABNORMAL LOW (ref 150–400)
RBC: 3.53 MIL/uL — ABNORMAL LOW (ref 4.22–5.81)
RBC: 3.55 MIL/uL — ABNORMAL LOW (ref 4.22–5.81)
RDW: 15.6 % — ABNORMAL HIGH (ref 11.5–15.5)
RDW: 15.6 % — ABNORMAL HIGH (ref 11.5–15.5)
WBC: 9.8 10*3/uL (ref 4.0–10.5)
WBC: 11.6 10*3/uL — ABNORMAL HIGH (ref 4.0–10.5)
nRBC: 0.2 % (ref 0.0–0.2)
nRBC: 0.4 % — ABNORMAL HIGH (ref 0.0–0.2)

## 2020-05-18 LAB — COMPREHENSIVE METABOLIC PANEL
ALT: 1389 U/L — ABNORMAL HIGH (ref 0–44)
AST: 1710 U/L — ABNORMAL HIGH (ref 15–41)
Albumin: 3.1 g/dL — ABNORMAL LOW (ref 3.5–5.0)
Alkaline Phosphatase: 37 U/L — ABNORMAL LOW (ref 38–126)
Anion gap: 10 (ref 5–15)
BUN: 22 mg/dL (ref 8–23)
CO2: 23 mmol/L (ref 22–32)
Calcium: 7.8 mg/dL — ABNORMAL LOW (ref 8.9–10.3)
Chloride: 109 mmol/L (ref 98–111)
Creatinine, Ser: 1.39 mg/dL — ABNORMAL HIGH (ref 0.61–1.24)
GFR calc Af Amer: 57 mL/min — ABNORMAL LOW (ref >60)
GFR calc non Af Amer: 49 mL/min — ABNORMAL LOW (ref >60)
Glucose, Bld: 157 mg/dL — ABNORMAL HIGH (ref 70–99)
Potassium: 4.1 mmol/L (ref 3.5–5.1)
Sodium: 142 mmol/L (ref 135–145)
Total Bilirubin: 1.5 mg/dL — ABNORMAL HIGH (ref 0.3–1.2)
Total Protein: 4.7 g/dL — ABNORMAL LOW (ref 6.5–8.1)

## 2020-05-18 LAB — GLUCOSE, CAPILLARY
Glucose-Capillary: 125 mg/dL — ABNORMAL HIGH (ref 70–99)
Glucose-Capillary: 137 mg/dL — ABNORMAL HIGH (ref 70–99)
Glucose-Capillary: 153 mg/dL — ABNORMAL HIGH (ref 70–99)
Glucose-Capillary: 153 mg/dL — ABNORMAL HIGH (ref 70–99)
Glucose-Capillary: 165 mg/dL — ABNORMAL HIGH (ref 70–99)
Glucose-Capillary: 177 mg/dL — ABNORMAL HIGH (ref 70–99)

## 2020-05-18 LAB — RENAL FUNCTION PANEL
Albumin: 2.9 g/dL — ABNORMAL LOW (ref 3.5–5.0)
Anion gap: 9 (ref 5–15)
BUN: 26 mg/dL — ABNORMAL HIGH (ref 8–23)
CO2: 26 mmol/L (ref 22–32)
Calcium: 8 mg/dL — ABNORMAL LOW (ref 8.9–10.3)
Chloride: 109 mmol/L (ref 98–111)
Creatinine, Ser: 1.2 mg/dL (ref 0.61–1.24)
GFR calc Af Amer: >60 mL/min (ref >60)
GFR calc non Af Amer: 58 mL/min — ABNORMAL LOW (ref >60)
Glucose, Bld: 170 mg/dL — ABNORMAL HIGH (ref 70–99)
Phosphorus: 3 mg/dL (ref 2.5–4.6)
Potassium: 4.3 mmol/L (ref 3.5–5.1)
Sodium: 144 mmol/L (ref 135–145)

## 2020-05-18 LAB — PREPARE CRYOPRECIPITATE: Unit division: 0

## 2020-05-18 LAB — COOXEMETRY PANEL
Carboxyhemoglobin: 1.2 % (ref 0.5–1.5)
Carboxyhemoglobin: 0.9 % (ref 0.5–1.5)
Methemoglobin: 1.3 % (ref 0.0–1.5)
Methemoglobin: 0.7 % (ref 0.0–1.5)
O2 Saturation: 61.9 %
O2 Saturation: 66.9 %
Total hemoglobin: 10.1 g/dL — ABNORMAL LOW (ref 12.0–16.0)
Total hemoglobin: 10.9 g/dL — ABNORMAL LOW (ref 12.0–16.0)

## 2020-05-18 LAB — CALCIUM, IONIZED: Calcium, Ionized, Serum: 4.7 mg/dL (ref 4.5–5.6)

## 2020-05-18 MED ORDER — HYDROCORTISONE NA SUCCINATE PF 100 MG IJ SOLR
100.0000 mg | Freq: Once | INTRAMUSCULAR | Status: AC
  Administered 2020-05-18: 100 mg via INTRAVENOUS
  Filled 2020-05-18: qty 2

## 2020-05-18 MED ORDER — ORAL CARE MOUTH RINSE
15.0000 mL | Freq: Two times a day (BID) | OROMUCOSAL | Status: DC
  Administered 2020-05-18 – 2020-05-28 (×16): 15 mL via OROMUCOSAL

## 2020-05-18 MED ORDER — PLASMA-LYTE 148 IV SOLN: INTRAVENOUS | Status: DC

## 2020-05-18 MED ORDER — MIDAZOLAM HCL 2 MG/2ML IJ SOLN: 1.0000 mg | INTRAMUSCULAR | Status: DC | PRN

## 2020-05-18 MED ORDER — METOPROLOL TARTRATE 25 MG/10 ML ORAL SUSPENSION: 12.5000 mg | Freq: Two times a day (BID) | ORAL | Status: DC

## 2020-05-18 MED ORDER — FENTANYL CITRATE (PF) 100 MCG/2ML IJ SOLN: 50.0000 ug | INTRAMUSCULAR | Status: DC | PRN

## 2020-05-18 MED ORDER — SODIUM CHLORIDE 0.9% FLUSH
10.0000 mL | Freq: Two times a day (BID) | INTRAVENOUS | Status: DC
  Administered 2020-05-18 – 2020-05-20 (×4): 10 mL
  Administered 2020-05-20: 20 mL
  Administered 2020-05-21 – 2020-05-23 (×6): 10 mL
  Administered 2020-05-24: 20 mL
  Administered 2020-05-24 – 2020-05-28 (×8): 10 mL

## 2020-05-18 MED ORDER — SODIUM CHLORIDE 0.9% FLUSH: 10.0000 mL | INTRAVENOUS | Status: DC | PRN

## 2020-05-18 MED ORDER — FUROSEMIDE 10 MG/ML IJ SOLN
40.0000 mg | Freq: Two times a day (BID) | INTRAMUSCULAR | Status: DC
  Administered 2020-05-18 – 2020-05-20 (×5): 40 mg via INTRAVENOUS
  Filled 2020-05-18 (×6): qty 4

## 2020-05-18 MED ORDER — METOPROLOL TARTRATE 12.5 MG HALF TABLET
12.5000 mg | ORAL_TABLET | Freq: Two times a day (BID) | ORAL | Status: DC
  Administered 2020-05-18 – 2020-05-19 (×2): 12.5 mg via ORAL
  Filled 2020-05-18 (×2): qty 1

## 2020-05-18 NOTE — Progress Notes (Signed)
RT called to room to NTS patient.  RT unable to pass catheter down the right nare.  RT passed the catheter down the left nare twice with small amount of white and clear secretions suctioned.  RN present during procedure.

## 2020-05-18 NOTE — Plan of Care (Signed)
  Problem: Cardiac: Goal: Will achieve and/or maintain hemodynamic stability Outcome: Progressing   Problem: Respiratory: Goal: Respiratory status will improve Outcome: Progressing   Problem: Activity: Goal: Risk for activity intolerance will decrease Outcome: Progressing   

## 2020-05-18 NOTE — Progress Notes (Signed)
Peripherally Inserted Central Catheter Placement  The IV Nurse has discussed with the patient and/or persons authorized to consent for the patient, the purpose of this procedure and the potential benefits and risks involved with this procedure.  The benefits include less needle sticks, lab draws from the catheter, and the patient may be discharged home with the catheter. Risks include, but not limited to, infection, bleeding, blood clot (thrombus formation), and puncture of an artery; nerve damage and irregular heartbeat and possibility to perform a PICC exchange if needed/ordered by physician.  Alternatives to this procedure were also discussed.  Bard Power PICC patient education guide, fact sheet on infection prevention and patient information card has been provided to patient /or left at bedside.    PICC Placement Documentation  PICC Double Lumen 05/18/20 PICC Right Basilic 43 cm 1 cm (Active)  Indication for Insertion or Continuance of Line Vasoactive infusions 05/18/20 1546  Exposed Catheter (cm) 1 cm 05/18/20 1546  Site Assessment Clean;Dry;Intact 05/18/20 1546  Lumen #1 Status Flushed;Saline locked;Blood return noted 05/18/20 1546  Lumen #2 Status Flushed;Saline locked;Blood return noted 05/18/20 1546  Dressing Type Transparent 05/18/20 1546  Dressing Status Clean;Dry;Intact;Antimicrobial disc in place 05/18/20 1546  Safety Lock Not Applicable 05/18/20 1546  Dressing Intervention New dressing 05/18/20 1546  Dressing Change Due 05/25/20 05/18/20 1546       Ethelda Chick 05/18/2020, 3:49 PM

## 2020-05-18 NOTE — Progress Notes (Signed)
2 Days Post-Op Procedure(s) (LRB): CORONARY ARTERY BYPASS GRAFTING (CABG) X 3 (N/A) AORTIC VALVE REPLACEMENT (AVR) USING 25 MM EDWARDS INTUITY ELITE VALVE (N/A) TRANSESOPHAGEAL ECHOCARDIOGRAM (TEE) (N/A) INDOCYANINE Shaneka Efaw FLUORESCENCE IMAGING (ICG) (N/A) ENDOVEIN HARVEST OF GREATER SAPHENOUS VEIN (Right) Subjective: Hemodynamics stable , a-pacing CXR, ABG better - will proceed with PS vent wean , reduce sedation Chest tubes better with satable Hb Objective: Vital signs in last 24 hours: Temp:  [97.3 F (36.3 C)-100.6 F (38.1 C)] 100.6 F (38.1 C) (07/04 0700) Pulse Rate:  [88-94] 94 (07/04 0754) Cardiac Rhythm: Atrial paced (07/04 0357) Resp:  [0-21] 21 (07/04 0754) BP: (84-167)/(70-89) 167/79 (07/04 0754) SpO2:  [92 %-100 %] 99 % (07/04 0754) Arterial Line BP: (94-168)/(58-94) 138/71 (07/04 0700) FiO2 (%):  [50 %] 50 % (07/04 0754) Weight:  [127.2 kg] 127.2 kg (07/04 0457)  Hemodynamic parameters for last 24 hours: PAP: (30-50)/(18-35) 38/22 CVP:  [14 mmHg-22 mmHg] 16 mmHg CO:  [3.8 L/min-5.5 L/min] 4.6 L/min CI:  [1.7 L/min/m2-2.5 L/min/m2] 2.1 L/min/m2  Intake/Output from previous day: 07/03 0701 - 07/04 0700 In: 5962.1 [I.V.:2765.6; Blood:2761; NG/GT:85; IV Piggyback:350.5] Out: 4365 [Urine:1355; Emesis/NG output:500; Chest Tube:2510] Intake/Output this shift: No intake/output data recorded.       Exam    General-responds on vent    Neck- no JVD, no cervical adenopathy palpable, no carotid bruit   Lungs- clear without rales, wheezes   Cor- regular rate and rhythm, no murmur , gallop   Abdomen- soft, non-tender   Extremities - warm, non-tender, minimal edema   Neuro- oriented, appropriate, no focal weakness   Lab Results: Recent Labs    05/17/20 1715 05/17/20 1726 05/18/20 0315 05/18/20 0505  WBC 8.1  --  9.8  --   HGB 9.9*   < > 10.5* 9.9*  HCT 28.9*   < > 30.8* 29.0*  PLT 69*  --  82*  --    < > = values in this interval not displayed.   BMET:   Recent Labs    05/17/20 1500 05/17/20 1726 05/18/20 0315 05/18/20 0505  NA 143   < > 142 147*  K 3.9   < > 4.1 3.9  CL 111  --  109  --   CO2 23  --  23  --   GLUCOSE 140*  --  157*  --   BUN 18  --  22  --   CREATININE 1.44*  --  1.39*  --   CALCIUM 7.8*  --  7.8*  --    < > = values in this interval not displayed.    PT/INR:  Recent Labs    05/17/20 1500  LABPROT 20.1*  INR 1.8*   ABG    Component Value Date/Time   PHART 7.426 05/18/2020 0505   HCO3 24.7 05/18/2020 0505   TCO2 26 05/18/2020 0505   ACIDBASEDEF 5.0 (H) 05/17/2020 0812   O2SAT 98.0 05/18/2020 0505   CBG (last 3)  Recent Labs    05/17/20 2333 05/18/20 0307 05/18/20 0735  GLUCAP 145* 165* 137*    Assessment/Plan: S/P Procedure(s) (LRB): CORONARY ARTERY BYPASS GRAFTING (CABG) X 3 (N/A) AORTIC VALVE REPLACEMENT (AVR) USING 25 MM EDWARDS INTUITY ELITE VALVE (N/A) TRANSESOPHAGEAL ECHOCARDIOGRAM (TEE) (N/A) INDOCYANINE Ovie Cornelio FLUORESCENCE IMAGING (ICG) (N/A) ENDOVEIN HARVEST OF GREATER SAPHENOUS VEIN (Right) Extubate, mobilize Lasix Leave chest tubes today Temp 100- send sputum cultures   LOS: 3 days    Kathlee Nations Trigt III 05/18/2020

## 2020-05-18 NOTE — Progress Notes (Signed)
CT surgery p.m. Rounds  Patient successfully extubated today Was able to dangle on side of bed and stand with assistance x3  Cardiac output and blood pressure satisfactory on milrinone and epinephrine 4 mcg/min Sinus rhythm  P.m. labs are satisfactory, potassium 3.8 hemoglobin 10.7   platelet 62,000  Diuresing with IV Lasix twice daily

## 2020-05-18 NOTE — Progress Notes (Signed)
80 ml of Fentanyl wasted with Vanessa Ralphs in to the steri cycle.

## 2020-05-18 NOTE — Progress Notes (Signed)
Pt extubated and placed on [place] 4L.BBS present throughout all lung fields. No stridor noted. Pt is stable at this time

## 2020-05-18 NOTE — Procedures (Signed)
Extubation Procedure Note  Patient Details:   Name: Sean Hubbard DOB: 01-13-1941 MRN: [id number]   Airway Documentation:    Vent end date: 05/18/20 Vent end time: 1455   Evaluation  O2 sats: stable throughout Complications: No apparent complications Patient did tolerate procedure well. Bilateral Breath Sounds: Clear, Diminished   Yes   Patient was extubated to a 4L [place] without any complications, dyspnea or stridor noted one hour after steroids were given by RN due to no cuff leak. Patient was instructed on IS x 5, highest goal reached was . NIF: -35, VC: 1L.   Ailin Rochford, Margaretmary Dys 05/18/2020, 2:55 PM

## 2020-05-19 ENCOUNTER — Inpatient Hospital Stay (HOSPITAL_COMMUNITY): Payer: Medicare Other

## 2020-05-19 ENCOUNTER — Encounter (HOSPITAL_COMMUNITY): Payer: Self-pay | Admitting: Cardiothoracic Surgery

## 2020-05-19 LAB — POCT I-STAT 7, (LYTES, BLD GAS, ICA,H+H)
Acid-Base Excess: 3 mmol/L — ABNORMAL HIGH (ref 0.0–2.0)
Bicarbonate: 27.6 mmol/L (ref 20.0–28.0)
Calcium, Ion: 1.13 mmol/L — ABNORMAL LOW (ref 1.15–1.40)
HCT: 30 % — ABNORMAL LOW (ref 39.0–52.0)
Hemoglobin: 10.2 g/dL — ABNORMAL LOW (ref 13.0–17.0)
O2 Saturation: 93 %
Patient temperature: 97.8
Potassium: 3.6 mmol/L (ref 3.5–5.1)
Sodium: 141 mmol/L (ref 135–145)
TCO2: 29 mmol/L (ref 22–32)
pCO2 arterial: 38.8 mmHg (ref 32.0–48.0)
pH, Arterial: 7.459 — ABNORMAL HIGH (ref 7.350–7.450)
pO2, Arterial: 62 mmHg — ABNORMAL LOW (ref 83.0–108.0)

## 2020-05-19 LAB — CBC
HCT: 30.4 % — ABNORMAL LOW (ref 39.0–52.0)
HCT: 31.7 % — ABNORMAL LOW (ref 39.0–52.0)
Hemoglobin: 9.9 g/dL — ABNORMAL LOW (ref 13.0–17.0)
Hemoglobin: 10.4 g/dL — ABNORMAL LOW (ref 13.0–17.0)
MCH: 29.4 pg (ref 26.0–34.0)
MCH: 29.4 pg (ref 26.0–34.0)
MCHC: 32.6 g/dL (ref 30.0–36.0)
MCHC: 32.8 g/dL (ref 30.0–36.0)
MCV: 90.2 fL (ref 80.0–100.0)
MCV: 89.5 fL (ref 80.0–100.0)
Platelets: 49 10*3/uL — ABNORMAL LOW (ref 150–400)
Platelets: 77 10*3/uL — ABNORMAL LOW (ref 150–400)
RBC: 3.37 MIL/uL — ABNORMAL LOW (ref 4.22–5.81)
RBC: 3.54 MIL/uL — ABNORMAL LOW (ref 4.22–5.81)
RDW: 15.5 % (ref 11.5–15.5)
RDW: 14.7 % (ref 11.5–15.5)
WBC: 9.4 10*3/uL (ref 4.0–10.5)
WBC: 15.5 10*3/uL — ABNORMAL HIGH (ref 4.0–10.5)
nRBC: 0.4 % — ABNORMAL HIGH (ref 0.0–0.2)
nRBC: 0 % (ref 0.0–0.2)

## 2020-05-19 LAB — COMPREHENSIVE METABOLIC PANEL
ALT: 1507 U/L — ABNORMAL HIGH (ref 0–44)
AST: 1184 U/L — ABNORMAL HIGH (ref 15–41)
Albumin: 2.8 g/dL — ABNORMAL LOW (ref 3.5–5.0)
Alkaline Phosphatase: 49 U/L (ref 38–126)
Anion gap: 9 (ref 5–15)
BUN: 27 mg/dL — ABNORMAL HIGH (ref 8–23)
CO2: 26 mmol/L (ref 22–32)
Calcium: 7.8 mg/dL — ABNORMAL LOW (ref 8.9–10.3)
Chloride: 109 mmol/L (ref 98–111)
Creatinine, Ser: 1.02 mg/dL (ref 0.61–1.24)
GFR calc Af Amer: >60 mL/min (ref >60)
GFR calc non Af Amer: >60 mL/min (ref >60)
Glucose, Bld: 133 mg/dL — ABNORMAL HIGH (ref 70–99)
Potassium: 3.8 mmol/L (ref 3.5–5.1)
Sodium: 144 mmol/L (ref 135–145)
Total Bilirubin: 1.4 mg/dL — ABNORMAL HIGH (ref 0.3–1.2)
Total Protein: 4.8 g/dL — ABNORMAL LOW (ref 6.5–8.1)

## 2020-05-19 LAB — RENAL FUNCTION PANEL
Albumin: 2.9 g/dL — ABNORMAL LOW (ref 3.5–5.0)
Anion gap: 10 (ref 5–15)
BUN: 29 mg/dL — ABNORMAL HIGH (ref 8–23)
CO2: 24 mmol/L (ref 22–32)
Calcium: 7.7 mg/dL — ABNORMAL LOW (ref 8.9–10.3)
Chloride: 104 mmol/L (ref 98–111)
Creatinine, Ser: 0.94 mg/dL (ref 0.61–1.24)
GFR calc Af Amer: >60 mL/min (ref >60)
GFR calc non Af Amer: >60 mL/min (ref >60)
Glucose, Bld: 123 mg/dL — ABNORMAL HIGH (ref 70–99)
Phosphorus: 2.3 mg/dL — ABNORMAL LOW (ref 2.5–4.6)
Potassium: 3.8 mmol/L (ref 3.5–5.1)
Sodium: 138 mmol/L (ref 135–145)

## 2020-05-19 LAB — COOXEMETRY PANEL
Carboxyhemoglobin: 1.2 % (ref 0.5–1.5)
Methemoglobin: 1.1 % (ref 0.0–1.5)
O2 Saturation: 56.9 %
Total hemoglobin: 13.6 g/dL (ref 12.0–16.0)

## 2020-05-19 LAB — POCT I-STAT, CHEM 8
BUN: 26 mg/dL — ABNORMAL HIGH (ref 8–23)
Calcium, Ion: 1.15 mmol/L (ref 1.15–1.40)
Chloride: 106 mmol/L (ref 98–111)
Creatinine, Ser: 0.8 mg/dL (ref 0.61–1.24)
Glucose, Bld: 136 mg/dL — ABNORMAL HIGH (ref 70–99)
HCT: 35 % — ABNORMAL LOW (ref 39.0–52.0)
Hemoglobin: 11.9 g/dL — ABNORMAL LOW (ref 13.0–17.0)
Potassium: 3.7 mmol/L (ref 3.5–5.1)
Sodium: 146 mmol/L — ABNORMAL HIGH (ref 135–145)
TCO2: 26 mmol/L (ref 22–32)

## 2020-05-19 LAB — GLUCOSE, CAPILLARY
Glucose-Capillary: 126 mg/dL — ABNORMAL HIGH (ref 70–99)
Glucose-Capillary: 118 mg/dL — ABNORMAL HIGH (ref 70–99)
Glucose-Capillary: 155 mg/dL — ABNORMAL HIGH (ref 70–99)
Glucose-Capillary: 147 mg/dL — ABNORMAL HIGH (ref 70–99)
Glucose-Capillary: 144 mg/dL — ABNORMAL HIGH (ref 70–99)
Glucose-Capillary: 124 mg/dL — ABNORMAL HIGH (ref 70–99)

## 2020-05-19 MED ORDER — AMIODARONE LOAD VIA INFUSION
150.0000 mg | Freq: Once | INTRAVENOUS | Status: AC
  Administered 2020-05-19: 150 mg via INTRAVENOUS
  Filled 2020-05-19: qty 83.34

## 2020-05-19 MED ORDER — NICARDIPINE HCL IN NACL 20-0.86 MG/200ML-% IV SOLN
5.0000 mg/h | INTRAVENOUS | Status: DC
  Administered 2020-05-19: 5 mg/h via INTRAVENOUS
  Filled 2020-05-19 (×2): qty 200

## 2020-05-19 MED ORDER — DILTIAZEM HCL-DEXTROSE 125-5 MG/125ML-% IV SOLN (PREMIX)
7.5000 mg/h | INTRAVENOUS | Status: DC
  Administered 2020-05-19 – 2020-05-20 (×2): 5 mg/h via INTRAVENOUS
  Administered 2020-05-21: 7.5 mg/h via INTRAVENOUS
  Administered 2020-05-22: 5 mg/h via INTRAVENOUS
  Filled 2020-05-19 (×5): qty 125

## 2020-05-19 MED ORDER — METOPROLOL TARTRATE 25 MG/10 ML ORAL SUSPENSION: 25.0000 mg | Freq: Two times a day (BID) | ORAL | Status: DC

## 2020-05-19 MED ORDER — AMIODARONE HCL IN DEXTROSE 360-4.14 MG/200ML-% IV SOLN
60.0000 mg/h | INTRAVENOUS | Status: AC
  Administered 2020-05-19 (×2): 60 mg/h via INTRAVENOUS
  Filled 2020-05-19: qty 400

## 2020-05-19 MED ORDER — LEVALBUTEROL HCL 1.25 MG/0.5ML IN NEBU
1.2500 mg | INHALATION_SOLUTION | Freq: Three times a day (TID) | RESPIRATORY_TRACT | Status: DC
  Administered 2020-05-20 – 2020-05-23 (×10): 1.25 mg via RESPIRATORY_TRACT
  Filled 2020-05-19 (×11): qty 0.5

## 2020-05-19 MED ORDER — MORPHINE SULFATE (PF) 2 MG/ML IV SOLN: 1.0000 mg | INTRAVENOUS | Status: DC | PRN

## 2020-05-19 MED ORDER — LEVALBUTEROL HCL 1.25 MG/0.5ML IN NEBU
1.2500 mg | INHALATION_SOLUTION | Freq: Four times a day (QID) | RESPIRATORY_TRACT | Status: DC
  Administered 2020-05-19 (×2): 1.25 mg via RESPIRATORY_TRACT
  Filled 2020-05-19 (×2): qty 0.5

## 2020-05-19 MED ORDER — METOPROLOL TARTRATE 12.5 MG HALF TABLET
12.5000 mg | ORAL_TABLET | Freq: Two times a day (BID) | ORAL | Status: DC
  Administered 2020-05-19 – 2020-05-20 (×3): 12.5 mg via ORAL
  Filled 2020-05-19 (×4): qty 1

## 2020-05-19 MED ORDER — AMIODARONE IV BOLUS ONLY 150 MG/100ML
150.0000 mg | Freq: Once | INTRAVENOUS | Status: AC
  Administered 2020-05-19: 150 mg via INTRAVENOUS

## 2020-05-19 MED ORDER — POTASSIUM CHLORIDE 10 MEQ/50ML IV SOLN
10.0000 meq | INTRAVENOUS | Status: AC
  Administered 2020-05-19 (×2): 10 meq via INTRAVENOUS
  Filled 2020-05-19 (×2): qty 50

## 2020-05-19 MED ORDER — AMIODARONE HCL IN DEXTROSE 360-4.14 MG/200ML-% IV SOLN
30.0000 mg/h | INTRAVENOUS | Status: DC
  Administered 2020-05-19 – 2020-05-22 (×5): 30 mg/h via INTRAVENOUS
  Filled 2020-05-19 (×6): qty 200

## 2020-05-19 NOTE — Progress Notes (Signed)
Patient in Afib with HR in  90s-100s and hypertensive with SBP 170-190s. MD notified. New orders given verbally for Amiodarone protocol, Cardene drip and 2 runs of K+. Patient hemodynamically stable. Will continue to monitor.

## 2020-05-19 NOTE — Progress Notes (Signed)
CT surgery p.m. Rounds  The patient is on amiodarone and IV Cardizem  still in atrial fibrillation since the a.m.  Chest tube output continues to decrease Blood pressure is stable Patient out of bed to chair with stable pulmonary status. Tracheal suction this morning shows white cells but no organism, continue antibiotics  Worked with PT but was very weak.  CIR was recommended for later in his hospital stay

## 2020-05-19 NOTE — Evaluation (Signed)
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  Pt has been admitted with CAD involving the native coronary arteries with unstable angina. 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  Pt weaned from vent 05/18/20.  Clinical Impression  Pt admitted with unstable angina and is s/p CABG on POD 3.  Pt requiring mod A of 2 for mobility and monitoring of lines/leads.  He was able to stand with [facility] and transfer to chair.  Pt's VSS but was diaphoretic during evaluation and fatigued easily. Required multimodal cues for transfers and sternal precautions. Pt normally very independent and active.  He has good family support and good rehab potential. Pt currently with functional limitations due to the deficits listed below (see PT Problem List). Pt will benefit from skilled PT to increase their independence and safety with mobility to allow discharge to the venue listed below.       Follow Up Recommendations CIR    Equipment Recommendations  Rolling walker with 5" wheels;3in1 (PT);Other (comment) (needs further assessment)    Recommendations for Other Services       Precautions / Restrictions Precautions Precautions: Sternal;Fall Precaution Booklet Issued: No Precaution Comments: Pt with 3 chest tubes, wound vac, and central line      Mobility  Bed Mobility Overal bed mobility: Needs Assistance Bed Mobility: Supine to Sit     Supine to sit: Mod assist;+2 for physical assistance     General bed mobility comments: assist for bil LE off of bed and then mod A x 2 to lift trunk - had pt hold sternal pillow  Transfers Overall transfer level: Needs assistance   Transfers: Sit to/from Stand;Stand Pivot  Transfers Sit to Stand: From elevated surface;Min assist;+2 physical assistance Stand pivot transfers: Total assist (use of Stedy)       General transfer comment: Pt performed sit to stand from elevated bed x 1 and x 2 from [place]; required min A x2 to power up and cues for sternal precautions  Ambulation/Gait             General Gait Details: deferred  Stairs            Wheelchair Mobility    Modified Rankin (Stroke Patients Only)       Balance Overall balance assessment: Needs assistance Sitting-balance support: Bilateral upper extremity supported Sitting balance-Leahy Scale: Poor Sitting balance - Comments: Requiring min guard for safety and bil hands on lap   Standing balance support: Bilateral upper extremity supported Standing balance-Leahy Scale: Poor Standing balance comment: Stood for 2 episodes of 1-1.5 minutes in stedy with support for balance                             Pertinent Vitals/Pain Pain Assessment: No/denies pain    Home Living Family/patient expects to be discharged to:: Private residence Living Arrangements: Spouse/significant other Available Help at Discharge: Family;Available 24 hours/day Type of Home: House Home Access: Stairs to enter Entrance Stairs-Rails: Right;Left (cannot reach both) Entrance Stairs-Number of Steps: 3 Home Layout: One level Home Equipment: Cane - single point      Prior Function Level of Independence: Independent  Comments: Independent with ADLs and IADLs; likes to fish and garden; has used a cane for the past week but normally does not require AD.      Hand Dominance        Extremity/Trunk Assessment   Upper Extremity Assessment Upper Extremity Assessment: LUE deficits/detail;RUE deficits/detail RUE Deficits / Details: Overall WFL; however, limited testing due to sternal precautions LUE Deficits / Details: Overall WFL; however, limited testing due to sternal precautions     Lower Extremity Assessment Lower Extremity Assessment: LLE deficits/detail;RLE deficits/detail RLE Deficits / Details: Pt was stiff throughout LE but able to achieve [facility] ROM with increased time.  MMT grossly 3/5 throughout LLE Deficits / Details: Pt was stiff throughout LE but able to achieve [facility] ROM with increased time.  MMT grossly 3/5 throughout    Cervical / Trunk Assessment Cervical / Trunk Assessment: Normal  Communication   Communication: No difficulties  Cognition Arousal/Alertness: Awake/alert Behavior During Therapy: WFL for tasks assessed/performed Overall Cognitive Status: Within Functional Limits for tasks assessed                                        General Comments General comments (skin integrity, edema, etc.): Pt's RNs into assist during eval.  Pt was very diaphoretic - RN reports this has been his normal for the day.  BP and O2 sats were stable.  HR was low 100's at rest and up to 133 bpm during transfer.    Exercises     Assessment/Plan    PT Assessment Patient needs continued PT services  PT Problem List Decreased strength;Decreased mobility;Decreased range of motion;Decreased coordination;Decreased knowledge of precautions;Decreased activity tolerance;Cardiopulmonary status limiting activity;Decreased balance;Decreased knowledge of use of DME;Decreased safety awareness       PT Treatment Interventions DME instruction;Therapeutic activities;Gait training;Therapeutic exercise;Patient/family education;Stair training;Balance training;Functional mobility training    PT Goals (Current goals can be found in the Care Plan section)  Acute Rehab PT Goals Patient Stated Goal: agreeable to rehab if needed PT Goal Formulation: With patient Time For Goal Achievement: 06/02/20 Potential to Achieve Goals: Good    Frequency Min 3X/week   Barriers to discharge        Co-evaluation               AM-PAC PT "6 Clicks" Mobility  Outcome  Measure Help needed turning from your back to your side while in a flat bed without using bedrails?: A Lot Help needed moving from lying on your back to sitting on the side of a flat bed without using bedrails?: A Lot Help needed moving to and from a bed to a chair (including a wheelchair)?: Total Help needed standing up from a chair using your arms (e.g., wheelchair or bedside chair)?: A Lot Help needed to walk in hospital room?: Total Help needed climbing 3-5 steps with a railing? : Total 6 Click Score: 9    End of Session Equipment Utilized During Treatment: Other (comment) Antony Salmon) Activity Tolerance: Patient tolerated treatment well Patient left: in chair;with call bell/phone within reach [employer] pad in place) Nurse Communication: Mobility status PT Visit Diagnosis: Muscle weakness (generalized) (M62.81);Other abnormalities of gait and mobility (R26.89)    Time: 7782-4235 PT Time Calculation (min) (ACUTE ONLY): 38 min   Charges:   PT Evaluation $PT Eval High Complexity: 1 High PT Treatments $Therapeutic Activity: 8-22 mins        Glenette Bookwalter,  PT Acute Rehab Services Pager [phone] [facility] [phone]    Rayetta Humphrey 05/19/2020, 5:30 PM

## 2020-05-19 NOTE — Progress Notes (Addendum)
3 Days Post-Op Procedure(s) (LRB): CORONARY ARTERY BYPASS GRAFTING (CABG) X 3 (N/A) AORTIC VALVE REPLACEMENT (AVR) USING 25 MM EDWARDS INTUITY ELITE VALVE (N/A) TRANSESOPHAGEAL ECHOCARDIOGRAM (TEE) (N/A) INDOCYANINE GREEN FLUORESCENCE IMAGING (ICG) (N/A) ENDOVEIN HARVEST OF GREATER SAPHENOUS VEIN (Right) Subjective: Patient's pain well controlled and mental status appropriate Pulmonary status stable overnight but patient now in rapid atrial fibrillation on amiodarone drip. Chest tube output is decreasing but over 1 L total drained yesterday-we will leave chest tubes in place  Goal for today is to remove Swan patient out of bed to chair and continue to attempt to convert to sinus rhythm  Objective: Vital signs in last 24 hours: Temp:  [98.4 F (36.9 C)-101.1 F (38.4 C)] 99.7 F (37.6 C) (07/05 0800) Pulse Rate:  [53-116] 88 (07/05 0800) Cardiac Rhythm: Atrial fibrillation (07/05 0800) Resp:  [12-29] 17 (07/05 0800) BP: (117-154)/(62-85) 123/84 (07/05 0800) SpO2:  [95 %-100 %] 100 % (07/05 0800) Arterial Line BP: (120-198)/(63-136) 141/74 (07/05 0800) FiO2 (%):  [50 %] 50 % (07/04 1118) Weight:  [126.9 kg] 126.9 kg (07/05 0500)  Hemodynamic parameters for last 24 hours: PAP: (33-92)/(17-39) 41/27 CVP:  [10 mmHg-37 mmHg] 15 mmHg CO:  [4.5 L/min-6.7 L/min] 5.7 L/min CI:  [2 L/min/m2-3 L/min/m2] 2.6 L/min/m2  Intake/Output from previous day: 07/04 0701 - 07/05 0700 In: 1866.2 [P.O.:120; I.V.:1745.1; IV Piggyback:1.2] Out: 3312 [Urine:2200; Stool:2; Chest Tube:1110] Intake/Output this shift: Total I/O In: 177.8 [I.V.:82.8; IV Piggyback:95] Out: 400 [Urine:350; Chest Tube:50]       Exam    General- alert and comfortable    Neck- no JVD, no cervical adenopathy palpable, no carotid bruit   Lungs- clear without rales, wheezes, wet cough   Cor-irregular rate and rhythm, atrial fibrillation, no murmur , gallop   Abdomen- soft, non-tender   Extremities - warm, non-tender,  minimal edema   Neuro- oriented, appropriate, no focal weakness   Lab Results: Recent Labs    05/18/20 1632 05/18/20 1712 05/18/20 2249 05/19/20 0308  WBC 11.6*  --   --  9.4  HGB 10.7*   < > 11.9* 9.9*  HCT 32.0*   < > 35.0* 30.4*  PLT 62*  --   --  49*   < > = values in this interval not displayed.   BMET:  Recent Labs    05/18/20 1632 05/18/20 1712 05/18/20 2249 05/19/20 0308  NA 144   < > 146* 144  K 4.3   < > 3.7 3.8  CL 109   < > 106 109  CO2 26  --   --  26  GLUCOSE 170*   < > 136* 133*  BUN 26*   < > 26* 27*  CREATININE 1.20   < > 0.80 1.02  CALCIUM 8.0*  --   --  7.8*   < > = values in this interval not displayed.    PT/INR:  Recent Labs    05/17/20 1500  LABPROT 20.1*  INR 1.8*   ABG    Component Value Date/Time   PHART 7.411 05/18/2020 1712   HCO3 25.4 05/18/2020 1712   TCO2 26 05/18/2020 2249   ACIDBASEDEF 5.0 (H) 05/17/2020 0812   O2SAT 56.9 05/19/2020 0308   CBG (last 3)  Recent Labs    05/18/20 2349 05/19/20 0255 05/19/20 0736  GLUCAP 125* 126* 118*    Assessment/Plan: S/P Procedure(s) (LRB): CORONARY ARTERY BYPASS GRAFTING (CABG) X 3 (N/A) AORTIC VALVE REPLACEMENT (AVR) USING 25 MM EDWARDS INTUITY ELITE VALVE (N/A) TRANSESOPHAGEAL  ECHOCARDIOGRAM (TEE) (N/A) INDOCYANINE GREEN FLUORESCENCE IMAGING (ICG) (N/A) ENDOVEIN HARVEST OF GREATER SAPHENOUS VEIN (Right) Continue amiodarone and supplement potassium for atrial fibrillation. Cardizem drip at low dose to help cardioversion, was ordered but not started Stop Cardene which was not ordered Diuresis for fluid overload weight up 15 pounds Mobilize out of bed to chair Leave chest tubes in place today   LOS: 4 days    Kathlee Nations Trigt III 05/19/2020

## 2020-05-19 NOTE — Discharge Summary (Signed)
Physician Discharge Summary  Patient ID: Sean Hubbard MRN: [id number] DOB/AGE: 77-22-1942 77 y.o.  Admit date: 05/15/2020 Discharge date: 05/29/2020  Admission Diagnoses:  Unstable angina pectoris Coronary artery disease  Discharge Diagnoses:   Unstable angina (HCC)  Dilated cardiomyopathy (HCC)  Severe aortic stenosis  CAD (coronary artery disease)  Essential hypertension  Hyperlipidemia LDL goal <70  S/P CABG x 4 Postoperative atrial fibrillation Postoperative coagulopathy Expected acute blood loss anemia  Patient Active Problem List   Diagnosis Date Noted  . S/P CABG x 4 05/16/2020  . Severe aortic stenosis 05/15/2020  . CAD (coronary artery disease) 05/15/2020  . Essential hypertension 05/15/2020  . Hyperlipidemia LDL goal <70 05/15/2020  . Unstable angina (HCC) 05/14/2020  . Dilated cardiomyopathy (HCC) 05/14/2020    Discharged Condition: good  History of Present Illness:     We are kindly asked to see this 79 year old gentleman who presented with lightheadedness.  He has a history of coronary artery disease status post PCI of the RCA over 20 years ago.  He is a retired Insurance underwriter.  He has been in his usual state of health until recently when began to notice dyspnea on exertion and lightheadedness.  Examination demonstrated a heart murmur.  This was followed up with echocardiogram demonstrating moderate to severe aortic stenosis.  He underwent left heart catheterization today showing multivessel coronary artery disease and was referred for aortic valve replacement and coronary artery bypass grafting.  Presently, he is hemodynamically stable and symptom free.  He denies any edema in his lower extremities.  He denies orthopnea or PND.  Hospital Course:  Sean Hubbard remained stable following left heart catheterization.  He decided to proceed with surgery.  He was taken to the operating room on 05/16/2020 where CABG x4 was accomplished along with aortic valve replacement.   Please see the operative note for details.  Following the procedure, he separated from cardiopulmonary bypass without difficulty and was transferred to the surgical ICU. He was coagulopathic early postoperatively and required resuscitation and transfusion of blood products.  On the morning of the first postoperative day he had significant accumulation of fluid in his right chest.  Right chest tube was placed and positioning was confirmed with chest x-ray.  He remained intubated until the second postoperative day due to resuscitation requirements as well as significant agitation.  He remained hemodynamically stable although he required inotropic support with milrinone and epinephrine.  On the third postoperative day, he developed atrial fibrillation and was started on IV amiodarone.  Additionally Cardizem was added for additional rate control.  He has subsequently been cardioverted chemically to sinus rhythm and amiodarone has been transitioned to oral dosing.  The Cardizem has been stopped as he has some bradycardia.  Additionally has been started on Eliquis.  He will also be maintained on Plavix but no aspirin at this time.  He has had significant postoperative volume overload but is responding well over time to diuretics.  Is also noted a significant increase in his LFTs which were felt to at least be in part due to passive congestion from volume overload/heart failure.  These values have stabilized approaching normal.  He will require ongoing diuresis discharge.  He has maintained stable renal function and most recent BUN/creatinine are normal. he has had moderate chest tube drainage for several days but it is trending over time and chest tubes were removed .  His acute blood loss anemia has stabilized over time and most recent hemoglobin and hematocrit values are  11/35 .  Oxygen has been weaned and he maintains good saturations on room air.  Incisions are noted to be healing well without evidence of infection.   On chest x-ray he does have some small pleural effusions and atelectasis which are being monitored clinically.  He is improving in regards to his activity level as well as pulmonary toilet/incentive spirometry. At the time of discharge he is felt to be quite stable.  Consults: None  Significant Diagnostic Studies: Routine postoperative labs and serial chest x-rays.  Treatments: surgery:  CARDIOTHORACIC SURGERY OPERATIVE NOTE  Date of Procedure:    05/16/2020 Preoperative Diagnosis:      Severe 3-vessel Coronary Artery Disease, aortic valve stenosis  Postoperative Diagnosis:    Same  Procedure:        Aortic valve replacement 25 mm Edwards Intuity bovine bioprosthetic  Coronary Artery Bypass Grafting x 4             Left Internal Mammary Artery to Distal Left Anterior Descending Coronary Artery; Saphenous Vein Graft to Posterior Descending Coronary Artery; Saphenous Vein Graft to Obtuse Marginal Branch of Left Circumflex Coronary Artery;  pedicled right internal mammary artery graft to right posterior lateral coronary artery graft; Endoscopic Vein Harvest from right thigh and Lower Leg Bilateral internal mammary artery harvesting Multilevel rib block with Exparel solution Completion graft surveillance with indocyanine green fluorescence imaging  Surgeon:        B.  Lorayne Marek, MD  Assistant:       Webb Laws PA-C  Anesthesia:    General  Operative Findings: ? Preserved left ventricular systolic function ? Good quality  internal mammary artery conduits ? Good quality saphenous vein conduit ? Good quality target vessels for grafting Discharge Exam: Blood pressure 107/68, pulse 80, temperature 97.6 F (36.4 C), temperature source Oral, resp. rate 19, height 5' 8.5" (1.74 m), weight 112.8 kg, SpO2 98 %.  General appearance: alert, cooperative and no distress Heart: regular rate and rhythm Lungs: clear to auscultation bilaterally Abdomen: benign Extremities: + edema Wound:  incis healing well   Disposition:  Discharge disposition: 01-Home or Self Care       Discharge Instructions    Discharge patient   Complete by: As directed    Discharge disposition: 01-Home or Self Care   Discharge patient date: 05/29/2020     Allergies as of 05/29/2020   No Known Allergies     Medication List    STOP taking these medications   aspirin EC 81 MG tablet     TAKE these medications   allopurinol 100 MG tablet Commonly known as: ZYLOPRIM Take 100 mg by mouth daily.   amiodarone 200 MG tablet Commonly known as: PACERONE Take 1 tablet (200 mg total) by mouth 2 (two) times daily.   apixaban 5 MG Tabs tablet Commonly known as: ELIQUIS Take 1 tablet (5 mg total) by mouth 2 (two) times daily.   atorvastatin 40 MG tablet Commonly known as: LIPITOR Take 40 mg by mouth daily at 6 PM.   cholecalciferol 25 MCG (1000 UNIT) tablet Commonly known as: VITAMIN D Take 1,000 Units by mouth daily.   clopidogrel 75 MG tablet Commonly known as: PLAVIX Take 1 tablet (75 mg total) by mouth daily.   Co Q 10 100 MG Caps Take 100 mg by mouth daily.   colchicine 0.6 MG tablet Take 1 tablet (0.6 mg total) by mouth 2 (two) times daily.   ezetimibe 10 MG tablet Commonly known as: ZETIA Take 1  tablet (10 mg total) by mouth daily. Please call to schedule appointment for further refills. Thank you! What changed:   when to take this  additional instructions   furosemide 40 MG tablet Commonly known as: LASIX Take 1 tablet (40 mg total) by mouth daily.   KRILL OIL PO Take 750 mg by mouth daily.   metoprolol succinate 50 MG 24 hr tablet Commonly known as: TOPROL-XL Take 50 mg by mouth every evening. Take with or immediately following a meal.   multivitamin with minerals tablet Take 1 tablet by mouth daily.   NASACORT ALLERGY 24HR NA Place 1 spray into the nose daily.   omeprazole 20 MG capsule Commonly known as: PRILOSEC Take 20 mg by mouth daily.   OVER  THE COUNTER MEDICATION Apply 1 application topically 2 (two) times daily. Vidacann  Arthritis cream   potassium chloride SA 20 MEQ tablet Commonly known as: KLOR-CON Take 1 tablet (20 mEq total) by mouth daily.   traMADol 50 MG tablet Commonly known as: ULTRAM Take 1 tablet (50 mg total) by mouth every 6 (six) hours as needed for moderate pain.   Turmeric 500 MG Tabs Take 1,000 mg by mouth daily.   vitamin C 1000 MG tablet Take 1,000 mg by mouth daily.            Durable Medical Equipment  (From admission, onward)         Start     Ordered   05/24/20 0924  For home use only DME 3 n 1  Once        05/24/20 0924   05/24/20 0924  For home use only DME Walker rolling  Once       Question Answer Comment  Walker: With 5 Inch Wheels   Patient needs a walker to treat with the following condition Physical deconditioning      05/24/20 0924          Follow-up Information    Antonieta Iba, MD Follow up.   Specialty: Cardiology Why: Please see discharge paperwork for follow-up appointment with cardiology. Contact information: [address] [place] [address] [phone]        Linden Dolin, MD Follow up.   Specialty: Cardiothoracic Surgery Why: Please see discharge paperwork for follow-up appointment with surgeon. Contact information: [address] [place] [address] [phone]        Lynnea Ferrier, MD. Call.   Specialty: Internal Medicine Why: for a follow up appointment regarding further surveillance of HGA1C 5.6 Contact information: [address] [place] [address] [phone]              The patient has been discharged on:   1.Beta Blocker:  Yes [  y ]                              No   [   ]                              If No, reason:  2.Ace Inhibitor/ARB: Yes [   ]                                     No  [  n  ]  If No, reason:labile  BP  3.Statin:   Yes [place].Etienne[y   ]                  No  [   ]                  If No, reason:  4.Ecasa:  Yes  [   ]                  No   [ n  ]                  If No, reason:on plavix and eliquis     Signed: Rowe ClackWayne E Zemira Zehring, PA-C

## 2020-05-20 ENCOUNTER — Inpatient Hospital Stay (HOSPITAL_COMMUNITY): Payer: Medicare Other

## 2020-05-20 LAB — COMPREHENSIVE METABOLIC PANEL
ALT: 1117 U/L — ABNORMAL HIGH (ref 0–44)
AST: 409 U/L — ABNORMAL HIGH (ref 15–41)
Albumin: 2.7 g/dL — ABNORMAL LOW (ref 3.5–5.0)
Alkaline Phosphatase: 58 U/L (ref 38–126)
Anion gap: 10 (ref 5–15)
BUN: 28 mg/dL — ABNORMAL HIGH (ref 8–23)
CO2: 25 mmol/L (ref 22–32)
Calcium: 7.6 mg/dL — ABNORMAL LOW (ref 8.9–10.3)
Chloride: 102 mmol/L (ref 98–111)
Creatinine, Ser: 0.84 mg/dL (ref 0.61–1.24)
GFR calc Af Amer: >60 mL/min (ref >60)
GFR calc non Af Amer: >60 mL/min (ref >60)
Glucose, Bld: 121 mg/dL — ABNORMAL HIGH (ref 70–99)
Potassium: 3.3 mmol/L — ABNORMAL LOW (ref 3.5–5.1)
Sodium: 137 mmol/L (ref 135–145)
Total Bilirubin: 1.1 mg/dL (ref 0.3–1.2)
Total Protein: 4.7 g/dL — ABNORMAL LOW (ref 6.5–8.1)

## 2020-05-20 LAB — RENAL FUNCTION PANEL
Albumin: 2.7 g/dL — ABNORMAL LOW (ref 3.5–5.0)
Anion gap: 10 (ref 5–15)
BUN: 25 mg/dL — ABNORMAL HIGH (ref 8–23)
CO2: 26 mmol/L (ref 22–32)
Calcium: 7.5 mg/dL — ABNORMAL LOW (ref 8.9–10.3)
Chloride: 99 mmol/L (ref 98–111)
Creatinine, Ser: 0.92 mg/dL (ref 0.61–1.24)
GFR calc Af Amer: >60 mL/min (ref >60)
GFR calc non Af Amer: >60 mL/min (ref >60)
Glucose, Bld: 240 mg/dL — ABNORMAL HIGH (ref 70–99)
Phosphorus: 2.3 mg/dL — ABNORMAL LOW (ref 2.5–4.6)
Potassium: 3.3 mmol/L — ABNORMAL LOW (ref 3.5–5.1)
Sodium: 135 mmol/L (ref 135–145)

## 2020-05-20 LAB — CBC
HCT: 31.6 % — ABNORMAL LOW (ref 39.0–52.0)
HCT: 30.5 % — ABNORMAL LOW (ref 39.0–52.0)
Hemoglobin: 10.4 g/dL — ABNORMAL LOW (ref 13.0–17.0)
Hemoglobin: 10 g/dL — ABNORMAL LOW (ref 13.0–17.0)
MCH: 30.1 pg (ref 26.0–34.0)
MCH: 29.8 pg (ref 26.0–34.0)
MCHC: 32.9 g/dL (ref 30.0–36.0)
MCHC: 32.8 g/dL (ref 30.0–36.0)
MCV: 91.6 fL (ref 80.0–100.0)
MCV: 90.8 fL (ref 80.0–100.0)
Platelets: 77 10*3/uL — ABNORMAL LOW (ref 150–400)
Platelets: 89 10*3/uL — ABNORMAL LOW (ref 150–400)
RBC: 3.45 MIL/uL — ABNORMAL LOW (ref 4.22–5.81)
RBC: 3.36 MIL/uL — ABNORMAL LOW (ref 4.22–5.81)
RDW: 14.6 % (ref 11.5–15.5)
RDW: 14.6 % (ref 11.5–15.5)
WBC: 13.1 10*3/uL — ABNORMAL HIGH (ref 4.0–10.5)
WBC: 11.4 10*3/uL — ABNORMAL HIGH (ref 4.0–10.5)
nRBC: 0.2 % (ref 0.0–0.2)
nRBC: 0.4 % — ABNORMAL HIGH (ref 0.0–0.2)

## 2020-05-20 LAB — COOXEMETRY PANEL
Carboxyhemoglobin: 1.4 % (ref 0.5–1.5)
Carboxyhemoglobin: 1.4 % (ref 0.5–1.5)
Methemoglobin: 1.1 % (ref 0.0–1.5)
Methemoglobin: 0.7 % (ref 0.0–1.5)
O2 Saturation: 54.2 %
O2 Saturation: 58.3 %
Total hemoglobin: 11.6 g/dL — ABNORMAL LOW (ref 12.0–16.0)
Total hemoglobin: 10.3 g/dL — ABNORMAL LOW (ref 12.0–16.0)

## 2020-05-20 LAB — SURGICAL PATHOLOGY

## 2020-05-20 LAB — GLUCOSE, CAPILLARY
Glucose-Capillary: 113 mg/dL — ABNORMAL HIGH (ref 70–99)
Glucose-Capillary: 130 mg/dL — ABNORMAL HIGH (ref 70–99)
Glucose-Capillary: 118 mg/dL — ABNORMAL HIGH (ref 70–99)
Glucose-Capillary: 187 mg/dL — ABNORMAL HIGH (ref 70–99)
Glucose-Capillary: 112 mg/dL — ABNORMAL HIGH (ref 70–99)
Glucose-Capillary: 149 mg/dL — ABNORMAL HIGH (ref 70–99)

## 2020-05-20 LAB — CULTURE, RESPIRATORY W GRAM STAIN
Culture: NORMAL
Special Requests: NORMAL

## 2020-05-20 MED ORDER — CLOPIDOGREL BISULFATE 75 MG PO TABS
75.0000 mg | ORAL_TABLET | Freq: Every day | ORAL | Status: DC
  Administered 2020-05-20 – 2020-05-29 (×10): 75 mg via ORAL
  Filled 2020-05-20 (×10): qty 1

## 2020-05-20 MED ORDER — ASPIRIN EC 81 MG PO TBEC
81.0000 mg | DELAYED_RELEASE_TABLET | Freq: Every day | ORAL | Status: DC
  Administered 2020-05-20 – 2020-05-21 (×2): 81 mg via ORAL
  Filled 2020-05-20 (×2): qty 1

## 2020-05-20 MED ORDER — GUAIFENESIN ER 600 MG PO TB12
600.0000 mg | ORAL_TABLET | Freq: Two times a day (BID) | ORAL | Status: DC
  Administered 2020-05-20 – 2020-05-29 (×18): 600 mg via ORAL
  Filled 2020-05-20 (×19): qty 1

## 2020-05-20 MED ORDER — COLCHICINE 0.6 MG PO TABS
0.6000 mg | ORAL_TABLET | Freq: Two times a day (BID) | ORAL | Status: DC
  Administered 2020-05-20 – 2020-05-29 (×19): 0.6 mg via ORAL
  Filled 2020-05-20 (×19): qty 1

## 2020-05-20 MED ORDER — MILRINONE LACTATE IN DEXTROSE 20-5 MG/100ML-% IV SOLN
0.1250 ug/kg/min | INTRAVENOUS | Status: DC
  Administered 2020-05-20: 0.125 ug/kg/min via INTRAVENOUS
  Filled 2020-05-20: qty 100

## 2020-05-20 MED ORDER — POTASSIUM CHLORIDE CRYS ER 20 MEQ PO TBCR
20.0000 meq | EXTENDED_RELEASE_TABLET | ORAL | Status: AC
  Administered 2020-05-20 (×3): 20 meq via ORAL
  Filled 2020-05-20 (×3): qty 1

## 2020-05-20 MED FILL — Magnesium Sulfate Inj 50%: INTRAMUSCULAR | Qty: 10 | Status: AC

## 2020-05-20 MED FILL — Potassium Chloride Inj 2 mEq/ML: INTRAVENOUS | Qty: 40 | Status: AC

## 2020-05-20 MED FILL — Heparin Sodium (Porcine) Inj 1000 Unit/ML: INTRAMUSCULAR | Qty: 30 | Status: AC

## 2020-05-20 NOTE — Progress Notes (Signed)
Inpatient Rehab Admissions Coordinator Note:   Per therapy recommendations, pt was screened for CIR candidacy by Estill Dooms, PT, DPT.  At this time we are recommending a CIR consult.  I will contact MD for an order.  Please contact me with questions.   Estill Dooms, PT, DPT [phone] 05/20/20 8:38 AM

## 2020-05-20 NOTE — Progress Notes (Signed)
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  Pt has been admitted with CAD involving the native coronary arteries with unstable angina. 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  Pt weaned from vent 05/18/20.    PT Comments    Pt making good progress with mobility. Able to amb short distance in room. Pt remains very motivated to return to prior functional level and continue to recommend CIR.    Follow Up Recommendations  CIR     Equipment Recommendations  Rolling walker with 5" wheels;3in1 (PT);Other (comment) (needs further assessment)    Recommendations for Other Services OT consult     Precautions / Restrictions Precautions Precautions: Sternal;Fall Precaution Booklet Issued: No    Mobility  Bed Mobility Overal bed mobility: Needs Assistance Bed Mobility: Rolling;Sidelying to Sit Rolling: Mod assist Sidelying to sit: Mod assist;+2 for safety/equipment;HOB elevated       General bed mobility comments: Assist to bring LE's off of bed and to elevate trunk into sitting  Transfers Overall transfer level: Needs assistance Equipment used: Rolling walker (2 wheeled) Transfers: Sit to/from Stand Sit to Stand: Min assist;+2 physical assistance;From elevated surface;Mod assist         General transfer comment: Assist to bring hips up from elevated bed with +2 min assist with Stedy. From recliner sit to stand with +2 mod assist to stand with walker. Verbal cues for hand to knees to rise from recliner  Ambulation/Gait Ambulation/Gait assistance: Min assist;+2 physical assistance;+2 safety/equipment Gait Distance (Feet): 5 Feet Assistive device: Rolling walker (2 wheeled) Gait  Pattern/deviations: Step-to pattern;Decreased step length - right;Decreased step length - left;Shuffle;Trunk flexed Gait velocity: decr Gait velocity interpretation: <1.31 ft/sec, indicative of household ambulator General Gait Details: Assist for balance and support. Followed with recliner to allow pt to sit when fatigued.   Stairs             Wheelchair Mobility    Modified Rankin (Stroke Patients Only)       Balance Overall balance assessment: Needs assistance Sitting-balance support: No upper extremity supported;Feet supported Sitting balance-Leahy Scale: Fair     Standing balance support: Bilateral upper extremity supported Standing balance-Leahy Scale: Poor Standing balance comment: walker and min assist for static standing                            Cognition Arousal/Alertness: Awake/alert Behavior During Therapy: WFL for tasks assessed/performed Overall Cognitive Status: Within Functional Limits for tasks assessed                                        Exercises      General Comments General comments (skin integrity, edema, etc.): HR to 110's with activity. Dyspnea 2-3/4 with activity. On RA after amb SpO2 87% briefly but returned to >92% and nurse left O2 off.      Pertinent Vitals/Pain Pain Assessment: 0-10 Pain Score: 4  Pain Location: chest Pain Descriptors / Indicators: Operative site guarding Pain Intervention(s): Limited activity within patient's tolerance;Monitored during session    Home Living  Prior Function            PT Goals (current goals can now be found in the care plan section) Acute Rehab PT Goals Patient Stated Goal: agreeable to rehab if needed Progress towards PT goals: Progressing toward goals    Frequency    Min 3X/week      PT Plan Current plan remains appropriate    Co-evaluation              AM-PAC PT "6 Clicks" Mobility   Outcome Measure  Help  needed turning from your back to your side while in a flat bed without using bedrails?: A Lot Help needed moving from lying on your back to sitting on the side of a flat bed without using bedrails?: A Lot Help needed moving to and from a bed to a chair (including a wheelchair)?: Total Help needed standing up from a chair using your arms (e.g., wheelchair or bedside chair)?: A Lot Help needed to walk in hospital room?: A Lot Help needed climbing 3-5 steps with a railing? : Total 6 Click Score: 10    End of Session Equipment Utilized During Treatment: Oxygen Activity Tolerance: Patient tolerated treatment well Patient left: in chair;with call bell/phone within reach;with family/visitor present;with nursing/sitter in room Nurse Communication: Mobility status;Need for lift equipment (nurses assisted with mobility) PT Visit Diagnosis: Muscle weakness (generalized) (M62.81);Other abnormalities of gait and mobility (R26.89)     Time: 1436-1500 PT Time Calculation (min) (ACUTE ONLY): 24 min  Charges:  $Therapeutic Activity: 23-37 mins                     [facility] PT Acute Rehabilitation Services Pager [phone] Office [phone]    Sean Hubbard 05/20/2020, 3:49 PM

## 2020-05-20 NOTE — Progress Notes (Signed)
Inpatient Rehab Admissions:  Inpatient Rehab Consult received.  I met with patient and his family (spouse and daughter) at the bedside for rehabilitation assessment and to discuss goals and expectations of an inpatient rehab admission.  They are all in agreement for CIR, if needed, when medically ready and bed available.  Will continue to follow.   Signed: Shann Medal, PT, DPT Admissions Coordinator [phone] 05/20/20  2:53 PM

## 2020-05-20 NOTE — Progress Notes (Signed)
[facility] DAILY ICU PROGRESS NOTE                   [address]            [place] [address]          [phone]   4 Days Post-Op Procedure(s) (LRB): CORONARY ARTERY BYPASS GRAFTING (CABG) X 3 (N/A) AORTIC VALVE REPLACEMENT (AVR) USING 25 MM EDWARDS INTUITY ELITE VALVE (N/A) TRANSESOPHAGEAL ECHOCARDIOGRAM (TEE) (N/A) INDOCYANINE GREEN FLUORESCENCE IMAGING (ICG) (N/A) ENDOVEIN HARVEST OF GREATER SAPHENOUS VEIN (Right)  Total Length of Stay:  LOS: 5 days   Subjective: Remains on milrinone, amio and cardizem gtts afib with elev rate, mostly 120's LFT's elevated significantly   Objective: Vital signs in last 24 hours: Temp:  [97.8 F (36.6 C)-99.7 F (37.6 C)] 98.4 F (36.9 C) (07/06 0715) Pulse Rate:  [45-106] 106 (07/06 0645) Cardiac Rhythm: Atrial fibrillation (07/06 0400) Resp:  [10-29] 21 (07/06 0715) BP: (103-143)/(59-96) 121/80 (07/06 0715) SpO2:  [94 %-100 %] 96 % (07/06 0715) Arterial Line BP: (93-156)/(61-88) 128/69 (07/05 1658) Weight:  [127.9 kg-128.2 kg] 128.2 kg (07/06 0630)  Filed Weights   05/19/20 0500 05/20/20 0625 05/20/20 0630  Weight: 126.9 kg 127.9 kg 128.2 kg    Weight change: 1 kg   Hemodynamic parameters for last 24 hours: PAP: (41-45)/(26-30) 41/30 CVP:  [15 mmHg] 15 mmHg  Intake/Output from previous day: 07/05 0701 - 07/06 0700 In: 2240.6 [P.O.:960; I.V.:1081.7; IV Piggyback:198.9] Out: 2812 [Urine:1770; Chest Tube:1042]  Intake/Output this shift: No intake/output data recorded.  Current Meds: Scheduled Meds: . aspirin EC  325 mg Oral Daily   Or  . aspirin  324 mg Per Tube Daily  . bisacodyl  10 mg Oral Daily   Or  . bisacodyl  10 mg Rectal Daily  . Chlorhexidine Gluconate Cloth  6 each Topical Daily  . docusate sodium  200 mg Oral Daily  . furosemide  40 mg Intravenous BID  . insulin aspart  0-15 Units Subcutaneous Q4H  . insulin detemir  10 Units Subcutaneous BID  . levalbuterol  1.25 mg Nebulization TID  .  lidocaine HCl (PF)  20 mL Intradermal Once  . mouth rinse  15 mL Mouth Rinse BID  . metoprolol tartrate  25 mg Per Tube BID   Or  . metoprolol tartrate  12.5 mg Oral BID  . pantoprazole  40 mg Oral Daily  . potassium chloride  20 mEq Oral Q4H  . sodium chloride flush  10-40 mL Intracatheter Q12H  . sodium chloride flush  3 mL Intravenous Q12H  . thiamine  100 mg Intravenous Daily   Continuous Infusions: . sodium chloride 10 mL/hr at 05/17/20 1200  . sodium chloride    . sodium chloride    . amiodarone 30 mg/hr (05/20/20 0700)  . diltiazem (CARDIZEM) infusion 5 mg/hr (05/20/20 0700)  . electrolyte-148 Stopped (05/18/20 2157)  . epinephrine Stopped (05/18/20 2238)  . lactated ringers    . lactated ringers Stopped (05/19/20 0557)  . milrinone 0.25 mcg/kg/min (05/20/20 0700)   PRN Meds:.sodium chloride, fentaNYL (SUBLIMAZE) injection, metoprolol tartrate, midazolam, morphine injection, ondansetron (ZOFRAN) IV, oxyCODONE, sodium chloride flush, sodium chloride flush, traMADol  General appearance: alert, cooperative, fatigued and no distress Heart: irregularly irregular rhythm Lungs: coarse BS Abdomen: minor distension, nontender Extremities: + edema Wound: dressings intact  Lab Results: CBC: Recent Labs    05/19/20 1630 05/20/20 0358  WBC 15.5* 13.1*  HGB 10.4* 10.4*  HCT 31.7* 31.6*  PLT 77* 77*   BMET:  Recent Labs    05/19/20 1630 05/20/20 0358  NA 138 137  K 3.8 3.3*  CL 104 102  CO2 24 25  GLUCOSE 123* 121*  BUN 29* 28*  CREATININE 0.94 0.84  CALCIUM 7.7* 7.6*    CMET: Lab Results  Component Value Date   WBC 13.1 (H) 05/20/2020   HGB 10.4 (L) 05/20/2020   HCT 31.6 (L) 05/20/2020   PLT 77 (L) 05/20/2020   GLUCOSE 121 (H) 05/20/2020   ALT 1,117 (H) 05/20/2020   AST 409 (H) 05/20/2020   NA 137 05/20/2020   K 3.3 (L) 05/20/2020   CL 102 05/20/2020   CREATININE 0.84 05/20/2020   BUN 28 (H) 05/20/2020   CO2 25 05/20/2020   INR 1.8 (H) 05/17/2020    HGBA1C 5.6 05/16/2020      PT/INR:  Recent Labs    05/17/20 1500  LABPROT 20.1*  INR 1.8*   Radiology: DG Chest Port 1 View  Result Date: 05/20/2020 CLINICAL DATA:  Aortic valve replacement EXAM: PORTABLE CHEST 1 VIEW COMPARISON:  Yesterday FINDINGS: Swan-Ganz catheter and sheath have been removed. Chest tubes and right PICC in place. Atelectatic opacity and stable cardiopericardial enlargement. No visible effusion or pneumothorax. IMPRESSION: Stable postoperative chest. No visible pneumothorax. Electronically Signed   By: Marnee Spring M.D.   On: 05/20/2020 06:16     Assessment/Plan: S/P Procedure(s) (LRB): CORONARY ARTERY BYPASS GRAFTING (CABG) X 3 (N/A) AORTIC VALVE REPLACEMENT (AVR) USING 25 MM EDWARDS INTUITY ELITE VALVE (N/A) TRANSESOPHAGEAL ECHOCARDIOGRAM (TEE) (N/A) INDOCYANINE GREEN FLUORESCENCE IMAGING (ICG) (N/A) ENDOVEIN HARVEST OF GREATER SAPHENOUS VEIN (Right)  1 BP mostly well controlled, conts with afib with RVR, cardizem gtts. Remains very volume overloaded, CO-Ox 54 on .25 milrinone. Lasix BID 40 IV- may need to consider lasix gtt. Consider Heart failure team consult. 2 conts with mod drainage all CT's- continue 3 significantly elevated LFT's, trend is slowly improving  - may need to stop amiodarone, passive congestion is playing a role.  4 renal fxn is remaining stable 5 replace K+ 6 H/H are stable 7 leukocytosis is trending down 8 glucose control is good 9 thrombocytopenia is slowly trending improved 9 pulm toilet and rehab as able, also getting nebs, sats good on RA   Rowe Clack PA-C Pager [phone] 05/20/2020 7:55 AM

## 2020-05-20 NOTE — Plan of Care (Signed)
°  Problem: Education: °Goal: Knowledge of General Education information will improve °Description: Including pain rating scale, medication(s)/side effects and non-pharmacologic comfort measures °Outcome: Progressing °  °Problem: Clinical Measurements: °Goal: Ability to maintain clinical measurements within normal limits will improve °Outcome: Progressing °Goal: Will remain free from infection °Outcome: Progressing °Goal: Diagnostic test results will improve °Outcome: Progressing °Goal: Respiratory complications will improve °Outcome: Progressing °Goal: Cardiovascular complication will be avoided °Outcome: Progressing °  °Problem: Activity: °Goal: Risk for activity intolerance will decrease °Outcome: Progressing °  °Problem: Coping: °Goal: Level of anxiety will decrease °Outcome: Progressing °  °Problem: Elimination: °Goal: Will not experience complications related to bowel motility °Outcome: Progressing °Goal: Will not experience complications related to urinary retention °Outcome: Progressing °  °Problem: Pain Managment: °Goal: General experience of comfort will improve °Outcome: Progressing °  °Problem: Safety: °Goal: Ability to remain free from injury will improve °Outcome: Progressing °  °

## 2020-05-20 NOTE — Progress Notes (Signed)
Patient ID: Sean Hubbard, male   DOB: [birth year], 79 y.o.   MRN: [id number] [facility] Evening Rounds:  Hemodynamically stable  Atrial fib with rate 100 on IV amio and cardizem 5/hr.  sats 98% 3L.  Good urine output.   Has been up in chair and ambulated.

## 2020-05-20 NOTE — Evaluation (Signed)
Clinical/Bedside Swallow Evaluation Patient Details  Name: Sean Hubbard MRN: [id number] Date of Birth: July 17, 1941  Today's Date: 05/20/2020 Time: SLP Start Time (ACUTE ONLY): 1102 SLP Stop Time (ACUTE ONLY): 1110 SLP Time Calculation (min) (ACUTE ONLY): 8 min  Past Medical History:  Past Medical History:  Diagnosis Date  . Aortic stenosis    a. TTE 04/2020: EF 55 to 60%, Gr1DD, nl RVSF & cavity size, mod dilated LA, mildly dilated RA, trivial mitral regurg, mod-sev aoritc stenosis w/ mean gradient of and VTI 1.01 cm  . CAD (coronary artery disease)    a. PCI x 4 to RCA in 1997 in [place]; b. LHC 05/15/2020: pLAD 95%, mLCx 50-60%, dominant RCA w/ 4 stents from the ostial region extending distally with sequential severe stenosis in the proximal RCA estimated at 95% followed by 90% stenosis, dRCA 80% prior to the bifurcation of RCA branches  . Gout   . Hyperlipemia   . Hypertension    Past Surgical History:  Past Surgical History:  Procedure Laterality Date  . AORTIC VALVE REPLACEMENT N/A 05/16/2020   Procedure: AORTIC VALVE REPLACEMENT (AVR) USING 25 MM EDWARDS INTUITY ELITE VALVE;  Surgeon: Linden Dolin, MD;  Location: [facility] OR;  Service: Open Heart Surgery;  Laterality: N/A;  . CARDIAC CATHETERIZATION    . CORONARY ANGIOPLASTY WITH STENT PLACEMENT  07/18/1996   x 4 to RCA in [place] Dr. Edilia Bo  . CORONARY ARTERY BYPASS GRAFT N/A 05/16/2020   Procedure: CORONARY ARTERY BYPASS GRAFTING (CABG) X 3;  Surgeon: Linden Dolin, MD;  Location: [facility] OR;  Service: Open Heart Surgery;  Laterality: N/A;  BILATERAL IMA  . ENDOVEIN HARVEST OF GREATER SAPHENOUS VEIN Right 05/16/2020   Procedure: ENDOVEIN HARVEST OF GREATER SAPHENOUS VEIN;  Surgeon: Linden Dolin, MD;  Location: [facility] OR;  Service: Open Heart Surgery;  Laterality: Right;  . LEFT HEART CATH AND CORONARY ANGIOGRAPHY N/A 05/15/2020   Procedure: LEFT HEART CATH AND CORONARY ANGIOGRAPHY;  Surgeon: Antonieta Iba, MD;  Location: [facility]  INVASIVE CV LAB;  Service: Cardiovascular;  Laterality: N/A;  . ROTATOR CUFF REPAIR Left   . 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  Pt has been admitted with CAD involving the native coronary arteries with unstable angina. Pt s/p CABG x 4 and aortic valve replacement, TEE, endovein harves of R greater saphenous vein on 7/2/21intubated until 05/18/20. Some difficulty managing secretions. Pt has required NTS suction.    Assessment / Plan / Recommendation Clinical Impression  Pt demosntrates no signs of aspiration in 3 oz water swallow or during self directed sips from a straw. He is able to masticate mixed texture and regular texture foods without difficulty. Pt may advance diet. RN to write to address dietary needs. Will sign off.  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  Pt has been admitted with CAD involving the native coronary arteries with unstable angina. Pt s/p CABG x 4 and aortic valve replacement, TEE, endovein harves of R greater saphenous vein on 7/2/21intubated until 05/18/20. Some difficulty managing secretions. Pt has  required NTS suction.  Type of Study: Bedside Swallow Evaluation Previous Swallow Assessment: none Diet Prior to this Study: Thin liquids Temperature Spikes Noted: No Respiratory Status: Nasal cannula History of Recent Intubation: No Behavior/Cognition: Alert;Cooperative;Pleasant mood Oral Cavity Assessment: Within Functional Limits Oral Care Completed by SLP: No Oral Cavity - Dentition: Adequate natural dentition Vision: Functional for self-feeding Self-Feeding Abilities: Total assist Patient Positioning: Upright in chair Baseline Vocal Quality: Normal Volitional Cough: Strong Volitional Swallow: Able to elicit    Oral/Motor/Sensory Function Overall Oral Motor/Sensory Function: Within functional limits   Ice Chips     Thin Liquid Thin Liquid: Within functional limits Presentation: Straw;Cup    Nectar Thick Nectar Thick Liquid: Not tested   Honey Thick Honey Thick Liquid: Not tested   Puree Puree: Within functional limits   Solid     Solid: Within functional limits     Harlon Ditty, MA CCC-SLP  Acute Rehabilitation Services Pager [phone] Office [phone]  Claudine Mouton 05/20/2020,11:19 AM

## 2020-05-20 NOTE — Op Note (Signed)
CARDIOTHORACIC SURGERY OPERATIVE NOTE  Date of Procedure: 05/16/2020 Preoperative Diagnosis: Severe 3-vessel Coronary Artery Disease, aortic valve stenosis  Postoperative Diagnosis: Same  Procedure:    Aortic valve replacement 25 mm Edwards Intuity bovine bioprosthetic  Coronary Artery Bypass Grafting x 4  Left Internal Mammary Artery to Distal Left Anterior Descending Coronary Artery; Saphenous Vein Graft to Posterior Descending Coronary Artery; Saphenous Vein Graft to Obtuse Marginal Branch of Left Circumflex Coronary Artery;  pedicled right internal mammary artery graft to right posterior lateral coronary artery graft; Endoscopic Vein Harvest from right thigh and Lower Leg Bilateral internal mammary artery harvesting Multilevel rib block with Exparel solution Completion graft surveillance with indocyanine green fluorescence imaging  Surgeon: B.  Lorayne Marek, MD  Assistant: Webb Laws PA-C  Anesthesia: General  Operative Findings:  Preserved left ventricular systolic function  Good quality  internal mammary artery conduits  Good quality saphenous vein conduit  Good quality target vessels for grafting    BRIEF CLINICAL NOTE AND INDICATIONS FOR SURGERY  79 year old gentleman presented with a brief history of fatigue and dizziness.  His work-up demonstrated moderate to severe aortic valve disease and he underwent left heart catheterization showing multivessel coronary disease.  He is referred for coronary artery bypass grafting and aortic valve replacement   DETAILS OF THE OPERATIVE PROCEDURE  Preparation:  The patient is brought to the operating room on the above mentioned date and central monitoring was established by the anesthesia team including placement of Swan-Ganz catheter and radial arterial line. The patient is placed in the supine position on the operating table.  Intravenous antibiotics are administered. General endotracheal anesthesia is induced uneventfully. A  Foley catheter is placed.  Baseline transesophageal echocardiogram was performed.  Findings were notable for thickened myocardium and preserved left ventricular function; there is severe aortic valve stenosis  The patient's chest, abdomen, both groins, and both lower extremities are prepared and draped in a sterile manner. A time out procedure is performed.   Surgical Approach and Conduit Harvest:  A median sternotomy incision was performed and the left internal mammary artery is dissected from the chest wall and prepared for bypass grafting. The left internal mammary artery is notably good quality conduit. Simultaneously, the greater saphenous vein is obtained from the patient's right thigh using endoscopic vein harvest technique. The saphenous vein is notably good quality conduit. After removal of the saphenous vein, the small surgical incisions in the lower extremity are closed with absorbable suture.  Next, attention is turned to the right hemithorax of the right internal mammary artery and its pedicle mobilized in standard fashion.  Prior to dividing the pedicle distally full dose heparin is given intravenously.  Following systemic heparinization, the internal mammary artery conduits were transected distally noted to have excellent flow.  Grafts were treated with a solution of papaverine.  Multivessel rib block was undertaken through the open sternotomy into the exposed intercostal spaces with Exparel solution  Extracorporeal Cardiopulmonary Bypass and Myocardial Protection:  The pericardium is opened. The ascending aorta is mildly dilated in appearance. The ascending aorta and the right atrium are cannulated for cardiopulmonary bypass.  Adequate heparinization is verified.   A retrograde cardioplegia cannula is placed through the right atrium into the coronary sinus.  Cardiopulmonary bypass was begun and the surface of the heart is inspected. Distal target vessels are selected for coronary  artery bypass grafting. A cardioplegia cannula is placed in the ascending aorta.  The patient is cooled to 32C systemic temperature.  The aortic cross  clamp is applied and cold blood cardioplegia is delivered initially in an antegrade fashion through the aortic root.  Supplemental cardioplegia is given retrograde through the coronary sinus catheter.  Iced saline slush is applied for topical hypothermia.  The initial cardioplegic arrest is rapid with early diastolic arrest.  Repeat doses of cardioplegia are administered intermittently throughout the entire cross clamp portion of the operation through the aortic root, through the coronary sinus catheter, and through subsequently placed vein grafts in order to maintain completely flat electrocardiogram.   Coronary Artery Bypass Grafting:   The  posterior descending branch of the right coronary artery was grafted using a reversed saphenous vein graft in an end-to-side fashion.  At the site of distal anastomosis the target vessel was good quality and measured approximately 1.5 mm in diameter.  The  obtuse marginal branch of the left circumflex coronary artery was grafted using a reversed saphenous vein graft in an end-to-side fashion.  At the site of distal anastomosis the target vessel was good quality and measured approximately 1.5 mm in diameter.  The posterior lateral branch of the right coronary artery was grafted in and end-to-side fashion using the pedicled right internal mammary artery graft artery.  At the site of distal anastomosis the target vessel was good quality and measured approximately 1.5 mm in diameter. Anastomotic patency and runoff was confirmed with indocyanine green fluorescence imaging (SPY).  The distal left anterior coronary artery was grafted with the left internal mammary artery in an end-to-side fashion.  At the site of distal anastomosis the target vessel was good quality and measured approximately 1.5 mm in diameter.  Anastomotic patency and runoff was confirmed with indocyanine green fluorescence imaging (SPY).  Aortic valve replacement: Once all of the coronary distals had been performed CO2 was blown into the field.  The aorta was opened transversely approximately 1 cm above the sinotubular junction.  The aortic valve was trileaflet and heavily calcified.  The 3 leaflets were excised sharply and the annulus was debrided carefully.  Intuity valve sizers were brought onto the field and sized the annulus of a 25 mm prosthesis.  This was then put paired on the back table and inserted in rapid deployment fashion as per manufacturer guidelines.  The valve seated well.  The aorta was closed in layers.  All proximal vein graft anastomoses were placed directly to the ascending aorta prior to removal of the aortic cross clamp.  De-airing procedures were performed and the aortic cross-clamp was removed   Procedure Completion:  All proximal and distal coronary anastomoses were inspected for hemostasis and appropriate graft orientation. Epicardial pacing wires are fixed to the right ventricular outflow tract and to the right atrial appendage. The patient is rewarmed to 37C temperature. The patient is weaned and disconnected from cardiopulmonary bypass.  The patient's rhythm at separation from bypass was sinus bradycardia.   Followup transesophageal echocardiogram performed after separation from bypass revealed  no changes from the preoperative exam.  The aortic and venous cannula were removed uneventfully. Protamine was administered to reverse the anticoagulation. The mediastinum and pleural space were inspected for hemostasis and irrigated with saline solution. The mediastinum and bilateral pleural spaces were drained using fluted chest tubes placed through separate stab incisions inferiorly.  The soft tissues anterior to the aorta were reapproximated loosely. The sternum is closed with double strength sternal wire. The soft  tissues anterior to the sternum were closed in multiple layers and the skin is closed with a running subcuticular skin closure.  The post-bypass portion of the operation was notable for stable rhythm and hemodynamics.    Disposition:  The patient tolerated the procedure well and is transported to the surgical intensive care in stable condition. There are no intraoperative complications. All sponge instrument and needle counts are verified correct at completion of the operation.    Brantley Fling, MD 05/20/2020 7:55 AM

## 2020-05-21 LAB — CBC
HCT: 31.8 % — ABNORMAL LOW (ref 39.0–52.0)
HCT: 31.7 % — ABNORMAL LOW (ref 39.0–52.0)
Hemoglobin: 10.1 g/dL — ABNORMAL LOW (ref 13.0–17.0)
Hemoglobin: 10.3 g/dL — ABNORMAL LOW (ref 13.0–17.0)
MCH: 29.4 pg (ref 26.0–34.0)
MCH: 29.2 pg (ref 26.0–34.0)
MCHC: 31.8 g/dL (ref 30.0–36.0)
MCHC: 32.5 g/dL (ref 30.0–36.0)
MCV: 92.4 fL (ref 80.0–100.0)
MCV: 89.8 fL (ref 80.0–100.0)
Platelets: 104 10*3/uL — ABNORMAL LOW (ref 150–400)
Platelets: 125 10*3/uL — ABNORMAL LOW (ref 150–400)
RBC: 3.44 MIL/uL — ABNORMAL LOW (ref 4.22–5.81)
RBC: 3.53 MIL/uL — ABNORMAL LOW (ref 4.22–5.81)
RDW: 14.8 % (ref 11.5–15.5)
RDW: 14.6 % (ref 11.5–15.5)
WBC: 10.6 10*3/uL — ABNORMAL HIGH (ref 4.0–10.5)
WBC: 10.5 10*3/uL (ref 4.0–10.5)
nRBC: 0.2 % (ref 0.0–0.2)
nRBC: 0.2 % (ref 0.0–0.2)

## 2020-05-21 LAB — RENAL FUNCTION PANEL
Albumin: 2.7 g/dL — ABNORMAL LOW (ref 3.5–5.0)
Anion gap: 8 (ref 5–15)
BUN: 21 mg/dL (ref 8–23)
CO2: 28 mmol/L (ref 22–32)
Calcium: 7.5 mg/dL — ABNORMAL LOW (ref 8.9–10.3)
Chloride: 98 mmol/L (ref 98–111)
Creatinine, Ser: 0.77 mg/dL (ref 0.61–1.24)
GFR calc Af Amer: >60 mL/min (ref >60)
GFR calc non Af Amer: >60 mL/min (ref >60)
Glucose, Bld: 218 mg/dL — ABNORMAL HIGH (ref 70–99)
Phosphorus: 2.8 mg/dL (ref 2.5–4.6)
Potassium: 3.5 mmol/L (ref 3.5–5.1)
Sodium: 134 mmol/L — ABNORMAL LOW (ref 135–145)

## 2020-05-21 LAB — COMPREHENSIVE METABOLIC PANEL
ALT: 708 U/L — ABNORMAL HIGH (ref 0–44)
AST: 133 U/L — ABNORMAL HIGH (ref 15–41)
Albumin: 2.7 g/dL — ABNORMAL LOW (ref 3.5–5.0)
Alkaline Phosphatase: 67 U/L (ref 38–126)
Anion gap: 8 (ref 5–15)
BUN: 22 mg/dL (ref 8–23)
CO2: 27 mmol/L (ref 22–32)
Calcium: 7.6 mg/dL — ABNORMAL LOW (ref 8.9–10.3)
Chloride: 102 mmol/L (ref 98–111)
Creatinine, Ser: 0.8 mg/dL (ref 0.61–1.24)
GFR calc Af Amer: >60 mL/min (ref >60)
GFR calc non Af Amer: >60 mL/min (ref >60)
Glucose, Bld: 113 mg/dL — ABNORMAL HIGH (ref 70–99)
Potassium: 3.2 mmol/L — ABNORMAL LOW (ref 3.5–5.1)
Sodium: 137 mmol/L (ref 135–145)
Total Bilirubin: 1.1 mg/dL (ref 0.3–1.2)
Total Protein: 4.9 g/dL — ABNORMAL LOW (ref 6.5–8.1)

## 2020-05-21 LAB — CULTURE, RESPIRATORY W GRAM STAIN: Culture: NORMAL

## 2020-05-21 LAB — COOXEMETRY PANEL
Carboxyhemoglobin: 1.3 % (ref 0.5–1.5)
Methemoglobin: 0.8 % (ref 0.0–1.5)
O2 Saturation: 58.7 %
Total hemoglobin: 10.6 g/dL — ABNORMAL LOW (ref 12.0–16.0)

## 2020-05-21 LAB — GLUCOSE, CAPILLARY
Glucose-Capillary: 107 mg/dL — ABNORMAL HIGH (ref 70–99)
Glucose-Capillary: 107 mg/dL — ABNORMAL HIGH (ref 70–99)
Glucose-Capillary: 115 mg/dL — ABNORMAL HIGH (ref 70–99)
Glucose-Capillary: 183 mg/dL — ABNORMAL HIGH (ref 70–99)
Glucose-Capillary: 128 mg/dL — ABNORMAL HIGH (ref 70–99)

## 2020-05-21 MED ORDER — FUROSEMIDE 10 MG/ML IJ SOLN
60.0000 mg | Freq: Two times a day (BID) | INTRAMUSCULAR | Status: DC
  Administered 2020-05-21 – 2020-05-25 (×9): 60 mg via INTRAVENOUS
  Filled 2020-05-21 (×9): qty 6

## 2020-05-21 MED ORDER — POTASSIUM CHLORIDE CRYS ER 20 MEQ PO TBCR: 20.0000 meq | EXTENDED_RELEASE_TABLET | ORAL | Status: DC

## 2020-05-21 MED ORDER — POTASSIUM CHLORIDE CRYS ER 20 MEQ PO TBCR
20.0000 meq | EXTENDED_RELEASE_TABLET | ORAL | Status: AC
  Administered 2020-05-21 (×3): 20 meq via ORAL
  Filled 2020-05-21 (×3): qty 1

## 2020-05-21 MED ORDER — APIXABAN 5 MG PO TABS
5.0000 mg | ORAL_TABLET | Freq: Two times a day (BID) | ORAL | Status: DC
  Administered 2020-05-21 – 2020-05-29 (×17): 5 mg via ORAL
  Filled 2020-05-21 (×17): qty 1

## 2020-05-21 MED ORDER — METOPROLOL TARTRATE 25 MG/10 ML ORAL SUSPENSION
25.0000 mg | Freq: Three times a day (TID) | ORAL | Status: DC
  Administered 2020-05-28: 25 mg
  Filled 2020-05-21: qty 10

## 2020-05-21 MED ORDER — ALLOPURINOL 100 MG PO TABS
100.0000 mg | ORAL_TABLET | Freq: Every day | ORAL | Status: DC
  Administered 2020-05-21 – 2020-05-29 (×9): 100 mg via ORAL
  Filled 2020-05-21 (×9): qty 1

## 2020-05-21 MED ORDER — METOPROLOL TARTRATE 25 MG PO TABS
25.0000 mg | ORAL_TABLET | Freq: Three times a day (TID) | ORAL | Status: DC
  Administered 2020-05-21 – 2020-05-29 (×24): 25 mg via ORAL
  Filled 2020-05-21 (×24): qty 1

## 2020-05-21 NOTE — Progress Notes (Signed)
ANTICOAGULATION CONSULT NOTE - Initial Consult  Pharmacy Consult for apixiban  Indication: atrial fibrillation  No Known Allergies  Patient Measurements: Height: 5' 8.5" (174 cm) Weight: 127 kg (279 lb 15.8 oz) IBW/kg (Calculated) : 69.55   Vital Signs: Temp: 96.9 F (36.1 C) (07/07 0731) Temp Source: Oral (07/07 0731) BP: 138/76 (07/07 0815) Pulse Rate: 90 (07/07 0815)  Labs: Recent Labs    05/20/20 0358 05/20/20 0358 05/20/20 1637 05/21/20 0409 05/21/20 0445  HGB 10.4*   < > 10.0*  --  10.1*  HCT 31.6*  --  30.5*  --  31.8*  PLT 77*  --  89*  --  104*  CREATININE 0.84  --  0.92 0.80  --    < > = values in this interval not displayed.    Estimated Creatinine Clearance: 102.9 mL/min (by C-G formula based on SCr of 0.8 mg/dL).   Medical History: Past Medical History:  Diagnosis Date  . Aortic stenosis    a. TTE 04/2020: EF 55 to 60%, Gr1DD, nl RVSF & cavity size, mod dilated LA, mildly dilated RA, trivial mitral regurg, mod-sev aoritc stenosis w/ mean gradient of and VTI 1.01 cm  . CAD (coronary artery disease)    a. PCI x 4 to RCA in 1997 in [place]; b. LHC 05/15/2020: pLAD 95%, mLCx 50-60%, dominant RCA w/ 4 stents from the ostial region extending distally with sequential severe stenosis in the proximal RCA estimated at 95% followed by 90% stenosis, dRCA 80% prior to the bifurcation of RCA branches  . Gout   . Hyperlipemia   . Hypertension     Assessment: 38 YOM with s/p CABG + AVR 7/2 replacement.PMH significant for CAD, HTN, and HLD. EF of 55-60 on 6/24. Current H&H stable, Plt 104 trending post op. Currently on IV amio 30mg /hr, IV dilt 7.5mg /hr for Afib. Current DVT ppx with SCDs. Pharmacy consulted to dose apixiban for indication of Afib. LFTs elevated now improving.  Goal of Therapy:  Monitor platelets by anticoagulation protocol: Yes   Plan:  Apixiban 5MG  BID Monitor for signs and symptoms of bleeding  Scotland Korver 05/21/2020,8:44 AM

## 2020-05-21 NOTE — TOC Progression Note (Signed)
Transition of Care [facility]) - Progression Note    Patient Details  Name: WM SAHAGUN MRN: [id number] Date of Birth: 20-Sep-1941  Transition of Care [facility]) CM/SW Contact  Leone Haven, RN Phone Number: 05/21/2020, 5:43 PM  Clinical Narrative:    Transfer from 2H late evening, POD5 CABG x 3, AVR, afib with RVR on amio drip and cardiazem.  Will be on eliquis which will be new, will need benefit check.        Expected Discharge Plan and Services                                                 Social Determinants of Health (SDOH) Interventions    Readmission Risk Interventions No flowsheet data found.

## 2020-05-21 NOTE — Plan of Care (Signed)
Problem: Education: Goal: Knowledge of General Education information will improve Description: Including pain rating scale, medication(s)/side effects and non-pharmacologic comfort measures 05/21/2020 0530 by Danella Penton, RN Outcome: Progressing 05/20/2020 2358 by Danella Penton, RN Outcome: Progressing   Problem: Health Behavior/Discharge Planning: Goal: Ability to manage health-related needs will improve Outcome: Progressing   Problem: Clinical Measurements: Goal: Ability to maintain clinical measurements within normal limits will improve 05/21/2020 0530 by Danella Penton, RN Outcome: Progressing 05/20/2020 2358 by Danella Penton, RN Outcome: Progressing Goal: Will remain free from infection 05/21/2020 0530 by Danella Penton, RN Outcome: Progressing 05/20/2020 2358 by Danella Penton, RN Outcome: Progressing Goal: Diagnostic test results will improve 05/21/2020 0530 by Danella Penton, RN Outcome: Progressing 05/20/2020 2358 by Danella Penton, RN Outcome: Progressing Goal: Respiratory complications will improve 05/21/2020 0530 by Danella Penton, RN Outcome: Progressing 05/20/2020 2358 by Danella Penton, RN Outcome: Progressing Goal: Cardiovascular complication will be avoided 05/21/2020 0530 by Danella Penton, RN Outcome: Progressing 05/20/2020 2358 by Danella Penton, RN Outcome: Progressing   Problem: Activity: Goal: Risk for activity intolerance will decrease 05/21/2020 0530 by Danella Penton, RN Outcome: Progressing 05/20/2020 2358 by Danella Penton, RN Outcome: Progressing   Problem: Nutrition: Goal: Adequate nutrition will be maintained 05/21/2020 0530 by Danella Penton, RN Outcome: Progressing 05/20/2020 2358 by Danella Penton, RN Outcome: Not Progressing   Problem: Coping: Goal: Level of anxiety will decrease 05/21/2020 0530 by Danella Penton, RN Outcome: Progressing 05/20/2020 2358 by Danella Penton, RN Outcome: Progressing   Problem:  Elimination: Goal: Will not experience complications related to bowel motility 05/21/2020 0530 by Danella Penton, RN Outcome: Progressing 05/20/2020 2358 by Danella Penton, RN Outcome: Progressing Goal: Will not experience complications related to urinary retention 05/21/2020 0530 by Danella Penton, RN Outcome: Progressing 05/20/2020 2358 by Danella Penton, RN Outcome: Progressing   Problem: Pain Managment: Goal: General experience of comfort will improve 05/21/2020 0530 by Danella Penton, RN Outcome: Progressing 05/20/2020 2358 by Danella Penton, RN Outcome: Progressing   Problem: Safety: Goal: Ability to remain free from injury will improve 05/21/2020 0530 by Danella Penton, RN Outcome: Progressing 05/20/2020 2358 by Danella Penton, RN Outcome: Progressing   Problem: Skin Integrity: Goal: Risk for impaired skin integrity will decrease Outcome: Progressing   Problem: Education: Goal: Will demonstrate proper wound care and an understanding of methods to prevent future damage Outcome: Progressing Goal: Knowledge of disease or condition will improve Outcome: Progressing Goal: Knowledge of the prescribed therapeutic regimen will improve Outcome: Progressing   Problem: Activity: Goal: Risk for activity intolerance will decrease Outcome: Progressing   Problem: Cardiac: Goal: Will achieve and/or maintain hemodynamic stability Outcome: Progressing   Problem: Clinical Measurements: Goal: Postoperative complications will be avoided or minimized Outcome: Progressing   Problem: Respiratory: Goal: Respiratory status will improve Outcome: Progressing   Problem: Skin Integrity: Goal: Wound healing without signs and symptoms of infection Outcome: Progressing   Problem: Urinary Elimination: Goal: Ability to achieve and maintain adequate renal perfusion and functioning will improve Outcome: Progressing   Problem: Education: Goal: Will demonstrate proper wound care and an  understanding of methods to prevent future damage Outcome: Progressing Goal: Knowledge of disease or condition will improve Outcome: Progressing Goal: Knowledge of the prescribed therapeutic regimen will improve Outcome: Progressing   Problem: Activity: Goal: Risk for activity intolerance will decrease Outcome: Progressing   Problem: Cardiac: Goal: Will achieve and/or maintain  hemodynamic stability Outcome: Progressing   Problem: Clinical Measurements: Goal: Postoperative complications will be avoided or minimized Outcome: Progressing   Problem: Respiratory: Goal: Respiratory status will improve Outcome: Progressing   Problem: Skin Integrity: Goal: Wound healing without signs and symptoms of infection Outcome: Progressing Goal: Risk for impaired skin integrity will decrease Outcome: Progressing   Problem: Urinary Elimination: Goal: Ability to achieve and maintain adequate renal perfusion and functioning will improve Outcome: Progressing   Problem: Education: Goal: Will demonstrate proper wound care and an understanding of methods to prevent future damage Outcome: Progressing Goal: Knowledge of disease or condition will improve Outcome: Progressing Goal: Knowledge of the prescribed therapeutic regimen will improve Outcome: Progressing   Problem: Activity: Goal: Risk for activity intolerance will decrease Outcome: Progressing   Problem: Cardiac: Goal: Will achieve and/or maintain hemodynamic stability Outcome: Progressing   Problem: Clinical Measurements: Goal: Postoperative complications will be avoided or minimized Outcome: Progressing   Problem: Respiratory: Goal: Respiratory status will improve Outcome: Progressing

## 2020-05-21 NOTE — Progress Notes (Signed)
CARDIAC REHAB PHASE I   PRE:  Rate/Rhythm: 77 SR with PACs    BP: sitting 124/94    SaO2: 98 RA  MODE:  Ambulation: 50 ft   POST:  Rate/Rhythm: 115 afib?    BP: sitting 124/86     SaO2: 94 RA, 96 RA  Pt anxious to ambulate. Mod assist x2 to get to EOB. Pt holds breath with any mobility. C/o dizziness. Sat on EOB for several minutes. Able to stand, strong legs. Pt c/o dizziness standing. Able to walk with EVA, assist x2, gait belt. Unsteady initially. Stopped to rest x1. Suddenly at 50 ft pt significantly dizzy and wanted to sit. HR elevated to 115, ? aifb versus ST with many PACs. Decreased dizziness with sitting, HR decreased back to 80s, P waves present. BP 136/78. Pt still 4/10 dizziness and wanted to be rolled back to bed.  Able to transfer to bed. Still holds breath even with coaching. VSS. Will f/u. He does have history of vestibular therapy. [phone]  Harriet Masson CES, ACSM 05/21/2020 3:15 PM

## 2020-05-21 NOTE — Plan of Care (Signed)
Problem: Education: Goal: Knowledge of General Education information will improve Description: Including pain rating scale, medication(s)/side effects and non-pharmacologic comfort measures Outcome: Progressing   Problem: Health Behavior/Discharge Planning: Goal: Ability to manage health-related needs will improve Outcome: Progressing   Problem: Clinical Measurements: Goal: Ability to maintain clinical measurements within normal limits will improve Outcome: Progressing Goal: Will remain free from infection Outcome: Progressing Goal: Diagnostic test results will improve Outcome: Progressing Goal: Respiratory complications will improve Outcome: Progressing Goal: Cardiovascular complication will be avoided Outcome: Progressing   Problem: Activity: Goal: Risk for activity intolerance will decrease Outcome: Progressing   Problem: Nutrition: Goal: Adequate nutrition will be maintained Outcome: Progressing   Problem: Coping: Goal: Level of anxiety will decrease Outcome: Progressing   Problem: Elimination: Goal: Will not experience complications related to bowel motility Outcome: Progressing Goal: Will not experience complications related to urinary retention Outcome: Progressing   Problem: Pain Managment: Goal: General experience of comfort will improve Outcome: Progressing   Problem: Safety: Goal: Ability to remain free from injury will improve Outcome: Progressing   Problem: Skin Integrity: Goal: Risk for impaired skin integrity will decrease Outcome: Progressing   Problem: Education: Goal: Will demonstrate proper wound care and an understanding of methods to prevent future damage Outcome: Progressing Goal: Knowledge of disease or condition will improve Outcome: Progressing Goal: Knowledge of the prescribed therapeutic regimen will improve Outcome: Progressing Goal: Individualized Educational Video(s) Outcome: Progressing   Problem: Activity: Goal: Risk for  activity intolerance will decrease Outcome: Progressing   Problem: Cardiac: Goal: Will achieve and/or maintain hemodynamic stability Outcome: Progressing   Problem: Clinical Measurements: Goal: Postoperative complications will be avoided or minimized Outcome: Progressing   Problem: Respiratory: Goal: Respiratory status will improve Outcome: Progressing   Problem: Skin Integrity: Goal: Wound healing without signs and symptoms of infection Outcome: Progressing Goal: Risk for impaired skin integrity will decrease Outcome: Progressing   Problem: Urinary Elimination: Goal: Ability to achieve and maintain adequate renal perfusion and functioning will improve Outcome: Progressing   Problem: Education: Goal: Will demonstrate proper wound care and an understanding of methods to prevent future damage Outcome: Progressing Goal: Knowledge of disease or condition will improve Outcome: Progressing Goal: Knowledge of the prescribed therapeutic regimen will improve Outcome: Progressing Goal: Individualized Educational Video(s) Outcome: Progressing   Problem: Activity: Goal: Risk for activity intolerance will decrease Outcome: Progressing   Problem: Cardiac: Goal: Will achieve and/or maintain hemodynamic stability Outcome: Progressing   Problem: Clinical Measurements: Goal: Postoperative complications will be avoided or minimized Outcome: Progressing   Problem: Respiratory: Goal: Respiratory status will improve Outcome: Progressing   Problem: Skin Integrity: Goal: Wound healing without signs and symptoms of infection Outcome: Progressing Goal: Risk for impaired skin integrity will decrease Outcome: Progressing   Problem: Urinary Elimination: Goal: Ability to achieve and maintain adequate renal perfusion and functioning will improve Outcome: Progressing   Problem: Education: Goal: Will demonstrate proper wound care and an understanding of methods to prevent future  damage Outcome: Progressing Goal: Knowledge of disease or condition will improve Outcome: Progressing Goal: Knowledge of the prescribed therapeutic regimen will improve Outcome: Progressing Goal: Individualized Educational Video(s) Outcome: Progressing   Problem: Activity: Goal: Risk for activity intolerance will decrease Outcome: Progressing   Problem: Cardiac: Goal: Will achieve and/or maintain hemodynamic stability Outcome: Progressing   Problem: Clinical Measurements: Goal: Postoperative complications will be avoided or minimized Outcome: Progressing   Problem: Respiratory: Goal: Respiratory status will improve Outcome: Progressing   Problem: Skin Integrity: Goal: Wound healing without signs and   symptoms of infection Outcome: Progressing Goal: Risk for impaired skin integrity will decrease Outcome: Progressing   Problem: Urinary Elimination: Goal: Ability to achieve and maintain adequate renal perfusion and functioning will improve Outcome: Progressing

## 2020-05-21 NOTE — Progress Notes (Signed)
5 Days Post-Op Procedure(s) (LRB): CORONARY ARTERY BYPASS GRAFTING (CABG) X 3 (N/A) AORTIC VALVE REPLACEMENT (AVR) USING 25 MM EDWARDS INTUITY ELITE VALVE (N/A) TRANSESOPHAGEAL ECHOCARDIOGRAM (TEE) (N/A) INDOCYANINE GREEN FLUORESCENCE IMAGING (ICG) (N/A) ENDOVEIN HARVEST OF GREATER SAPHENOUS VEIN (Right) Subjective: dizzy  Objective: Vital signs in last 24 hours: Temp:  [96.9 F (36.1 C)-99.1 F (37.3 C)] 96.9 F (36.1 C) (07/07 0731) Pulse Rate:  [48-107] 99 (07/07 0728) Cardiac Rhythm: Atrial fibrillation (07/07 0400) Resp:  [9-25] 25 (07/07 0728) BP: (88-145)/(58-99) 138/78 (07/07 0700) SpO2:  [90 %-100 %] 93 % (07/07 0728) Weight:  [742 kg] 127 kg (07/07 0630)  Hemodynamic parameters for last 24 hours:    Intake/Output from previous day: 07/06 0701 - 07/07 0700 In: 1371.9 [P.O.:720; I.V.:651.9] Out: 3400 [Urine:2610; Chest Tube:790] Intake/Output this shift: No intake/output data recorded.  General appearance: alert and cooperative Neurologic: intact Heart: irregularly irregular rhythm Lungs: clear to auscultation bilaterally Abdomen: soft, non-tender; bowel sounds normal; no masses,  no organomegaly Extremities: edema 2+ Wound: dressed, dry  Lab Results: Recent Labs    05/20/20 1637 05/21/20 0445  WBC 11.4* 10.6*  HGB 10.0* 10.1*  HCT 30.5* 31.8*  PLT 89* 104*   BMET:  Recent Labs    05/20/20 1637 05/21/20 0409  NA 135 137  K 3.3* 3.2*  CL 99 102  CO2 26 27  GLUCOSE 240* 113*  BUN 25* 22  CREATININE 0.92 0.80  CALCIUM 7.5* 7.6*    PT/INR: No results for input(s): LABPROT, INR in the last 72 hours. ABG    Component Value Date/Time   PHART 7.459 (H) 05/19/2020 1218   HCO3 27.6 05/19/2020 1218   TCO2 29 05/19/2020 1218   ACIDBASEDEF 5.0 (H) 05/17/2020 0812   O2SAT 58.7 05/21/2020 0409   CBG (last 3)  Recent Labs    05/20/20 2326 05/21/20 0413 05/21/20 0708  GLUCAP 149* 107* 107*    Assessment/Plan: S/P Procedure(s) (LRB): CORONARY  ARTERY BYPASS GRAFTING (CABG) X 3 (N/A) AORTIC VALVE REPLACEMENT (AVR) USING 25 MM EDWARDS INTUITY ELITE VALVE (N/A) TRANSESOPHAGEAL ECHOCARDIOGRAM (TEE) (N/A) INDOCYANINE GREEN FLUORESCENCE IMAGING (ICG) (N/A) ENDOVEIN HARVEST OF GREATER SAPHENOUS VEIN (Right) Mobilize Diuresis Plan for transfer to step-down: see transfer orders   LOS: 6 days    Linden Dolin 05/21/2020

## 2020-05-21 NOTE — Evaluation (Signed)
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  Pt has been admitted with CAD involving the native coronary arteries with unstable angina. 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  Pt weaned from vent 05/18/20.   Clinical Impression   This 79 y/o male presents with the above. PTA pt reports being very independent with ADL, iADL and functional mobility. Pt very pleasant and motivated to work with therapies. Pt tolerating functional mobility using eva walker and two person assist in room and a short distance into hallway - overall requiring minA (+2) for mobility tasks throughout. He currently requires up to totalA for LB/toileting ADL, minA for seated UB ADL. HR up to 117 with standing activity. Pt fatigued with activity but is motivated to return to his PLOF. He will benefit from continued acute OT services and feel he is an excellent candidate for CIR therapy services after discharge to progress pt towards his PLOF.     Follow Up Recommendations  CIR    Equipment Recommendations  Tub/shower seat;Other (comment) (TBD in next venue)           Precautions / Restrictions Precautions Precautions: Sternal;Fall Precaution Booklet Issued: No Restrictions Weight Bearing Restrictions: Yes (Sternal Precautions) Other Position/Activity Restrictions: sternal precautions       Mobility Bed Mobility               General bed mobility comments: OOB in recliner upon arrival   Transfers Overall transfer level: Needs assistance Equipment used: 4-wheeled walker (eva ) Transfers: Sit to/from Stand Sit to Stand: Min assist;+2 physical assistance         General transfer comment: assist to elevate  hips and steady at eva walker, VCs for hand placement; performed x2     Balance Overall balance assessment: Needs assistance Sitting-balance support: No upper extremity supported;Feet supported Sitting balance-Leahy Scale: Fair     Standing balance support: Bilateral upper extremity supported Standing balance-Leahy Scale: Poor Standing balance comment: walker and min assist for static standing                           ADL either performed or assessed with clinical judgement   ADL Overall ADL's : Needs assistance/impaired Eating/Feeding: Modified independent;Sitting   Grooming: Wash/dry face;Set up;Sitting   Upper Body Bathing: Minimal assistance;Sitting   Lower Body Bathing: Moderate assistance;+2 for safety/equipment;+2 for physical assistance;Sitting/lateral leans;Sit to/from stand   Upper Body Dressing : Minimal assistance;Sitting   Lower Body Dressing: Maximal assistance;+2 for physical assistance;Sit to/from stand Lower Body Dressing Details (indicate cue type and reason): assist to don socks today Toilet Transfer: Minimal assistance;+2 for physical assistance;+2 for safety/equipment;Ambulation;RW Toilet Transfer Details (indicate cue type and reason): simulated via transfer to/from recliner Toileting- Clothing Manipulation and Hygiene: Maximal assistance;+2 for physical assistance;Sitting/lateral lean;Sit to/from stand       Functional mobility during ADLs: Minimal assistance;+2 for physical assistance;+2 for safety/equipment (eva walker) General ADL Comments: pt with decreased endurance/activity tolerance, motivated to return to [employer]         Perception     Praxis      Pertinent Vitals/Pain Pain Assessment: Faces Faces  Pain Scale: Hurts a little bit Pain Location: chest Pain Descriptors / Indicators: Operative site guarding Pain Intervention(s): Monitored during session;Repositioned     Hand Dominance Right   Extremity/Trunk  Assessment Upper Extremity Assessment Upper Extremity Assessment: RUE deficits/detail;LUE deficits/detail;Generalized weakness RUE Deficits / Details: edema noted bil hands, limited assessment within sternal precautions LUE Deficits / Details: edema noted bil hands, limited assessment within sternal precautions   Lower Extremity Assessment Lower Extremity Assessment: Defer to PT evaluation       Communication Communication Communication: No difficulties   Cognition Arousal/Alertness: Awake/alert Behavior During Therapy: WFL for tasks assessed/performed Overall Cognitive Status: Within Functional Limits for tasks assessed                                 General Comments: very pleasant    General Comments       Exercises     Shoulder Instructions      Home Living Family/patient expects to be discharged to:: Private residence Living Arrangements: Spouse/significant other Available Help at Discharge: Family;Available 24 hours/day Type of Home: House Home Access: Stairs to enter [employer] of Steps: 3 Entrance Stairs-Rails: Right;Left Home Layout: One level     Bathroom Shower/Tub: Producer, television/film/video: Handicapped height     Home Equipment: Cane - single point          Prior Functioning/Environment Level of Independence: Independent        Comments: Independent with ADLs and IADLs; likes to golf, fish and garden; does not normally ambulate with cane        OT Problem List: Decreased strength;Decreased range of motion;Decreased activity tolerance;Impaired balance (sitting and/or standing);Decreased safety awareness;Decreased knowledge of use of DME or AE;Cardiopulmonary status limiting activity;Obesity      OT Treatment/Interventions: Self-care/ADL training;Therapeutic exercise;Energy conservation;DME and/or AE instruction;Therapeutic activities;Patient/family education;Balance training    OT Goals(Current goals can be  found in the care plan section) Acute Rehab OT Goals Patient Stated Goal: agreeable to rehab if needed OT Goal Formulation: With patient Time For Goal Achievement: 06/29/20 Potential to Achieve Goals: Good  OT Frequency: Min 2X/week   Barriers to D/C:            Co-evaluation              AM-PAC OT "6 Clicks" Daily Activity     Outcome Measure Help from another person eating meals?: None Help from another person taking care of personal grooming?: A Little Help from another person toileting, which includes using toliet, bedpan, or urinal?: A Lot Help from another person bathing (including washing, rinsing, drying)?: A Lot Help from another person to put on and taking off regular upper body clothing?: A Little Help from another person to put on and taking off regular lower body clothing?: A Lot 6 Click Score: 16   End of Session Equipment Utilized During Treatment: Other (comment) (eva walker ) Nurse Communication: Mobility status  Activity Tolerance: Patient tolerated treatment well Patient left: in chair;with call bell/phone within reach;with nursing/sitter in room (washing up with RN/NT)  OT Visit Diagnosis: Other abnormalities of gait and mobility (R26.89);Muscle weakness (generalized) (M62.81)                Time: 7654-6503 OT Time Calculation (min): 30 min Charges:  OT General Charges $OT Visit: 1 Visit OT Evaluation $OT Eval High Complexity: 1 High OT Treatments $Self Care/Home Management : 8-22 mins  Kaleen Odea  Orvan Falconer, OT Acute Rehabilitation Services Pager [phone] Office [phone]   Orlando Penner 05/21/2020, 11:31 AM

## 2020-05-21 NOTE — Progress Notes (Signed)
[place] changed out 

## 2020-05-22 ENCOUNTER — Telehealth: Payer: Self-pay | Admitting: Cardiovascular Disease

## 2020-05-22 ENCOUNTER — Inpatient Hospital Stay (HOSPITAL_COMMUNITY): Payer: Medicare Other

## 2020-05-22 LAB — GLUCOSE, CAPILLARY
Glucose-Capillary: 107 mg/dL — ABNORMAL HIGH (ref 70–99)
Glucose-Capillary: 108 mg/dL — ABNORMAL HIGH (ref 70–99)
Glucose-Capillary: 115 mg/dL — ABNORMAL HIGH (ref 70–99)
Glucose-Capillary: 75 mg/dL (ref 70–99)
Glucose-Capillary: 77 mg/dL (ref 70–99)
Glucose-Capillary: 85 mg/dL (ref 70–99)
Glucose-Capillary: 98 mg/dL (ref 70–99)

## 2020-05-22 LAB — COMPREHENSIVE METABOLIC PANEL
ALT: 454 U/L — ABNORMAL HIGH (ref 0–44)
AST: 60 U/L — ABNORMAL HIGH (ref 15–41)
Albumin: 2.6 g/dL — ABNORMAL LOW (ref 3.5–5.0)
Alkaline Phosphatase: 73 U/L (ref 38–126)
Anion gap: 9 (ref 5–15)
BUN: 18 mg/dL (ref 8–23)
CO2: 28 mmol/L (ref 22–32)
Calcium: 7.7 mg/dL — ABNORMAL LOW (ref 8.9–10.3)
Chloride: 97 mmol/L — ABNORMAL LOW (ref 98–111)
Creatinine, Ser: 0.65 mg/dL (ref 0.61–1.24)
GFR calc Af Amer: >60 mL/min (ref >60)
GFR calc non Af Amer: >60 mL/min (ref >60)
Glucose, Bld: 166 mg/dL — ABNORMAL HIGH (ref 70–99)
Potassium: 3.4 mmol/L — ABNORMAL LOW (ref 3.5–5.1)
Sodium: 134 mmol/L — ABNORMAL LOW (ref 135–145)
Total Bilirubin: 1.7 mg/dL — ABNORMAL HIGH (ref 0.3–1.2)
Total Protein: 5 g/dL — ABNORMAL LOW (ref 6.5–8.1)

## 2020-05-22 MED ORDER — POTASSIUM CHLORIDE CRYS ER 20 MEQ PO TBCR
20.0000 meq | EXTENDED_RELEASE_TABLET | ORAL | Status: AC
  Administered 2020-05-22 (×3): 20 meq via ORAL
  Filled 2020-05-22 (×3): qty 1

## 2020-05-22 MED ORDER — AMIODARONE HCL 200 MG PO TABS
200.0000 mg | ORAL_TABLET | Freq: Two times a day (BID) | ORAL | Status: DC
  Administered 2020-05-22 – 2020-05-29 (×15): 200 mg via ORAL
  Filled 2020-05-22 (×15): qty 1

## 2020-05-22 MED ORDER — ATORVASTATIN CALCIUM 40 MG PO TABS
40.0000 mg | ORAL_TABLET | Freq: Every day | ORAL | Status: DC
  Administered 2020-05-22 – 2020-05-29 (×8): 40 mg via ORAL
  Filled 2020-05-22 (×8): qty 1

## 2020-05-22 NOTE — Discharge Instructions (Signed)
[facility] OFFICE  (SURGEON) [id number]   Endoscopic Saphenous Vein Harvesting, Care After This sheet gives you information about how to care for yourself after your procedure. Your health care provider may also give you more specific instructions. If you have problems or questions, contact your health care provider. What can I expect after the procedure? After the procedure, it is common to have:  Pain.  Bruising.  Swelling.  Numbness. Follow these instructions at home: Incision care   Follow instructions from your health care provider about how to take care of your incisions. Make sure you: ? Wash your hands with soap and water before and after you change your bandages (dressings). If soap and water are not available, use hand sanitizer. ? Change your dressings as told by your health care provider. ? Leave stitches (sutures), skin glue, or adhesive strips in place. These skin closures may need to stay in place for 2 weeks or longer. If adhesive strip edges start to loosen and curl up, you may trim the loose edges. Do not remove adhesive strips completely unless your health care provider tells you to do that.  Check your incision areas every day for signs of infection. Check for: ? More redness, swelling, or pain. ? Fluid or blood. ? Warmth. ? Pus or a bad smell. Medicines  Take over-the-counter and prescription medicines only as told by your health care provider.  Ask your health care provider if the medicine prescribed to you requires you to avoid driving or using heavy machinery. General instructions  Raise (elevate) your legs above the level of your heart while you are sitting or lying down.  Avoid crossing your legs.  Avoid sitting for long periods of time. Change positions every 30 minutes.  Do any exercises your health care providers have given you. These may include deep breathing, coughing, and walking exercises.  Do not take baths, swim, or use a hot tub until your  health care provider approves. Ask your health care provider if you may take showers. You may only be allowed to take sponge baths.  Wear compression stockings as told by your health care provider. These stockings help to prevent blood clots and reduce swelling in your legs.  Keep all follow-up visits as told by your health care provider. This is important. Contact a health care provider if:  Medicine does not help your pain.  Your pain gets worse.  You have new leg bruises or your leg bruises get bigger.  Your leg feels numb.  You have more redness, swelling, or pain around your incision.  You have fluid or blood coming from your incision.  Your incision feels warm to the touch.  You have pus or a bad smell coming from your incision.  You have a fever. Get help right away if:  Your pain is severe.  You develop pain, tenderness, warmth, redness, or swelling in any part of your leg.  You have chest pain.  You have trouble breathing. Summary  Raise (elevate) your legs above the level of your heart while you are sitting or lying down.  Wear compression stockings as told by your health care provider.  Make sure you know which symptoms should prompt you to contact your health care provider.  Keep all follow-up visits as told by your health care provider. This information is not intended to replace advice given to you by your health care provider. Make sure you discuss any questions you have with your health care provider. Document Revised:  10/09/2018 Document Reviewed: 10/09/2018 Elsevier Patient Education  [employer]. Coronary Artery Bypass Grafting, Care After This sheet gives you information about how to care for yourself after your procedure. Your doctor may also give you more specific instructions. If you have problems or questions, call your doctor. What can I expect after the procedure? After the procedure, it is common to:  Feel sick to your stomach  (nauseous).  Not want to eat as much as normal (lack of appetite).  Have trouble pooping (constipation).  Have weakness and tiredness (fatigue).  Feel sad (depressed) or grouchy (irritable).  Have pain or discomfort around the cuts from surgery (incisions). Follow these instructions at home: Medicines  Take over-the-counter and prescription medicines only as told by your doctor. Do not stop taking medicines or start any new medicines unless your doctor says it is okay.  If you were prescribed an antibiotic medicine, take it as told by your doctor. Do not stop taking the antibiotic even if you start to feel better. Incision care   Follow instructions from your doctor about how to take care of your cuts from surgery. Make sure you: ? Wash your hands with soap and water before and after you change your bandage (dressing). If you cannot use soap and water, use hand sanitizer. ? Change your bandage as told by your doctor. ? Leave stitches (sutures), skin glue, or skin tape (adhesive) strips in place. They may need to stay in place for 2 weeks or longer. If tape strips get loose and curl up, you may trim the loose edges. Do not remove tape strips completely unless your doctor says it is okay.  Make sure the surgery cuts are clean, dry, and protected.  Check your cut areas every day for signs of infection. Check for: ? More redness, swelling, or pain. ? More fluid or blood. ? Warmth. ? Pus or a bad smell.  If cuts were made in your legs: ? Avoid crossing your legs. ? Avoid sitting for long periods of time. Change positions every 30 minutes. ? Raise (elevate) your legs when you are sitting. Bathing  Do not take baths, swim, or use a hot tub until your doctor says it is okay.  You may shower. Pat the surgery cuts dry. Do not rub the cuts to dry. Eating and drinking   Eat foods that are high in fiber, such as beans, nuts, whole grains, and raw fruits and vegetables. Any meats you  eat should be lean cut. Avoid canned, processed, and fried foods. This can help prevent trouble pooping. This is also a part of a heart-healthy diet.  Drink enough fluid to keep your pee (urine) pale yellow.  Do not drink alcohol until you are fully recovered. Ask your doctor when it is safe to drink alcohol. Activity  Rest and limit your activity as told by your doctor. You may be told to: ? Stop any activity right away if you have chest pain, shortness of breath, irregular heartbeats, or dizziness. Get help right away if you have any of these symptoms. ? Move around often for short periods or take short walks as told by your doctor. Slowly increase your activities. ? Avoid lifting, pushing, or pulling anything that is heavier than 10 lb (4.5 kg) for at least 6 weeks or as told by your doctor.  Do physical therapy or a cardiac rehab (cardiac rehabilitation) program as told by your doctor. ? Physical therapy involves doing exercises to maintain movement and build  strength and endurance. ? A cardiac rehab program includes:  Exercise training.  Education.  Counseling.  Do not drive until your doctor says it is okay.  Ask your doctor when you can go back to work.  Ask your doctor when you can be sexually active. General instructions  Do not drive or use heavy machinery while taking prescription pain medicine.  Do not use any products that contain nicotine or tobacco. These include cigarettes, e-cigarettes, and chewing tobacco. If you need help quitting, ask your doctor.  Take 2-3 deep breaths every few hours during the day while you get better. This helps expand your lungs and prevent problems.  If you were given a device called an incentive spirometer, use it several times a day to practice deep breathing. Support your chest with a pillow or your arms when you take deep breaths or cough.  Wear compression stockings as told by your doctor.  Weigh yourself every day. This helps to  see if your body is holding (retaining) fluid that may make your heart and lungs work harder.  Keep all follow-up visits as told by your doctor. This is important. Contact a doctor if:  You have more redness, swelling, or pain around any cut.  You have more fluid or blood coming from any cut.  Any cut feels warm to the touch.  You have pus or a bad smell coming from any cut.  You have a fever.  You have swelling in your ankles or legs.  You have pain in your legs.  You gain 2 lb (0.9 kg) or more a day.  You feel sick to your stomach or you throw up (vomit).  You have watery poop (diarrhea). Get help right away if:  You have chest pain that goes to your jaw or arms.  You are short of breath.  You have a fast or irregular heartbeat.  You notice a "clicking" in your breastbone (sternum) when you move.  You have any signs of a stroke. "BE FAST" is an easy way to remember the main warning signs: ? B - Balance. Signs are dizziness, sudden trouble walking, or loss of balance. ? E - Eyes. Signs are trouble seeing or a change in how you see. ? F - Face. Signs are sudden weakness or loss of feeling of the face, or the face or eyelid drooping on one side. ? A - Arms. Signs are weakness or loss of feeling in an arm. This happens suddenly and usually on one side of the body. ? S - Speech. Signs are sudden trouble speaking, slurred speech, or trouble understanding what people say. ? T - Time. Time to call emergency services. Write down what time symptoms started.  You have other signs of a stroke, such as: ? A sudden, very bad headache with no known cause. ? Feeling sick to your stomach. ? Throwing up. ? Jerky movements you cannot control (seizure). These symptoms may be an emergency. Do not wait to see if the symptoms will go away. Get medical help right away. Call your local emergency services (911 in the [place] Do not drive yourself to the hospital. Summary  After the  procedure, it is common to have pain or discomfort in the cuts from surgery (incisions).  Do not take baths, swim, or use a hot tub until your doctor says it is okay.  Slowly increase your activities. You may need physical therapy or cardiac rehab.  Weigh yourself every day. This helps to see  if your body is holding fluid. This information is not intended to replace advice given to you by your health care provider. Make sure you discuss any questions you have with your health care provider. Document Revised: 07/11/2018 Document Reviewed: 07/11/2018 Elsevier Patient Education  2020 [employer].  Information on my medicine - ELIQUIS (apixaban)  This medication education was reviewed with me or my healthcare representative as part of my discharge preparation.  The pharmacist that spoke with me during my hospital stay was:  Mosetta Anis, [facility]  Why was Eliquis prescribed for you? Eliquis was prescribed for you to reduce the risk of a blood clot forming that can cause a stroke if you have a medical condition called atrial fibrillation (a type of irregular heartbeat).  What do You need to know about Eliquis ? Take your Eliquis TWICE DAILY - one tablet in the morning and one tablet in the evening with or without food. If you have difficulty swallowing the tablet whole please discuss with your pharmacist how to take the medication safely.  Take Eliquis exactly as prescribed by your doctor and DO NOT stop taking Eliquis without talking to the doctor who prescribed the medication.  Stopping may increase your risk of developing a stroke.  Refill your prescription before you run out.  After discharge, you should have regular check-up appointments with your healthcare provider that is prescribing your Eliquis.  In the future your dose may need to be changed if your kidney function or weight changes by a significant amount or as you get older.  What do you do if you miss a dose? If you miss  a dose, take it as soon as you remember on the same day and resume taking twice daily.  Do not take more than one dose of ELIQUIS at the same time to make up a missed dose.  Important Safety Information A possible side effect of Eliquis is bleeding. You should call your healthcare provider right away if you experience any of the following: ? Bleeding from an injury or your nose that does not stop. ? Unusual colored urine (red or dark brown) or unusual colored stools (red or black). ? Unusual bruising for unknown reasons. ? A serious fall or if you hit your head (even if there is no bleeding).  Some medicines may interact with Eliquis and might increase your risk of bleeding or clotting while on Eliquis. To help avoid this, consult your healthcare provider or pharmacist prior to using any new prescription or non-prescription medications, including herbals, vitamins, non-steroidal anti-inflammatory drugs (NSAIDs) and supplements.  This website has more information on Eliquis (apixaban): http://www.eliquis.com/eliquis/home

## 2020-05-22 NOTE — Telephone Encounter (Signed)
Spoke with patients wife and she was very upset stating that they are planning to discharge him this weekend. She does not think she can provide care for him at home. She reports that he still has chest tubes, can't walk far, and overall just weak. Advised to discuss her concerns with providers caring for him in the hospital. If unable to see them then request call with updates and then share her concerns. She got a call from someone and had to take that call. She did request a call back later.

## 2020-05-22 NOTE — TOC Benefit Eligibility Note (Signed)
Transition of Care [facility]) Benefit Eligibility Note    Patient Details  Name: Sean Hubbard MRN: [id number] Date of Birth: [birth year]   Medication/Dose: BRILINTA  90 MG BID  Covered?: Yes  Tier: 3 Drug  Prescription Coverage Preferred Pharmacy: [facility]  Spoke with Person/Company/Phone Number:: HERA  @ [employer] RX # [phone] OPT- 2  Co-Pay: $47.00  Prior Approval: No  Deductible: Unmet       Mardene Sayer Phone Number: 05/22/2020, 9:27 AM

## 2020-05-22 NOTE — Telephone Encounter (Signed)
Wife calling in after patient went through the cath and then bypass surgery. Wife is expressing a sincere thank you to Lewayne Bunting and Dr. Mariah Milling. She states "with out Caitlin's recommendation I am not sure Abu would still be here" Wife would like to speak with Nurse Elita Quick when she is able

## 2020-05-22 NOTE — Progress Notes (Signed)
CARDIAC REHAB PHASE I   PRE:  Rate/Rhythm: 79 SR with PACs    BP: sitting 137/80    SaO2: 97 RA  MODE:  Ambulation: 140 ft   POST:  Rate/Rhythm: 90 SR with PACs    BP: sitting 122/76     SaO2: 96 RA  Pt sts he is feeling much better today after sleeping well. Mod assist to get to EOB. Pt held breath and red faced to stand. Still c/o dizziness today but improved somewhat. He was able to walk with EVA, gait belt, min assist x2. Increased distance but did have to sit to rest after 80 ft. C/o fatigue, DOE, and dizziness. Return to bed to have EPW pulled. Practiced IS and flutter, 1000 mL on IS. Encouraged recliner after bedrest and more ambulation.  [id number]   Harriet Masson CES, ACSM 05/22/2020 10:23 AM

## 2020-05-22 NOTE — Telephone Encounter (Signed)
Spoke with patients wife and reviewed that it looks like he is going to inpatient rehab. Reviewed Caitlin's input regarding rehab with PT/OT evaluations. Encouraged her to reach out to the nurse caring for her husband and share her concerns with request for providers to call her as well to discuss his plan of care. She was very appreciative for the call back with no further questions at this time.

## 2020-05-22 NOTE — Progress Notes (Signed)
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 (N/A) TRANSESOPHAGEAL ECHOCARDIOGRAM (TEE) (N/A) INDOCYANINE GREEN FLUORESCENCE IMAGING (ICG) (N/A) ENDOVEIN HARVEST OF GREATER SAPHENOUS VEIN (Right) Subjective: conts to feel better  Objective: Vital signs in last 24 hours: Temp:  [96.9 F (36.1 C)-98.5 F (36.9 C)] 98.2 F (36.8 C) (07/08 0405) Pulse Rate:  [42-99] 70 (07/07 2319) Cardiac Rhythm: Heart block;Bundle branch block (07/07 1900) Resp:  [14-25] 19 (07/08 0405) BP: (112-144)/(69-94) 128/81 (07/08 0405) SpO2:  [93 %-100 %] 99 % (07/08 0405) Weight:  [126.9 kg] 126.9 kg (07/08 0606)  Hemodynamic parameters for last 24 hours:    Intake/Output from previous day: 07/07 0701 - 07/08 0700 In: 989.1 [P.O.:440; I.V.:549.1] Out: 3345 [Urine:2525; Chest Tube:820] Intake/Output this shift: Total I/O In: 412.6 [I.V.:412.6] Out: 1120 [Urine:800; Chest Tube:320]  General appearance: alert, cooperative and no distress Heart: irregular at times Lungs: clear to auscultation bilaterally Abdomen: benign Extremities: + upper and LE edema Wound: incis healing well  Lab Results: Recent Labs    05/21/20 0445 05/21/20 1610  WBC 10.6* 10.5  HGB 10.1* 10.3*  HCT 31.8* 31.7*  PLT 104* 125*   BMET:  Recent Labs    05/21/20 1600 05/22/20 0419  NA 134* 134*  K 3.5 3.4*  CL 98 97*  CO2 28 28  GLUCOSE 218* 166*  BUN 21 18  CREATININE 0.77 0.65  CALCIUM 7.5* 7.7*    PT/INR: No results for input(s): LABPROT, INR in the last 72 hours. ABG    Component Value Date/Time   PHART 7.459 (H) 05/19/2020 1218   HCO3 27.6 05/19/2020 1218   TCO2 29 05/19/2020 1218   ACIDBASEDEF 5.0 (H) 05/17/2020 0812   O2SAT 58.7 05/21/2020 0409   CBG (last 3)  Recent Labs    05/21/20 1945  05/22/20 0011 05/22/20 0333  GLUCAP 128* 85 77    Meds Scheduled Meds:  allopurinol  100 mg Oral Daily   apixaban  5 mg Oral BID   aspirin EC  81 mg Oral Daily   bisacodyl  10 mg Oral Daily   Or   bisacodyl  10 mg Rectal Daily   Chlorhexidine Gluconate Cloth  6 each Topical Daily   clopidogrel  75 mg Oral Daily   colchicine  0.6 mg Oral BID   docusate sodium  200 mg Oral Daily   furosemide  60 mg Intravenous BID   guaiFENesin  600 mg Oral BID   insulin aspart  0-15 Units Subcutaneous Q4H   insulin detemir  10 Units Subcutaneous BID   levalbuterol  1.25 mg Nebulization TID   mouth rinse  15 mL Mouth Rinse BID   metoprolol tartrate  25 mg Oral Q8H   Or   metoprolol tartrate  25 mg Per Tube Q8H   pantoprazole  40 mg Oral Daily   sodium chloride flush  10-40 mL Intracatheter Q12H   thiamine  100 mg Intravenous Daily   Continuous Infusions:  amiodarone 30 mg/hr (05/22/20 0600)   diltiazem (CARDIZEM) infusion 5 mg/hr (05/22/20 0600)   PRN Meds:.fentaNYL (SUBLIMAZE) injection, metoprolol tartrate, ondansetron (ZOFRAN) IV, oxyCODONE, traMADol  Xrays No results found.  Assessment/Plan: S/P Procedure(s) (LRB): CORONARY ARTERY BYPASS GRAFTING (CABG) X  3 (N/A) AORTIC VALVE REPLACEMENT (AVR) USING 25 MM EDWARDS INTUITY ELITE VALVE (N/A) TRANSESOPHAGEAL ECHOCARDIOGRAM (TEE) (N/A) INDOCYANINE GREEN FLUORESCENCE IMAGING (ICG) (N/A) ENDOVEIN HARVEST OF GREATER SAPHENOUS VEIN (Right)  1 doing well with slow but steady progress 2 VSS, sinus with PAC's some bradycardia- will d/c cardizem and change amio to po. sats are good on RA 3 CT drainage is decreasing, hopefully can d/c soon, still moderate 4 remains volume overloaded, cont BID IV lasix for now 5 replace K+ 6 LFT approaching normal, total Bili is a bit elavated now at 1.7- monitor- abdomen is benign 7 BS adeq control 8 cont to push rehab and routine pulm toilet 9 CXR is pending 10 on eliquis, plavix ,  asa- may be able to narrow RX, will get pacer wires out  LOS: 7 days    Rowe Clack PA-C Pager [phone] 05/22/2020

## 2020-05-22 NOTE — Progress Notes (Signed)
Pacing wires pulled per order with no complications. Redressed chest tube sites, CDI. Bedrest until 1230, VSS, no EKG changes. Bedside RN continue to monitor.

## 2020-05-22 NOTE — Progress Notes (Signed)
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  Pt has been admitted with CAD involving the native coronary arteries with unstable angina. 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  Pt weaned from vent 05/18/20.    PT Comments    Patient progressing with ambulation and able to use standard RW though felt dizzy and initially requested to ride back in chair to his room.  But after rest able to stand and walk back.  Noted dyspnea 3/4, but with less breath holding.  Patient remains appropriate for CIR level rehab at d/c.    Follow Up Recommendations  CIR     Equipment Recommendations  Rolling walker with 5" wheels;3in1 (PT);Other (comment)    Recommendations for Other Services       Precautions / Restrictions Precautions Precautions: Sternal;Fall Precaution Comments: 2 chest tubes    Mobility  Bed Mobility Overal bed mobility: Needs Assistance Bed Mobility: Rolling;Sidelying to Sit Rolling: Mod assist Sidelying to sit: Mod assist;HOB elevated       General bed mobility comments: cues for technique, to hold chest pillow, not to push with UE's  Transfers Overall transfer level: Needs assistance Equipment used: Rolling walker (2 wheeled) Transfers: Sit to/from [employer] Sit to Stand: Min assist;+2 physical assistance Stand pivot transfers: Min assist       General transfer comment: up from elevated bed with cues/momentum mod A and stand step to [facility] with RW and min A; up from 3:1 +2 min A  Ambulation/Gait Ambulation/Gait assistance: +2 safety/equipment;Min assist Gait Distance (Feet): 80 Feet (x 2) Assistive device: Rolling walker (2 wheeled) Gait Pattern/deviations:  Step-through pattern;Decreased stride length     General Gait Details: assist for safety, cues to slow down, sat in chair to rest prior to return to room   Stairs             Wheelchair Mobility    Modified Rankin (Stroke Patients Only)       Balance Overall balance assessment: Needs assistance Sitting-balance support: Feet supported Sitting balance-Leahy Scale: Fair     Standing balance support: Bilateral upper extremity supported Standing balance-Leahy Scale: Poor Standing balance comment: UE support for balance while assist for toilet hygiene                            Cognition Arousal/Alertness: Awake/alert Behavior During Therapy: WFL for tasks assessed/performed Overall Cognitive Status: Within Functional Limits for tasks assessed                                 General Comments: but cues to remember sternal precautions      Exercises      General Comments General comments (skin integrity, edema, etc.): SpO2 not reading well initially (80's with poor wave form and 60's with ambulation) replaced oxisensor with SpO2 97-100% on RA, HR 98 with ambulation, BP 135/84      Pertinent Vitals/Pain Pain Assessment: Faces Faces Pain Scale: Hurts little more Pain Location: chest Pain Descriptors / Indicators: Operative site guarding Pain Intervention(s): Monitored during session;Repositioned    Home Living  Prior Function            PT Goals (current goals can now be found in the care plan section) Progress towards PT goals: Progressing toward goals    Frequency    Min 3X/week      PT Plan Current plan remains appropriate    Co-evaluation              AM-PAC PT "6 Clicks" Mobility   Outcome Measure  Help needed turning from your back to your side while in a flat bed without using bedrails?: A Lot Help needed moving from lying on your back to sitting on the side of a flat bed without  using bedrails?: A Little Help needed moving to and from a bed to a chair (including a wheelchair)?: A Lot Help needed standing up from a chair using your arms (e.g., wheelchair or bedside chair)?: A Lot Help needed to walk in hospital room?: A Lot   6 Click Score: 11    End of Session   Activity Tolerance: Patient tolerated treatment well Patient left: in chair;with call bell/phone within reach   PT Visit Diagnosis: Muscle weakness (generalized) (M62.81);Other abnormalities of gait and mobility (R26.89)     Time: 8413-2440 PT Time Calculation (min) (ACUTE ONLY): 32 min  Charges:  $Gait Training: 8-22 mins $Therapeutic Activity: 8-22 mins                     Sheran Lawless, PT Acute Rehabilitation Services [phone] Office:[phone] 05/22/2020    Elray Mcgregor 05/22/2020, 3:06 PM

## 2020-05-23 LAB — GLUCOSE, CAPILLARY
Glucose-Capillary: 82 mg/dL (ref 70–99)
Glucose-Capillary: 82 mg/dL (ref 70–99)
Glucose-Capillary: 84 mg/dL (ref 70–99)
Glucose-Capillary: 85 mg/dL (ref 70–99)
Glucose-Capillary: 85 mg/dL (ref 70–99)
Glucose-Capillary: 85 mg/dL (ref 70–99)
Glucose-Capillary: 89 mg/dL (ref 70–99)

## 2020-05-23 LAB — BASIC METABOLIC PANEL
Anion gap: 9 (ref 5–15)
BUN: 18 mg/dL (ref 8–23)
CO2: 28 mmol/L (ref 22–32)
Calcium: 7.4 mg/dL — ABNORMAL LOW (ref 8.9–10.3)
Chloride: 98 mmol/L (ref 98–111)
Creatinine, Ser: 0.92 mg/dL (ref 0.61–1.24)
GFR calc Af Amer: >60 mL/min (ref >60)
GFR calc non Af Amer: >60 mL/min (ref >60)
Glucose, Bld: 136 mg/dL — ABNORMAL HIGH (ref 70–99)
Potassium: 3 mmol/L — ABNORMAL LOW (ref 3.5–5.1)
Sodium: 135 mmol/L (ref 135–145)

## 2020-05-23 MED ORDER — LEVALBUTEROL HCL 1.25 MG/0.5ML IN NEBU
1.2500 mg | INHALATION_SOLUTION | Freq: Two times a day (BID) | RESPIRATORY_TRACT | Status: DC
  Administered 2020-05-23 – 2020-05-24 (×2): 1.25 mg via RESPIRATORY_TRACT
  Filled 2020-05-23 (×2): qty 0.5

## 2020-05-23 MED ORDER — POTASSIUM CHLORIDE CRYS ER 20 MEQ PO TBCR
20.0000 meq | EXTENDED_RELEASE_TABLET | ORAL | Status: AC
  Administered 2020-05-23 – 2020-05-24 (×3): 20 meq via ORAL
  Filled 2020-05-23 (×3): qty 1

## 2020-05-23 MED ORDER — LOPERAMIDE HCL 2 MG PO CAPS
2.0000 mg | ORAL_CAPSULE | Freq: Once | ORAL | Status: AC
  Administered 2020-05-23: 2 mg via ORAL
  Filled 2020-05-23: qty 1

## 2020-05-23 MED ORDER — LEVALBUTEROL HCL 0.63 MG/3ML IN NEBU: 0.6300 mg | INHALATION_SOLUTION | Freq: Four times a day (QID) | RESPIRATORY_TRACT | Status: DC | PRN

## 2020-05-23 NOTE — Progress Notes (Signed)
Inpatient Rehab Admissions Coordinator:   Met with pt and wife at bedside to touch base.  I will be out of the office next week, but one of my colleagues will continue to follow for timing of potential CIR admission, pending bed availability and medical readiness.    Shann Medal, PT, DPT Admissions Coordinator [phone] 05/23/20  12:54 PM

## 2020-05-23 NOTE — Progress Notes (Signed)
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  Pt has been admitted with CAD involving the native coronary arteries with unstable angina. 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  Pt weaned from vent 05/18/20.    PT Comments    Patient progressing with ambulation distance despite unable to participate this am with cardiac rehab for ambulation due to dizziness.  He reports prior history of BPPV with treatment x 2 in past 6 months, but reports had some episodes prior he feels were related to his cardiac issues.  Noted some L ear decreased hearing  compared to R.  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  PT to continue to follow actuely.  Continue to recommend CIR at d/c.   Follow Up Recommendations  CIR     Equipment Recommendations  Rolling walker with 5" wheels;3in1 (PT);Other (comment)    Recommendations for Other Services       Precautions / Restrictions Precautions Precautions: Sternal;Fall Precaution Comments: 2 chest tubes    Mobility  Bed Mobility Overal bed mobility: Needs Assistance Bed Mobility: Rolling;Sidelying to Sit Rolling: Min assist Sidelying to sit: Min assist Supine to sit: Mod assist     General bed mobility comments: cues for precautions, assist for trunk up to sit, for both legs to supine with both being very effortful with pt turning very red in the face; cues for visual substitution due to vestibular issues  Transfers Overall transfer level: Needs assistance Equipment used: Rolling walker (2 wheeled);4-wheeled walker (EVA) Transfers: Sit to/from Stand Sit to Stand: Min assist Stand pivot transfers: Min assist       General transfer comment: up to  EVA for ambulation, for stand step to [facility] used RW  Ambulation/Gait Ambulation/Gait assistance: Min assist;+2 safety/equipment Gait Distance (Feet): 140 Feet Assistive device: 4-wheeled walker Gait Pattern/deviations: Step-through pattern;Decreased stride length;Wide base of support     General Gait Details: using EVA for ambulation due to heavy chest tubes x 2 and pt feels more steady given vertigo, used cues for visual substitution due to imbalance/vertigo   Stairs             Wheelchair Mobility    Modified Rankin (Stroke Patients Only)       Balance Overall balance assessment: Needs assistance Sitting-balance support: Feet supported Sitting balance-Leahy Scale: Fair     Standing balance support: Bilateral upper extremity supported Standing balance-Leahy Scale: Poor Standing balance comment: UE support for balance while assist for toilet hygiene                            Cognition Arousal/Alertness: Awake/alert Behavior During Therapy: WFL for tasks assessed/performed Overall Cognitive Status: Within Functional Limits for tasks assessed                                        Exercises      General Comments General comments (skin integrity, edema, etc.): daughter and wife present; VSS with ambulation;      Pertinent Vitals/Pain Pain Assessment: Faces Faces Pain Scale: Hurts a little bit Pain Location:  chest Pain Descriptors / Indicators: Operative site guarding Pain Intervention(s): Monitored during session    Home Living                      Prior Function            PT Goals (current goals can now be found in the care plan section) Progress towards PT goals: Progressing toward goals    Frequency    Min 3X/week      PT Plan Current plan remains appropriate    Co-evaluation              AM-PAC PT "6 Clicks" Mobility   Outcome Measure  Help needed turning from your back to your side while in  a flat bed without using bedrails?: A Lot Help needed moving from lying on your back to sitting on the side of a flat bed without using bedrails?: A Little Help needed moving to and from a bed to a chair (including a wheelchair)?: A Little Help needed standing up from a chair using your arms (e.g., wheelchair or bedside chair)?: A Little Help needed to walk in hospital room?: A Little Help needed climbing 3-5 steps with a railing? : A Lot 6 Click Score: 16    End of Session   Activity Tolerance: Patient tolerated treatment well Patient left: in bed;with call bell/phone within reach;with family/visitor present   PT Visit Diagnosis: Muscle weakness (generalized) (M62.81);Other abnormalities of gait and mobility (R26.89)     Time: 3545-6256 PT Time Calculation (min) (ACUTE ONLY): 35 min  Charges:  $Gait Training: 8-22 mins $Therapeutic Activity: 8-22 mins                     Sheran Lawless, PT Acute Rehabilitation Services [phone] Office:[phone] 05/23/2020    Elray Mcgregor 05/23/2020, 5:30 PM

## 2020-05-23 NOTE — Progress Notes (Signed)
[address]       [place] [address]             [phone]      7 Days Post-Op Procedure(s) (LRB): CORONARY ARTERY BYPASS GRAFTING (CABG) X 3 (N/A) AORTIC VALVE REPLACEMENT (AVR) USING 25 MM EDWARDS INTUITY ELITE VALVE (N/A) TRANSESOPHAGEAL ECHOCARDIOGRAM (TEE) (N/A) INDOCYANINE GREEN FLUORESCENCE IMAGING (ICG) (N/A) ENDOVEIN HARVEST OF GREATER SAPHENOUS VEIN (Right) Subjective: conts to slowly feel better CT's cont with moderate drainage  Objective: Vital signs in last 24 hours: Temp:  [97.7 F (36.5 C)-98.8 F (37.1 C)] 97.9 F (36.6 C) (07/09 0316) Pulse Rate:  [72-88] 75 (07/09 0316) Cardiac Rhythm: Normal sinus rhythm;Bundle branch block (07/08 2000) Resp:  [13-17] 17 (07/09 0316) BP: (109-135)/(62-84) 109/75 (07/09 0316) SpO2:  [95 %-100 %] 100 % (07/09 0723) Weight:  [123.9 kg] 123.9 kg (07/09 0316)  Hemodynamic parameters for last 24 hours:    Intake/Output from previous day: 07/08 0701 - 07/09 0700 In: 154.9 [I.V.:154.9] Out: 3885 [Urine:2975; Chest Tube:910] Intake/Output this shift: No intake/output data recorded.  General appearance: alert, cooperative and no distress Heart: regular rate and rhythm Lungs: clear to auscultation bilaterally Abdomen: mild distension, non-tender Extremities: + edema Wound: incis healing well  Lab Results: Recent Labs    05/21/20 0445 05/21/20 1610  WBC 10.6* 10.5  HGB 10.1* 10.3*  HCT 31.8* 31.7*  PLT 104* 125*   BMET:  Recent Labs    05/21/20 1600 05/22/20 0419  NA 134* 134*  K 3.5 3.4*  CL 98 97*  CO2 28 28  GLUCOSE 218* 166*  BUN 21 18  CREATININE 0.77 0.65  CALCIUM 7.5* 7.7*    PT/INR: No results for input(s): LABPROT, INR in the last 72 hours. ABG    Component Value Date/Time   PHART 7.459 (H) 05/19/2020 1218   HCO3 27.6 05/19/2020 1218   TCO2 29 05/19/2020 1218   ACIDBASEDEF 5.0 (H) 05/17/2020 0812   O2SAT 58.7 05/21/2020 0409   CBG (last 3)  Recent Labs     05/22/20 1939 05/22/20 2332 05/23/20 0318  GLUCAP 107* 82 89    Meds Scheduled Meds: . allopurinol  100 mg Oral Daily  . amiodarone  200 mg Oral BID  . apixaban  5 mg Oral BID  . atorvastatin  40 mg Oral Daily  . bisacodyl  10 mg Oral Daily   Or  . bisacodyl  10 mg Rectal Daily  . Chlorhexidine Gluconate Cloth  6 each Topical Daily  . clopidogrel  75 mg Oral Daily  . colchicine  0.6 mg Oral BID  . docusate sodium  200 mg Oral Daily  . furosemide  60 mg Intravenous BID  . guaiFENesin  600 mg Oral BID  . insulin aspart  0-15 Units Subcutaneous Q4H  . insulin detemir  10 Units Subcutaneous BID  . levalbuterol  1.25 mg Nebulization BID  . mouth rinse  15 mL Mouth Rinse BID  . metoprolol tartrate  25 mg Oral Q8H   Or  . metoprolol tartrate  25 mg Per Tube Q8H  . pantoprazole  40 mg Oral Daily  . sodium chloride flush  10-40 mL Intracatheter Q12H  . thiamine  100 mg Intravenous Daily   Continuous Infusions: PRN Meds:.fentaNYL (SUBLIMAZE) injection, levalbuterol, metoprolol tartrate, ondansetron (ZOFRAN) IV, oxyCODONE, traMADol  Xrays DG Chest 2 View  Result Date: 05/22/2020 CLINICAL DATA:  Aortic valve replacement EXAM: CHEST - 2 VIEW COMPARISON:  05/20/2020 FINDINGS:  Median sternotomy post CABG changes and aortic valve replacement. Bilateral chest tubes remain in place. Right-sided PICC line is unchanged. Stable enlargement of the cardiomediastinal silhouette. Improving aeration of the upper lung fields. Persistent bibasilar atelectasis with small bilateral pleural effusions. No pneumothorax. IMPRESSION: 1. Improving aeration of the upper lung fields. 2. Persistent bibasilar atelectasis with small bilateral pleural effusions. Electronically Signed   By: Duanne Guess D.O.   On: 05/22/2020 08:03    Assessment/Plan: S/P Procedure(s) (LRB): CORONARY ARTERY BYPASS GRAFTING (CABG) X 3 (N/A) AORTIC VALVE REPLACEMENT (AVR) USING 25 MM EDWARDS INTUITY ELITE VALVE  (N/A) TRANSESOPHAGEAL ECHOCARDIOGRAM (TEE) (N/A) INDOCYANINE GREEN FLUORESCENCE IMAGING (ICG) (N/A) ENDOVEIN HARVEST OF GREATER SAPHENOUS VEIN (Right)  1 slow but steady improvement 2 chest tubes with 390/520 cc yesterday - continue 3 good UOP , weight slowly improving- cont IV lasix BID for now 4 BMET pending 5 BS adeq controlled , not a diabetic on meds at home, Hg A1c 5.6 so probably at least some degree of insulin resistance 6 PT recs CIR- prob will be ready first of week 7 VSS, no fevers, sinus with 1 deg HB, PAC's, cont amio and beta blocker, eliquis, no recent afib 8 repeat labs in am  LOS: 8 days    Rowe Clack PA-C Pager [phone] 05/23/2020

## 2020-05-23 NOTE — Progress Notes (Signed)
Patient's sternal incision was leaking brown drainage  Site cleaned the and appliet a dry dressing, PA notified. Will continue to monitor patient.

## 2020-05-23 NOTE — Plan of Care (Signed)
  Problem: Education: Goal: Knowledge of General Education information will improve Description: Including pain rating scale, medication(s)/side effects and non-pharmacologic comfort measures Outcome: Progressing   Problem: Clinical Measurements: Goal: Respiratory complications will improve Outcome: Progressing   Problem: Activity: Goal: Risk for activity intolerance will decrease Outcome: Progressing   Problem: Elimination: Goal: Will not experience complications related to urinary retention Outcome: Progressing   Problem: Pain Managment: Goal: General experience of comfort will improve Outcome: Progressing

## 2020-05-23 NOTE — Progress Notes (Signed)
CARDIAC REHAB PHASE I   PRE:  Rate/Rhythm: 91 SR with PACs  BP:  Lying: 117/72      SaO2: 100 RA  Offered to walk with pt. Pt c/o dizziness and weakness from having multiple BMs this am. Pt c/o 6/10 dizziness. Encouraged IS and Flutter use. Will f/u later as able to continue to encourage ambulation.  [id number] Reynold Bowen, RN BSN 05/23/2020 11:03 AM

## 2020-05-24 LAB — CBC
HCT: 31.9 % — ABNORMAL LOW (ref 39.0–52.0)
Hemoglobin: 10.4 g/dL — ABNORMAL LOW (ref 13.0–17.0)
MCH: 29.3 pg (ref 26.0–34.0)
MCHC: 32.6 g/dL (ref 30.0–36.0)
MCV: 89.9 fL (ref 80.0–100.0)
Platelets: 246 10*3/uL (ref 150–400)
RBC: 3.55 MIL/uL — ABNORMAL LOW (ref 4.22–5.81)
RDW: 14.9 % (ref 11.5–15.5)
WBC: 11 10*3/uL — ABNORMAL HIGH (ref 4.0–10.5)
nRBC: 0 % (ref 0.0–0.2)

## 2020-05-24 LAB — GLUCOSE, CAPILLARY
Glucose-Capillary: 83 mg/dL (ref 70–99)
Glucose-Capillary: 109 mg/dL — ABNORMAL HIGH (ref 70–99)
Glucose-Capillary: 88 mg/dL (ref 70–99)
Glucose-Capillary: 111 mg/dL — ABNORMAL HIGH (ref 70–99)
Glucose-Capillary: 99 mg/dL (ref 70–99)

## 2020-05-24 LAB — BASIC METABOLIC PANEL
Anion gap: 7 (ref 5–15)
BUN: 15 mg/dL (ref 8–23)
CO2: 31 mmol/L (ref 22–32)
Calcium: 7.7 mg/dL — ABNORMAL LOW (ref 8.9–10.3)
Chloride: 99 mmol/L (ref 98–111)
Creatinine, Ser: 0.97 mg/dL (ref 0.61–1.24)
GFR calc Af Amer: >60 mL/min (ref >60)
GFR calc non Af Amer: >60 mL/min (ref >60)
Glucose, Bld: 100 mg/dL — ABNORMAL HIGH (ref 70–99)
Potassium: 3.2 mmol/L — ABNORMAL LOW (ref 3.5–5.1)
Sodium: 137 mmol/L (ref 135–145)

## 2020-05-24 LAB — HEPATIC FUNCTION PANEL
ALT: 205 U/L — ABNORMAL HIGH (ref 0–44)
AST: 40 U/L (ref 15–41)
Albumin: 2.5 g/dL — ABNORMAL LOW (ref 3.5–5.0)
Alkaline Phosphatase: 84 U/L (ref 38–126)
Bilirubin, Direct: 0.4 mg/dL — ABNORMAL HIGH (ref 0.0–0.2)
Indirect Bilirubin: 1.2 mg/dL — ABNORMAL HIGH (ref 0.3–0.9)
Total Bilirubin: 1.6 mg/dL — ABNORMAL HIGH (ref 0.3–1.2)
Total Protein: 4.9 g/dL — ABNORMAL LOW (ref 6.5–8.1)

## 2020-05-24 MED ORDER — POTASSIUM CHLORIDE CRYS ER 20 MEQ PO TBCR: 40.0000 meq | EXTENDED_RELEASE_TABLET | Freq: Two times a day (BID) | ORAL | Status: DC

## 2020-05-24 MED ORDER — POTASSIUM CHLORIDE CRYS ER 20 MEQ PO TBCR: 70.0000 meq | EXTENDED_RELEASE_TABLET | Freq: Two times a day (BID) | ORAL | Status: DC

## 2020-05-24 MED ORDER — POTASSIUM CHLORIDE CRYS ER 20 MEQ PO TBCR: 30.0000 meq | EXTENDED_RELEASE_TABLET | Freq: Two times a day (BID) | ORAL | Status: DC

## 2020-05-24 MED ORDER — POTASSIUM CHLORIDE CRYS ER 20 MEQ PO TBCR
30.0000 meq | EXTENDED_RELEASE_TABLET | Freq: Two times a day (BID) | ORAL | Status: DC
  Administered 2020-05-25 – 2020-05-26 (×4): 30 meq via ORAL
  Filled 2020-05-24 (×5): qty 1

## 2020-05-24 MED ORDER — THIAMINE HCL 100 MG PO TABS
100.0000 mg | ORAL_TABLET | Freq: Every day | ORAL | Status: DC
  Administered 2020-05-25 – 2020-05-29 (×5): 100 mg via ORAL
  Filled 2020-05-24 (×5): qty 1

## 2020-05-24 MED ORDER — POTASSIUM CHLORIDE CRYS ER 20 MEQ PO TBCR
50.0000 meq | EXTENDED_RELEASE_TABLET | Freq: Two times a day (BID) | ORAL | Status: AC
  Administered 2020-05-24 (×2): 50 meq via ORAL
  Filled 2020-05-24 (×2): qty 2

## 2020-05-24 NOTE — Progress Notes (Addendum)
      [address]       [place] [address]             [phone]        8 Days Post-Op Procedure(s) (LRB): CORONARY ARTERY BYPASS GRAFTING (CABG) X 3 (N/A) AORTIC VALVE REPLACEMENT (AVR) USING 25 MM EDWARDS INTUITY ELITE VALVE (N/A) TRANSESOPHAGEAL ECHOCARDIOGRAM (TEE) (N/A) INDOCYANINE GREEN FLUORESCENCE IMAGING (ICG) (N/A) ENDOVEIN HARVEST OF GREATER SAPHENOUS VEIN (Right)  Subjective: Patient states chair he is sitting in is uncomfortable. He has some loose stools.  Objective: Vital signs in last 24 hours: Temp:  [97.6 F (36.4 C)-98 F (36.7 C)] 97.6 F (36.4 C) (07/10 0751) Pulse Rate:  [78-96] 88 (07/10 0533) Cardiac Rhythm: Bundle branch block;Heart block (07/10 0727) Resp:  [13-19] 19 (07/10 0308) BP: (110-124)/(66-73) 115/67 (07/10 0533) SpO2:  [98 %-100 %] 100 % (07/10 0804) Weight:  [119.7 kg] 119.7 kg (07/10 0308)  Pre op weight 112.2 kg Current Weight  05/24/20 119.7 kg      Intake/Output from previous day: 07/09 0701 - 07/10 0700 In: 480 [P.O.:480] Out: 2535 [Urine:1775; Chest Tube:760]   Physical Exam:  Cardiovascular: RRR Pulmonary: Slightly diminished bibasilar breath sounds Abdomen: Soft, obese, non tender, bowel sounds present. Extremities: ++ bilateral lower extremity edema. Wounds: Clean and dry.  No erythema or signs of infection.  Lab Results: CBC: Recent Labs    05/21/20 1610 05/24/20 0325  WBC 10.5 11.0*  HGB 10.3* 10.4*  HCT 31.7* 31.9*  PLT 125* 246   BMET:  Recent Labs    05/23/20 1329 05/24/20 0325  NA 135 137  K 3.0* 3.2*  CL 98 99  CO2 28 31  GLUCOSE 136* 100*  BUN 18 15  CREATININE 0.92 0.97  CALCIUM 7.4* 7.7*    PT/INR:  Lab Results  Component Value Date   INR 1.8 (H) 05/17/2020   INR 2.0 (H) 05/17/2020   INR 2.1 (H) 05/16/2020   ABG:  INR: Will add last result for INR, ABG once components are confirmed Will add last 4 CBG results once components are  confirmed  Assessment/Plan:  1. CV - Previous a fib. Maintaining SR, first degree heart block. On Amiodarone 200 mg bid, Plavix 75 mg daily,  Lopressor 25 mg tid 2.  Pulmonary - On room air. Chest tubes with 760 cc last 24 hours. Chest tubes to remain. Check CXR in am. Encourage incentive spirometer 4.  Expected post op acute blood loss anemia - H and H stable at 10.4 and 31.9 5. CBGs 84/83/109. HGA1C 5.6. Will need to be followed by medical doctor after discharge as likely has some insulin resistance. Stop accu checks and SS 6. Mild thrombocytopenia resolved as platelets up to 246,000 7. Elevated transaminases improved: ALT 205, T bili 1.6 and I bili 1.2 8. Supplement potassium 9. Volume overload-on Lasix 60 mg IV bid 10. Patient with loose stools so will stop stool softeners  Donielle M ZimmermanPA-C 05/24/2020,9:23 AM  Agree with above. Continues to have high chest tube output despite aggressive diuresis. Continue physical therapy  Maia Handa O Jermond Burkemper

## 2020-05-25 ENCOUNTER — Inpatient Hospital Stay (HOSPITAL_COMMUNITY): Payer: Medicare Other

## 2020-05-25 LAB — BASIC METABOLIC PANEL
Anion gap: 8 (ref 5–15)
BUN: 14 mg/dL (ref 8–23)
CO2: 29 mmol/L (ref 22–32)
Calcium: 7.8 mg/dL — ABNORMAL LOW (ref 8.9–10.3)
Chloride: 100 mmol/L (ref 98–111)
Creatinine, Ser: 0.94 mg/dL (ref 0.61–1.24)
GFR calc Af Amer: >60 mL/min (ref >60)
GFR calc non Af Amer: >60 mL/min (ref >60)
Glucose, Bld: 100 mg/dL — ABNORMAL HIGH (ref 70–99)
Potassium: 3.6 mmol/L (ref 3.5–5.1)
Sodium: 137 mmol/L (ref 135–145)

## 2020-05-25 LAB — GLUCOSE, CAPILLARY
Glucose-Capillary: 100 mg/dL — ABNORMAL HIGH (ref 70–99)
Glucose-Capillary: 105 mg/dL — ABNORMAL HIGH (ref 70–99)
Glucose-Capillary: 129 mg/dL — ABNORMAL HIGH (ref 70–99)
Glucose-Capillary: 146 mg/dL — ABNORMAL HIGH (ref 70–99)
Glucose-Capillary: 85 mg/dL (ref 70–99)
Glucose-Capillary: 89 mg/dL (ref 70–99)
Glucose-Capillary: 98 mg/dL (ref 70–99)

## 2020-05-25 LAB — MAGNESIUM: Magnesium: 1.7 mg/dL (ref 1.7–2.4)

## 2020-05-25 MED ORDER — POTASSIUM CHLORIDE CRYS ER 20 MEQ PO TBCR
30.0000 meq | EXTENDED_RELEASE_TABLET | Freq: Once | ORAL | Status: AC
  Administered 2020-05-25: 30 meq via ORAL
  Filled 2020-05-25: qty 1

## 2020-05-25 MED ORDER — FUROSEMIDE 10 MG/ML IJ SOLN
40.0000 mg | Freq: Two times a day (BID) | INTRAMUSCULAR | Status: DC
  Administered 2020-05-25 – 2020-05-26 (×3): 40 mg via INTRAVENOUS
  Filled 2020-05-25 (×3): qty 4

## 2020-05-25 MED ORDER — POTASSIUM CHLORIDE CRYS ER 20 MEQ PO TBCR: 40.0000 meq | EXTENDED_RELEASE_TABLET | Freq: Once | ORAL | Status: DC

## 2020-05-25 NOTE — Progress Notes (Addendum)
      [address]       [place] [address]             [phone]        9 Days Post-Op Procedure(s) (LRB): CORONARY ARTERY BYPASS GRAFTING (CABG) X 3 (N/A) AORTIC VALVE REPLACEMENT (AVR) USING 25 MM EDWARDS INTUITY ELITE VALVE (N/A) TRANSESOPHAGEAL ECHOCARDIOGRAM (TEE) (N/A) INDOCYANINE GREEN FLUORESCENCE IMAGING (ICG) (N/A) ENDOVEIN HARVEST OF GREATER SAPHENOUS VEIN (Right)  Subjective: Patient and I discussed importance of walking as he did NOT walk yesterday.  Objective: Vital signs in last 24 hours: Temp:  [97.6 F (36.4 C)-98.5 F (36.9 C)] 97.7 F (36.5 C) (07/11 0800) Pulse Rate:  [75-92] 81 (07/11 0800) Cardiac Rhythm: Normal sinus rhythm;Bundle branch block (07/11 0719) Resp:  [18-28] 20 (07/11 0800) BP: (92-134)/(64-91) 92/64 (07/11 0800) SpO2:  [95 %-100 %] 96 % (07/11 0800) Weight:  [113.7 kg] 113.7 kg (07/11 0359)  Pre op weight 112.2 kg Current Weight  05/25/20 113.7 kg      Intake/Output from previous day: 07/10 0701 - 07/11 0700 In: 1140 [P.O.:1140] Out: 2490 [Urine:1980; Chest Tube:510]   Physical Exam:  Cardiovascular: RRR Pulmonary: Slightly diminished bibasilar breath sounds Abdomen: Soft, obese, non tender, bowel sounds present. Extremities: ++ bilateral lower extremity edema. Wounds: Clean and dry.  No erythema or signs of infection.  Lab Results: CBC: Recent Labs    05/24/20 0325  WBC 11.0*  HGB 10.4*  HCT 31.9*  PLT 246   BMET:  Recent Labs    05/24/20 0325 05/25/20 0436  NA 137 137  K 3.2* 3.6  CL 99 100  CO2 31 29  GLUCOSE 100* 100*  BUN 15 14  CREATININE 0.97 0.94  CALCIUM 7.7* 7.8*    PT/INR:  Lab Results  Component Value Date   INR 1.8 (H) 05/17/2020   INR 2.0 (H) 05/17/2020   INR 2.1 (H) 05/16/2020   ABG:  INR: Will add last result for INR, ABG once components are confirmed Will add last 4 CBG results once components are confirmed  Assessment/Plan:  1. CV - Previous a fib.  Maintaining SR, first degree heart block. On Amiodarone 200 mg bid, Plavix 75 mg daily,  Lopressor 25 mg tid 2.  Pulmonary - On room air. Chest tubes with 510 cc last 24 hours. Chest tubes to remain. Check CXR in am. Encourage incentive spirometer 3.  Expected post op acute blood loss anemia - H and H stable at 10.4 and 31.9 4. Elevated transaminases improved yesterday: ALT 205, T bili 1.6 and I bili 1.2. Re check in am 5. Supplement potassium 6. Volume overload-on Lasix 60 mg IV bid but will decrease to 40 mg IV bid as BP a little labile and has had good diuresis last several days 7. Patient must ambulate  Donielle M ZimmermanPA-C 05/25/2020,9:49 AM   Agree with above Decreasing Lasix to 40 Chest tube output improved Spoke with family Corliss Skains

## 2020-05-25 NOTE — Progress Notes (Signed)
Pt ambulated twice in a hallway without SOB and chest pain, tolerated well, chest tubes output recorded, site CDI, PICC dressing done, wife is in bed side and updated, will continue to monitor the patient  Lonia Farber, RN

## 2020-05-25 NOTE — Plan of Care (Signed)

## 2020-05-26 LAB — GLUCOSE, CAPILLARY
Glucose-Capillary: 87 mg/dL (ref 70–99)
Glucose-Capillary: 83 mg/dL (ref 70–99)
Glucose-Capillary: 95 mg/dL (ref 70–99)
Glucose-Capillary: 90 mg/dL (ref 70–99)
Glucose-Capillary: 128 mg/dL — ABNORMAL HIGH (ref 70–99)

## 2020-05-26 LAB — HEPATIC FUNCTION PANEL
ALT: 113 U/L — ABNORMAL HIGH (ref 0–44)
AST: 35 U/L (ref 15–41)
Albumin: 2.5 g/dL — ABNORMAL LOW (ref 3.5–5.0)
Alkaline Phosphatase: 102 U/L (ref 38–126)
Bilirubin, Direct: 0.3 mg/dL — ABNORMAL HIGH (ref 0.0–0.2)
Indirect Bilirubin: 1 mg/dL — ABNORMAL HIGH (ref 0.3–0.9)
Total Bilirubin: 1.3 mg/dL — ABNORMAL HIGH (ref 0.3–1.2)
Total Protein: 5.1 g/dL — ABNORMAL LOW (ref 6.5–8.1)

## 2020-05-26 LAB — BASIC METABOLIC PANEL
Anion gap: 8 (ref 5–15)
BUN: 14 mg/dL (ref 8–23)
CO2: 29 mmol/L (ref 22–32)
Calcium: 8 mg/dL — ABNORMAL LOW (ref 8.9–10.3)
Chloride: 99 mmol/L (ref 98–111)
Creatinine, Ser: 1.09 mg/dL (ref 0.61–1.24)
GFR calc Af Amer: >60 mL/min (ref >60)
GFR calc non Af Amer: >60 mL/min (ref >60)
Glucose, Bld: 100 mg/dL — ABNORMAL HIGH (ref 70–99)
Potassium: 4.2 mmol/L (ref 3.5–5.1)
Sodium: 136 mmol/L (ref 135–145)

## 2020-05-26 MED FILL — Sodium Chloride IV Soln 0.9%: INTRAVENOUS | Qty: 4000 | Status: AC

## 2020-05-26 MED FILL — Heparin Sodium (Porcine) Inj 1000 Unit/ML: INTRAMUSCULAR | Qty: 10 | Status: AC

## 2020-05-26 MED FILL — Sodium Bicarbonate IV Soln 8.4%: INTRAVENOUS | Qty: 50 | Status: AC

## 2020-05-26 MED FILL — Albumin, Human Inj 5%: INTRAVENOUS | Qty: 250 | Status: AC

## 2020-05-26 MED FILL — Lidocaine HCl Local Soln Prefilled Syringe 100 MG/5ML (2%): INTRAMUSCULAR | Qty: 5 | Status: AC

## 2020-05-26 MED FILL — Electrolyte-R (PH 7.4) Solution: INTRAVENOUS | Qty: 6000 | Status: AC

## 2020-05-26 NOTE — Progress Notes (Signed)
CARDIAC REHAB PHASE I   PRE:  Rate/Rhythm: 78 SR    BP: lying 113/71    SaO2: 96 RA  MODE:  Ambulation: 300 ft   POST:  Rate/Rhythm: 112 ST    BP: sitting 119/89     SaO2: 99 RA  Pt much improved today. Able to get out of bed independently (but needed sternal reminders) and walk with RW, standby assist. Fairly steady, SOB with distance. Short rest stop. To BSC for BM. Pt happy. Sts he had slight dizziness but nothing like last week. Eager to walk x3 today.  [phone]  Harriet Masson CES, ACSM 05/26/2020 8:43 AM

## 2020-05-26 NOTE — Progress Notes (Signed)
[address]       [place] [address]             [phone]      10 Days Post-Op Procedure(s) (LRB): CORONARY ARTERY BYPASS GRAFTING (CABG) X 3 (N/A) AORTIC VALVE REPLACEMENT (AVR) USING 25 MM EDWARDS INTUITY ELITE VALVE (N/A) TRANSESOPHAGEAL ECHOCARDIOGRAM (TEE) (N/A) INDOCYANINE GREEN FLUORESCENCE IMAGING (ICG) (N/A) ENDOVEIN HARVEST OF GREATER SAPHENOUS VEIN (Right) Subjective: CT drainage only 70 overnight but still over 400 for the day  conts to diurese well   Objective: Vital signs in last 24 hours: Temp:  [97.6 F (36.4 C)-98.6 F (37 C)] 98.6 F (37 C) (07/12 0724) Pulse Rate:  [73-90] 74 (07/12 0724) Cardiac Rhythm: Normal sinus rhythm;Bundle branch block (07/12 0724) Resp:  [19-20] 20 (07/11 2346) BP: (92-129)/(56-86) 112/77 (07/12 0617) SpO2:  [96 %-100 %] 100 % (07/12 0724) Weight:  [117.1 kg] 117.1 kg (07/12 0330)  Hemodynamic parameters for last 24 hours:    Intake/Output from previous day: 07/11 0701 - 07/12 0700 In: 960 [P.O.:960] Out: 3014 [Urine:2600; Chest Tube:414] Intake/Output this shift: No intake/output data recorded.  General appearance: alert, cooperative and no distress Heart: regular rate and rhythm Lungs: clear to auscultation bilaterally Abdomen: benign Extremities: + edema, conts steady improvement Wound: incis healing well  Lab Results: Recent Labs    05/24/20 0325  WBC 11.0*  HGB 10.4*  HCT 31.9*  PLT 246   BMET:  Recent Labs    05/25/20 0436 05/26/20 0346  NA 137 136  K 3.6 4.2  CL 100 99  CO2 29 29  GLUCOSE 100* 100*  BUN 14 14  CREATININE 0.94 1.09  CALCIUM 7.8* 8.0*    PT/INR: No results for input(s): LABPROT, INR in the last 72 hours. ABG    Component Value Date/Time   PHART 7.459 (H) 05/19/2020 1218   HCO3 27.6 05/19/2020 1218   TCO2 29 05/19/2020 1218   ACIDBASEDEF 5.0 (H) 05/17/2020 0812   O2SAT 58.7 05/21/2020 0409   CBG (last 3)  Recent Labs    05/25/20 2008  05/25/20 2336 05/26/20 0411  GLUCAP 105* 100* 87    Meds Scheduled Meds: . allopurinol  100 mg Oral Daily  . amiodarone  200 mg Oral BID  . apixaban  5 mg Oral BID  . atorvastatin  40 mg Oral Daily  . Chlorhexidine Gluconate Cloth  6 each Topical Daily  . clopidogrel  75 mg Oral Daily  . colchicine  0.6 mg Oral BID  . furosemide  40 mg Intravenous BID  . guaiFENesin  600 mg Oral BID  . mouth rinse  15 mL Mouth Rinse BID  . metoprolol tartrate  25 mg Oral Q8H   Or  . metoprolol tartrate  25 mg Per Tube Q8H  . pantoprazole  40 mg Oral Daily  . potassium chloride  30 mEq Oral BID  . sodium chloride flush  10-40 mL Intracatheter Q12H  . thiamine  100 mg Oral Daily   Continuous Infusions: PRN Meds:.fentaNYL (SUBLIMAZE) injection, levalbuterol, metoprolol tartrate, ondansetron (ZOFRAN) IV, oxyCODONE, traMADol  Xrays DG CHEST PORT 1 VIEW  Result Date: 05/25/2020 CLINICAL DATA:  Pleural effusion EXAM: PORTABLE CHEST 1 VIEW COMPARISON:  Chest x-rays dated 05/22/2020 in 05/20/2020. FINDINGS: Stable cardiomegaly. Bilateral chest tubes are stable in position. Lungs are clear. No pleural effusion or pneumothorax is seen. RIGHT-sided PICC line is well position with tip at the level of the cavoatrial junction. IMPRESSION: 1. No  pleural effusion is seen. 2. Stable cardiomegaly. 3. Bilateral chest tubes are stable in position. Electronically Signed   By: Bary Richard M.D.   On: 05/25/2020 08:11    Assessment/Plan: S/P Procedure(s) (LRB): CORONARY ARTERY BYPASS GRAFTING (CABG) X 3 (N/A) AORTIC VALVE REPLACEMENT (AVR) USING 25 MM EDWARDS INTUITY ELITE VALVE (N/A) TRANSESOPHAGEAL ECHOCARDIOGRAM (TEE) (N/A) INDOCYANINE GREEN FLUORESCENCE IMAGING (ICG) (N/A) ENDOVEIN HARVEST OF GREATER SAPHENOUS VEIN (Right)  1 conts with steady overall progress 2 volume overload slow but steady improvement- cont current level of diuresis 3 sinus rhythm, VSS, sats good on RA 4 LFT's essentially normalized 5  normal renal fxn 6 cont therapies, CR when a little more stable and tubes out which should be soon   LOS: 11 days    Rowe Clack PA-C Pager [phone] 05/26/2020

## 2020-05-26 NOTE — Progress Notes (Signed)
Inpatient Rehab Admissions Coordinator:   Met with patient at bedside. He affirms continued interest in CIR admit. Pt. Is not quite medically ready and I do not have a bed for him today. Will continue to follow for admit later this week pending medical readiness and bed availability.   Clemens Catholic, [place], [place] Admissions Coordinator  [phone] ([place]) [phone] (office)

## 2020-05-26 NOTE — Progress Notes (Signed)
[place] changed

## 2020-05-26 NOTE — Progress Notes (Signed)
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  Pt has been admitted with CAD involving the native coronary arteries with unstable angina. 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  Pt weaned from vent 05/18/20.    PT Comments    Patient progressing with transitional movements and ambulation safety.  He still needs minguard to min A overall for mobility except mod A for sit to supine.  He reports improvement in dizziness and no nystagmus noted today with BPPV testing and no noted hypofunction with vestibular testing.  Patient could progress to home if continues on current trajectory, but today remains appropriate for CIR.  Discussions with pt/wife regarding options if home.  PT to continue to follow acutely, plan for attempting stairs next session.    Follow Up Recommendations  CIR     Equipment Recommendations  Rolling walker with 5" wheels;3in1 (PT);Other (comment)    Recommendations for Other Services       Precautions / Restrictions Precautions Precautions: Sternal;Fall Precaution Booklet Issued: No Precaution Comments: reminders for precautions, down to 1 chest tube Restrictions Other Position/Activity Restrictions: sternal precautions     Mobility  Bed Mobility Overal bed mobility: Needs Assistance Bed Mobility: Sit to Sidelying   Sidelying to sit: Min guard     Sit to sidelying: Mod assist General bed mobility comments: assist for legs into bed  Transfers Overall transfer level: Needs assistance Equipment used: Rolling walker (2 wheeled) Transfers: Sit to/from Stand Sit to Stand: Min guard;Min assist Stand pivot transfers: Min guard;Supervision       General transfer comment:  up from EOB and recliner with RW in front, hands on knees and minguard for balance, from 3:1 over toilet min A due to initial lift off unsuccessful and pt needing second attempt  Ambulation/Gait Ambulation/Gait assistance: Min guard Gait Distance (Feet): 150 Feet Assistive device: Rolling walker (2 wheeled) Gait Pattern/deviations: Step-through pattern     General Gait Details: no LOB with ambulation and on turns this session, noted 1 LOB with OT walking earlier today and L side LOB on L turn, but self corrected.   Stairs             Wheelchair Mobility    Modified Rankin (Stroke Patients Only)       Balance Overall balance assessment: Needs assistance Sitting-balance support: Feet supported Sitting balance-Leahy Scale: Good     Standing balance support: Bilateral upper extremity supported Standing balance-Leahy Scale: Poor Standing balance comment: UE support for balance                            Cognition Arousal/Alertness: Awake/alert Behavior During Therapy: WFL for tasks assessed/performed;Impulsive Overall Cognitive Status: Within Functional Limits for tasks assessed                                 General Comments: cues to carryover sternal precautions, slightly impulsive with movement needing cues for safety      Exercises      General Comments General comments (skin integrity, edema, etc.): wife in the room and supportive, discussed possibility of home  first and options if he does go home.   Vestibular Assessment - 05/26/20 0001      Symptom Behavior   Subjective history of current problem Reports dizziness much improved today, has had history of BPPV with daughter treating x 2 lately (last 6 months); recent dizziness  since surgery light headed/off balance feeling    Type of Dizziness  Imbalance;Lightheadedness    Frequency of Dizziness intermittent    Duration of Dizziness minutes    Symptom Nature Motion  provoked;Variable;Intermittent    Aggravating Factors Activity in general    Relieving Factors Rest    Progression of Symptoms Better    History of similar episodes as noted had BPPV recently, also recalls previous episodes he feels due to cardiac issues      Oculomotor Exam   Oculomotor Alignment Normal    Ocular ROM WNL    Spontaneous Absent    Gaze-induced  Absent    Head shaking Horizontal Absent    Smooth Pursuits Intact    Saccades Intact      Oculomotor Exam-Fixation Suppressed    Left Head Impulse Negative    Right Head Impulse Negative      Other Tests   Comments no difference left to Right today with scratch test      Positional Testing   Dix-Hallpike Dix-Hallpike Right;Dix-Hallpike Left   bed in trendelenberg     Dix-Hallpike Right   Dix-Hallpike Right Duration 45 sec    Dix-Hallpike Right Symptoms No nystagmus      Dix-Hallpike Left   Dix-Hallpike Left Duration 45 sec    Dix-Hallpike Left Symptoms No nystagmus              Pertinent Vitals/Pain Pain Assessment: Faces Faces Pain Scale: Hurts a little bit Pain Location: chest Pain Descriptors / Indicators: Operative site guarding Pain Intervention(s): Monitored during session    Home Living                      Prior Function            PT Goals (current goals can now be found in the care plan section) Acute Rehab PT Goals Patient Stated Goal: agreeable to rehab if needed Progress towards PT goals: Progressing toward goals    Frequency    Min 3X/week      PT Plan Current plan remains appropriate    Co-evaluation              AM-PAC PT "6 Clicks" Mobility   Outcome Measure  Help needed turning from your back to your side while in a flat bed without using bedrails?: A Little Help needed moving from lying on your back to sitting on the side of a flat bed without using bedrails?: A Little Help needed moving to and from a bed to a chair (including a wheelchair)?: A  Little Help needed standing up from a chair using your arms (e.g., wheelchair or bedside chair)?: A Little Help needed to walk in hospital room?: A Little Help needed climbing 3-5 steps with a railing? : A Lot 6 Click Score: 17    End of Session   Activity Tolerance: Patient tolerated treatment well Patient left: in bed;with call bell/phone within reach;with family/visitor present   PT Visit Diagnosis: Muscle weakness (generalized) (M62.81);Other abnormalities of gait and mobility (R26.89)     Time: 1420-1454 (&12:05-12:20) PT Time Calculation (min) (ACUTE ONLY): 34 min  Charges:  $Gait Training: 8-22 mins $Neuromuscular Re-education: 23-37  mins                     Sheran Lawless, [place] [facility] Pager:[phone] Office:[phone] 05/26/2020    Elray Mcgregor 05/26/2020, 5:12 PM

## 2020-05-26 NOTE — Progress Notes (Signed)
Y chest tube removed, sutures tied and dressing applied to both sites, patient tolerated well

## 2020-05-26 NOTE — Progress Notes (Addendum)
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  Pt has been admitted with CAD involving the native coronary arteries with unstable angina. 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  Pt weaned from vent 05/18/20.   OT comments  Pt making steady progress towards OT goals this session.Pt continues to present with decreased activity tolerance, balance impairments, and sternal precautions impacting pts ability to complete BADLS.  Session focus on functional mobility with RW, toileting and education related to maintaining sternal precautions. Overall, pt requires MINA for balance with RW. Pt required cues for safety related to RW mgmt during functional mobility as pt slightly impulsive during transition with RW. Education provided on compensatory methods related to maintaining sternal precautions during ADLs with pt verbalizing understanding. Pt currently requires supervision for UB ADLs and min guard for LB ADLs. DC plan currently remains appropriate, will follow acutely per POC.    Follow Up Recommendations  CIR    Equipment Recommendations  Tub/shower seat;Other (comment) (TBD in next venue)    Recommendations for Other Services      Precautions / Restrictions Precautions Precautions: Sternal;Fall Precaution Booklet Issued: No Precaution Comments: unaware of sternal precautions; 1 chest tube Restrictions Other Position/Activity Restrictions: sternal precautions        Mobility Bed Mobility Overal bed mobility: Needs Assistance Bed Mobility: Supine to Sit   Sidelying to sit: Min guard       General bed mobility comments: min guard for safety, cues for hand placement and technique to maintain  sternal precautions  Transfers Overall transfer level: Needs assistance Equipment used: Rolling walker (2 wheeled) Transfers: Sit to/from Stand Sit to Stand: Min guard;Supervision Stand pivot transfers: Min guard;Supervision       General transfer comment: pt sit<>stand from EOB needing x2 trials to power into standing d/t slight LOB backwards when attempting to power up. Pt able to stand from [facility] with supervision and cues for hand placement    Balance Overall balance assessment: Needs assistance Sitting-balance support: Feet supported Sitting balance-Leahy Scale: Fair     Standing balance support: Bilateral upper extremity supported Standing balance-Leahy Scale: Poor Standing balance comment: BUE support for mobility                           ADL either performed or assessed with clinical judgement   ADL Overall ADL's : Needs assistance/impaired     Grooming: Wash/dry hands;Sitting;Supervision/safety;Set up Grooming Details (indicate cue type and reason): to sanitize hand from chair             Lower Body Dressing: Min guard;Sitting/lateral leans Lower Body Dressing Details (indicate cue type and reason): able to don socks from EOB with min guard via figure four Toilet Transfer: Minimal assistance;Ambulation;RW;BSC;Regular Teacher, adult education Details (indicate cue type and reason): BSC over regular toilet, MINA for balance and line mgmt with Rw Toileting- Clothing Manipulation and Hygiene: Total assistance;Sit to/from stand Toileting - Clothing Manipulation Details (indicate cue type and reason): total A for posterior pericare via sit<>stand to maintain sternal precautions     Functional mobility during ADLs: Minimal assistance;Rolling walker General ADL Comments: pt with improved endurance and activity tolerance this session  Vision       Perception     Praxis      Cognition Arousal/Alertness: Awake/alert Behavior During Therapy: WFL for  tasks assessed/performed;Impulsive (impulsive related to mobility) Overall Cognitive Status: Within Functional Limits for tasks assessed                                 General Comments: cues to carryover sternal precautions, slightly impulsive with movement needing cues for safety         Exercises     Shoulder Instructions       General Comments wife present at end of session. VSS on RA. one standing rest break needing during mobility d/t fatigue RR up to 33 briefly during mobility     Pertinent Vitals/ Pain       Pain Assessment: No/denies pain  Home Living                                          Prior Functioning/Environment              Frequency  Min 2X/week        Progress Toward Goals  OT Goals(current goals can now be found in the care plan section)  Progress towards OT goals: Progressing toward goals  Acute Rehab OT Goals Patient Stated Goal: agreeable to rehab if needed OT Goal Formulation: With patient Time For Goal Achievement: 06-18-2020 Potential to Achieve Goals: Good  Plan Discharge plan remains appropriate;Frequency remains appropriate    Co-evaluation                 AM-PAC OT "6 Clicks" Daily Activity     Outcome Measure   Help from another person eating meals?: None Help from another person taking care of personal grooming?: A Little Help from another person toileting, which includes using toliet, bedpan, or urinal?: A Lot Help from another person bathing (including washing, rinsing, drying)?: A Lot Help from another person to put on and taking off regular upper body clothing?: A Little Help from another person to put on and taking off regular lower body clothing?: A Lot 6 Click Score: 16    End of Session Equipment Utilized During Treatment: Gait belt;Rolling walker  OT Visit Diagnosis: Other abnormalities of gait and mobility (R26.89);Muscle weakness (generalized) (M62.81)   Activity  Tolerance Patient tolerated treatment well   Patient Left in chair;with call bell/phone within reach;with family/visitor present   Nurse Communication Mobility status;Other (comment) (had BM)        Time: 6734-1937 OT Time Calculation (min): 27 min  Charges: OT General Charges $OT Visit: 1 Visit OT Treatments $Self Care/Home Management : 23-37 mins  Audery Amel., COTA/L Acute Rehabilitation Services [phone]    Angelina Pih 05/26/2020, 1:59 PM

## 2020-05-27 LAB — GLUCOSE, CAPILLARY
Glucose-Capillary: 103 mg/dL — ABNORMAL HIGH (ref 70–99)
Glucose-Capillary: 107 mg/dL — ABNORMAL HIGH (ref 70–99)
Glucose-Capillary: 130 mg/dL — ABNORMAL HIGH (ref 70–99)
Glucose-Capillary: 84 mg/dL (ref 70–99)
Glucose-Capillary: 89 mg/dL (ref 70–99)
Glucose-Capillary: 92 mg/dL (ref 70–99)
Glucose-Capillary: 93 mg/dL (ref 70–99)
Glucose-Capillary: 99 mg/dL (ref 70–99)

## 2020-05-27 MED ORDER — FUROSEMIDE 40 MG PO TABS
40.0000 mg | ORAL_TABLET | Freq: Every day | ORAL | Status: DC
  Administered 2020-05-27 – 2020-05-29 (×3): 40 mg via ORAL
  Filled 2020-05-27 (×3): qty 1

## 2020-05-27 MED ORDER — POTASSIUM CHLORIDE CRYS ER 20 MEQ PO TBCR
20.0000 meq | EXTENDED_RELEASE_TABLET | Freq: Every day | ORAL | Status: DC
  Administered 2020-05-27 – 2020-05-29 (×3): 20 meq via ORAL
  Filled 2020-05-27 (×3): qty 1

## 2020-05-27 NOTE — Progress Notes (Signed)
[address]       [place] [address]             [phone]      11 Days Post-Op Procedure(s) (LRB): CORONARY ARTERY BYPASS GRAFTING (CABG) X 3 (N/A) AORTIC VALVE REPLACEMENT (AVR) USING 25 MM EDWARDS INTUITY ELITE VALVE (N/A) TRANSESOPHAGEAL ECHOCARDIOGRAM (TEE) (N/A) INDOCYANINE GREEN FLUORESCENCE IMAGING (ICG) (N/A) ENDOVEIN HARVEST OF GREATER SAPHENOUS VEIN (Right) Subjective: Feels pretty well  Objective: Vital signs in last 24 hours: Temp:  [97.7 F (36.5 C)-98.6 F (37 C)] 98.1 F (36.7 C) (07/13 0357) Pulse Rate:  [74-85] 79 (07/13 0357) Cardiac Rhythm: Normal sinus rhythm (07/13 0357) Resp:  [18-20] 19 (07/13 0357) BP: (97-123)/(66-71) 108/71 (07/13 0357) SpO2:  [97 %-100 %] 98 % (07/13 0357) Weight:  [114.8 kg] 114.8 kg (07/13 0357)  Hemodynamic parameters for last 24 hours:    Intake/Output from previous day: 07/12 0701 - 07/13 0700 In: 970 [P.O.:960; I.V.:10] Out: 4150 [Urine:3775; Chest Tube:375] Intake/Output this shift: No intake/output data recorded.  General appearance: alert, cooperative and no distress Heart: regular rate and rhythm Lungs: clear to auscultation bilaterally Abdomen: benignb Extremities: edema is stable Wound: incis healing well  Lab Results: No results for input(s): WBC, HGB, HCT, PLT in the last 72 hours. BMET:  Recent Labs    05/25/20 0436 05/26/20 0346  NA 137 136  K 3.6 4.2  CL 100 99  CO2 29 29  GLUCOSE 100* 100*  BUN 14 14  CREATININE 0.94 1.09  CALCIUM 7.8* 8.0*    PT/INR: No results for input(s): LABPROT, INR in the last 72 hours. ABG    Component Value Date/Time   PHART 7.459 (H) 05/19/2020 1218   HCO3 27.6 05/19/2020 1218   TCO2 29 05/19/2020 1218   ACIDBASEDEF 5.0 (H) 05/17/2020 0812   O2SAT 58.7 05/21/2020 0409   CBG (last 3)  Recent Labs    05/26/20 1959 05/27/20 0007 05/27/20 0404  GLUCAP 128* 107* 89    Meds Scheduled Meds: . allopurinol  100 mg Oral Daily  .  amiodarone  200 mg Oral BID  . apixaban  5 mg Oral BID  . atorvastatin  40 mg Oral Daily  . Chlorhexidine Gluconate Cloth  6 each Topical Daily  . clopidogrel  75 mg Oral Daily  . colchicine  0.6 mg Oral BID  . furosemide  40 mg Intravenous BID  . guaiFENesin  600 mg Oral BID  . mouth rinse  15 mL Mouth Rinse BID  . metoprolol tartrate  25 mg Oral Q8H   Or  . metoprolol tartrate  25 mg Per Tube Q8H  . pantoprazole  40 mg Oral Daily  . potassium chloride  30 mEq Oral BID  . sodium chloride flush  10-40 mL Intracatheter Q12H  . thiamine  100 mg Oral Daily   Continuous Infusions: PRN Meds:.fentaNYL (SUBLIMAZE) injection, levalbuterol, metoprolol tartrate, ondansetron (ZOFRAN) IV, oxyCODONE, traMADol  Xrays No results found.  Assessment/Plan: S/P Procedure(s) (LRB): CORONARY ARTERY BYPASS GRAFTING (CABG) X 3 (N/A) AORTIC VALVE REPLACEMENT (AVR) USING 25 MM EDWARDS INTUITY ELITE VALVE (N/A) TRANSESOPHAGEAL ECHOCARDIOGRAM (TEE) (N/A) INDOCYANINE GREEN FLUORESCENCE IMAGING (ICG) (N/A) ENDOVEIN HARVEST OF GREATER SAPHENOUS VEIN (Right) 1 feels well overall, more comfortable with some of the CT's out- cont last CT with mod drainage 2 hemodyn stable in sinus rhythm 3 excellent diuresis , will switch to po lasix 40 q day, may need chronically or at least for a while 4  no new labs or CXR's  5 cont therapies, CIR soon 6 CBG's good control   LOS: 12 days    Rowe Clack PA-C Pager [phone] 05/27/2020

## 2020-05-27 NOTE — Plan of Care (Signed)
  Problem: Education: Goal: Knowledge of General Education information will improve Description: Including pain rating scale, medication(s)/side effects and non-pharmacologic comfort measures Outcome: Progressing   Problem: Health Behavior/Discharge Planning: Goal: Ability to manage health-related needs will improve Outcome: Progressing   Problem: Clinical Measurements: Goal: Will remain free from infection Outcome: Progressing Goal: Diagnostic test results will improve Outcome: Progressing Goal: Respiratory complications will improve Outcome: Progressing   Problem: Activity: Goal: Risk for activity intolerance will decrease Outcome: Progressing   Problem: Nutrition: Goal: Adequate nutrition will be maintained Outcome: Progressing   

## 2020-05-27 NOTE — Progress Notes (Signed)
CARDIAC REHAB PHASE I   PRE:  Rate/Rhythm: 72 SR    BP: sitting 112/72    SaO2: 98 RA  MODE:  Ambulation: 220 ft   POST:  Rate/Rhythm: 92 SR    BP: sitting 138/64     SaO2:   Pt in bed, eager to ambulate. Able to move to EOB with min assist and verbal cues. Stood independently on second attempt. Used RW in hall. Fairly steady, verbal cues to stand more upright, which pt was able to do today. No major c/o but pt had to hurry back to room for BM. Left on BSC. He is eager to ambulate x2 more today.  [phone]   Harriet Masson CES, ACSM 05/27/2020 8:37 AM

## 2020-05-28 LAB — GLUCOSE, CAPILLARY
Glucose-Capillary: 92 mg/dL (ref 70–99)
Glucose-Capillary: 142 mg/dL — ABNORMAL HIGH (ref 70–99)
Glucose-Capillary: 97 mg/dL (ref 70–99)
Glucose-Capillary: 122 mg/dL — ABNORMAL HIGH (ref 70–99)

## 2020-05-28 LAB — BASIC METABOLIC PANEL
Anion gap: 9 (ref 5–15)
BUN: 13 mg/dL (ref 8–23)
CO2: 27 mmol/L (ref 22–32)
Calcium: 8.4 mg/dL — ABNORMAL LOW (ref 8.9–10.3)
Chloride: 100 mmol/L (ref 98–111)
Creatinine, Ser: 0.98 mg/dL (ref 0.61–1.24)
GFR calc Af Amer: >60 mL/min (ref >60)
GFR calc non Af Amer: >60 mL/min (ref >60)
Glucose, Bld: 99 mg/dL (ref 70–99)
Potassium: 4.1 mmol/L (ref 3.5–5.1)
Sodium: 136 mmol/L (ref 135–145)

## 2020-05-28 NOTE — Progress Notes (Addendum)
[address]       [place] [address]             [phone]      12 Days Post-Op Procedure(s) (LRB): CORONARY ARTERY BYPASS GRAFTING (CABG) X 3 (N/A) AORTIC VALVE REPLACEMENT (AVR) USING 25 MM EDWARDS INTUITY ELITE VALVE (N/A) TRANSESOPHAGEAL ECHOCARDIOGRAM (TEE) (N/A) INDOCYANINE GREEN FLUORESCENCE IMAGING (ICG) (N/A) ENDOVEIN HARVEST OF GREATER SAPHENOUS VEIN (Right) Subjective: conts to look and feel better CT drainage 260 cc yesterday  Objective: Vital signs in last 24 hours: Temp:  [98 F (36.7 C)-98.2 F (36.8 C)] 98 F (36.7 C) (07/14 0340) Pulse Rate:  [71-93] 84 (07/14 0550) Cardiac Rhythm: Normal sinus rhythm (07/14 0357) Resp:  [12-20] 18 (07/14 0340) BP: (93-117)/(65-77) 93/70 (07/14 0340) SpO2:  [98 %-100 %] 100 % (07/14 0340) Weight:  [113.6 kg] 113.6 kg (07/14 0340)  Hemodynamic parameters for last 24 hours:    Intake/Output from previous day: 07/13 0701 - 07/14 0700 In: 120 [P.O.:120] Out: 1560 [Urine:1300; Chest Tube:260] Intake/Output this shift: No intake/output data recorded.  General appearance: alert, cooperative and no distress Heart: regular rate and rhythm Lungs: clear to auscultation bilaterally Abdomen: benign Extremities: edema conts to improve Wound: incis healing well  Lab Results: No results for input(s): WBC, HGB, HCT, PLT in the last 72 hours. BMET:  Recent Labs    05/26/20 0346 05/28/20 0312  NA 136 136  K 4.2 4.1  CL 99 100  CO2 29 27  GLUCOSE 100* 99  BUN 14 13  CREATININE 1.09 0.98  CALCIUM 8.0* 8.4*    PT/INR: No results for input(s): LABPROT, INR in the last 72 hours. ABG    Component Value Date/Time   PHART 7.459 (H) 05/19/2020 1218   HCO3 27.6 05/19/2020 1218   TCO2 29 05/19/2020 1218   ACIDBASEDEF 5.0 (H) 05/17/2020 0812   O2SAT 58.7 05/21/2020 0409   CBG (last 3)  Recent Labs    05/27/20 1930 05/27/20 2354 05/28/20 0344  GLUCAP 103* 92 92    Meds Scheduled Meds: .  allopurinol  100 mg Oral Daily  . amiodarone  200 mg Oral BID  . apixaban  5 mg Oral BID  . atorvastatin  40 mg Oral Daily  . Chlorhexidine Gluconate Cloth  6 each Topical Daily  . clopidogrel  75 mg Oral Daily  . colchicine  0.6 mg Oral BID  . furosemide  40 mg Oral Daily  . guaiFENesin  600 mg Oral BID  . mouth rinse  15 mL Mouth Rinse BID  . metoprolol tartrate  25 mg Oral Q8H   Or  . metoprolol tartrate  25 mg Per Tube Q8H  . pantoprazole  40 mg Oral Daily  . potassium chloride  20 mEq Oral Daily  . sodium chloride flush  10-40 mL Intracatheter Q12H  . thiamine  100 mg Oral Daily   Continuous Infusions: PRN Meds:.fentaNYL (SUBLIMAZE) injection, levalbuterol, metoprolol tartrate, ondansetron (ZOFRAN) IV, oxyCODONE, traMADol  Xrays No results found.  Assessment/Plan: S/P Procedure(s) (LRB): CORONARY ARTERY BYPASS GRAFTING (CABG) X 3 (N/A) AORTIC VALVE REPLACEMENT (AVR) USING 25 MM EDWARDS INTUITY ELITE VALVE (N/A) TRANSESOPHAGEAL ECHOCARDIOGRAM (TEE) (N/A) INDOCYANINE GREEN FLUORESCENCE IMAGING (ICG) (N/A) ENDOVEIN HARVEST OF GREATER SAPHENOUS VEIN (Right)  1 afeb, VSS 2 sats good on RA 3 volume status stable and improving 4 normal renal fxn 5 CT drainage decreasing 6 could go to CIR if will take with CT, hoping to remove in next  day or so   LOS: 13 days    Rowe Clack PA-C Pager [phone] 05/28/2020  Chest tube output remains significant in will be in place today check chest x-ray and CBC in a.m. CIR recommends discharge home  patient examined and medical record reviewed,agree with above note. Kathlee Nations Trigt III 05/28/2020

## 2020-05-28 NOTE — Progress Notes (Signed)
CARDIAC REHAB PHASE I   PRE:  Rate/Rhythm: 88 SR    BP: sitting 108/68    SaO2: 99 RA  MODE:  Ambulation: 200 ft   POST:  Rate/Rhythm: 115 ST    BP: sitting 125/76     SaO2: 99 RA  Pt eager to ambulate. Stood independently and ambulated with RW. Fairly steady in hall, reminders to stand tall. Pt had to urgently get to BR for diarrhea. Unsafe at times due to hurry and CT tube. Pt still having significant diarrhea. To recliner after BR, VSS. Will educate tomorrow with wife. [phone]   Sean Hubbard CES, ACSM 05/28/2020 2:35 PM

## 2020-05-28 NOTE — Progress Notes (Signed)
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  Pt has been admitted with CAD involving the native coronary arteries with unstable angina. 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  Pt weaned from vent 05/18/20.   OT comments  Pt progressing towards OT goals, now with plans for returning home vs rehab given steady progress. Much of session focus on educating/reviewing with both pt and pt's spouse re: sternal precautions in relation to ADL and functional tasks including compensatory techniques and use of AE. Pt/pt's spouse verbalizing understanding throughout. Pt performing functional transfers at [facility] assist level with RW and requiring modA for bed mobility. Have updated d/c recommendations given progress. He will benefit from follow up HHOT services to progress his safety and independence with ADL and mobility. Will continue to follow while acutely admitted.   Follow Up Recommendations  Home health OT;Supervision/Assistance - 24 hour    Equipment Recommendations  3 in 1 bedside commode          Precautions / Restrictions Precautions Precautions: Sternal;Fall Precaution Booklet Issued: Yes (comment) Precaution Comments: chest tube Restrictions Other Position/Activity Restrictions: sternal precautions        Mobility Bed Mobility Overal bed mobility: Needs Assistance Bed Mobility: Sit to Sidelying         Sit to sidelying: Mod assist General bed mobility comments: requiring assist for LEs onto EOB, verbally reviewed bed mobility techniques with pt's spouse   Transfers Overall transfer level: Needs assistance Equipment used: Rolling walker (2 wheeled) Transfers: Sit to/from  Stand Sit to Stand: Min guard         General transfer comment: for safety and balance, pt with good UE/hand placement     Balance Overall balance assessment: Mild deficits observed, not formally tested                                         ADL either performed or assessed with clinical judgement   ADL Overall ADL's : Needs assistance/impaired                   Upper Body Dressing Details (indicate cue type and reason): educated in methods for safely completing UB dressing while maintaining sternal precautions    Lower Body Dressing Details (indicate cue type and reason): discussed compensatory techniques for LB ADL, pt reports has been able to don/doff socks       Toileting - Clothing Manipulation Details (indicate cue type and reason): discussed compensatory techniques for toileting ADL - educated in use of toilet aide to increase safety and independence with task, educated in resources for obtaining AE   Tub/Shower Transfer Details (indicate cue type and reason): dicussed use of 3:1 as shower seat for increased safety and for energy conservation  Functional mobility during ADLs: Min guard;Rolling walker General ADL Comments: much of session spent on educating pt/pt's spouse re: safety and compensatory strategies for completing ADL while maintaining sternal precautions, in prep for anticipated d/c home                        Cognition  Arousal/Alertness: Awake/alert Behavior During Therapy: WFL for tasks assessed/performed Overall Cognitive Status: Within Functional Limits for tasks assessed                                          Exercises Exercises: Other exercises Other Exercises Other Exercises: encouraged continued IS and flutter valve use   Shoulder Instructions       General Comments      Pertinent Vitals/ Pain       Pain Assessment: No/denies pain  Home Living                                           Prior Functioning/Environment              Frequency  Min 2X/week        Progress Toward Goals  OT Goals(current goals can now be found in the care plan section)  Progress towards OT goals: Progressing toward goals  Acute Rehab OT Goals Patient Stated Goal: ready for home to his dogs OT Goal Formulation: With patient Time For Goal Achievement: 06-24-20 Potential to Achieve Goals: Good ADL Goals Pt Will Perform Grooming: with supervision;sitting;standing Pt Will Perform Lower Body Bathing: with supervision;sitting/lateral leans;sit to/from stand Pt Will Perform Upper Body Dressing: with set-up;with supervision;sitting Pt Will Perform Lower Body Dressing: with supervision;sit to/from stand;sitting/lateral leans Pt Will Transfer to Toilet: with supervision;ambulating Pt Will Perform Toileting - Clothing Manipulation and hygiene: with supervision;sit to/from stand;sitting/lateral leans Additional ADL Goal #1: Pt will peform bed mobility at supervision level as precursor to EOB/OOB ADL.  Plan Discharge plan needs to be updated    Co-evaluation                 AM-PAC OT "6 Clicks" Daily Activity     Outcome Measure   Help from another person eating meals?: None Help from another person taking care of personal grooming?: A Little Help from another person toileting, which includes using toliet, bedpan, or urinal?: A Lot Help from another person bathing (including washing, rinsing, drying)?: A Lot Help from another person to put on and taking off regular upper body clothing?: A Little Help from another person to put on and taking off regular lower body clothing?: A Lot 6 Click Score: 16    End of Session Equipment Utilized During Treatment: Rolling walker  OT Visit Diagnosis: Other abnormalities of gait and mobility (R26.89);Muscle weakness (generalized) (M62.81)   Activity Tolerance Patient tolerated treatment well   Patient Left in bed;with call  bell/phone within reach;with family/visitor present   Nurse Communication Mobility status        Time: 1442-1510 OT Time Calculation (min): 28 min  Charges: OT General Charges $OT Visit: 1 Visit OT Treatments $Self Care/Home Management : 23-37 mins  Marcy Siren, OT Acute Rehabilitation Services Pager [phone] Office [phone]    Orlando Penner 05/28/2020, 5:13 PM

## 2020-05-28 NOTE — Progress Notes (Signed)
Inpatient Rehabilitation Admissions Coordinator  Patient has progressed to be able to go home with [facility]. We will sign off at this time.  Ottie Glazier, RN, MSN Rehab Admissions Coordinator [phone] 05/28/2020 1:39 PM

## 2020-05-28 NOTE — Progress Notes (Signed)
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  Pt has been admitted with CAD involving the native coronary arteries with unstable angina. 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  Pt weaned from vent 05/18/20.    PT Comments    Pt very pleasant sitting in chair on arrival. Pt reports he feels confident in return home at this point and has been walking long hall distance. Pt able to report 3 sternal precautions with education for all and handout provided. Pt performed stairs with rail and guarding with education for safety. Pt with tendency to overly bear weight on bil UE with gait and benefits from cues for upright posture and stability with RW. Pt educated for HEP and encouraged to continue. Pt asking about returning to golf and fishing with education for precautions prohibiting that.  HR 83-100 Pre BP 105/66 (78) Post 120/80   Follow Up Recommendations  Home health PT     Equipment Recommendations  Rolling walker with 5" wheels;3in1 (PT)    Recommendations for Other Services       Precautions / Restrictions Precautions Precautions: Sternal;Fall Precaution Booklet Issued: Yes (comment) Precaution Comments: chest tube    Mobility  Bed Mobility               General bed mobility comments: in chair on arrival and end of session  Transfers Overall transfer level: Needs assistance     Sit to Stand: Min guard         General transfer comment: guarding for cues for hand placement pt instinctively reaching for surface to sit and stand  Ambulation/Gait Ambulation/Gait assistance: Min guard Gait Distance (Feet): 400 Feet Assistive device: Rolling walker (2 wheeled) Gait  Pattern/deviations: Step-through pattern;Decreased stride length;Trunk flexed   Gait velocity interpretation: >4.37 ft/sec, indicative of normal walking speed General Gait Details: cues for posture and proximity to RW with decreased weight on bil UE with gait   Stairs Stairs: Yes Stairs assistance: Min guard Stair Management: Step to pattern;Forwards;One rail Right Number of Stairs: 3 General stair comments: pt with use of rail, guarding for stability with cues with partial imbalance x 1 with descent   Wheelchair Mobility    Modified Rankin (Stroke Patients Only)       Balance Overall balance assessment: Mild deficits observed, not formally tested                                          Cognition Arousal/Alertness: Awake/alert Behavior During Therapy: WFL for tasks assessed/performed Overall Cognitive Status: Within Functional Limits for tasks assessed                                        Exercises General Exercises - Lower Extremity Long Arc Quad: AROM;Both;Seated;10 reps Hip Flexion/Marching: AROM;Both;Seated;10 reps    General Comments        Pertinent Vitals/Pain Pain Assessment: No/denies pain Pain Intervention(s): Limited activity within patient's tolerance;Monitored during session;Repositioned    Home Living  Prior Function            PT Goals (current goals can now be found in the care plan section) Progress towards PT goals: Progressing toward goals    Frequency    Min 3X/week      PT Plan Discharge plan needs to be updated    Co-evaluation              AM-PAC PT "6 Clicks" Mobility   Outcome Measure  Help needed turning from your back to your side while in a flat bed without using bedrails?: A Little Help needed moving from lying on your back to sitting on the side of a flat bed without using bedrails?: A Little Help needed moving to and from a bed to a chair  (including a wheelchair)?: A Little Help needed standing up from a chair using your arms (e.g., wheelchair or bedside chair)?: A Little Help needed to walk in hospital room?: A Little Help needed climbing 3-5 steps with a railing? : A Little 6 Click Score: 18    End of Session   Activity Tolerance: Patient tolerated treatment well Patient left: in chair;with call bell/phone within reach Nurse Communication: Mobility status PT Visit Diagnosis: Muscle weakness (generalized) (M62.81);Other abnormalities of gait and mobility (R26.89)     Time: 7341-9379 PT Time Calculation (min) (ACUTE ONLY): 27 min  Charges:  $Gait Training: 8-22 mins $Therapeutic Activity: 8-22 mins                     Gared Gillie P, PT Acute Rehabilitation Services Pager: [phone] Office: [phone]    Janisse Ghan B Henri Guedes 05/28/2020, 10:50 AM

## 2020-05-28 NOTE — Progress Notes (Signed)
Inpatient Rehabilitation Admissions Coordinator  I will follow up today after therapy assessments. Once chest tube out, patient may progress to discharge directly home.  Ottie Glazier, RN, MSN Rehab Admissions Coordinator [phone] 05/28/2020 8:39 AM

## 2020-05-29 ENCOUNTER — Inpatient Hospital Stay (HOSPITAL_COMMUNITY): Payer: Medicare Other

## 2020-05-29 LAB — CBC
HCT: 35.2 % — ABNORMAL LOW (ref 39.0–52.0)
Hemoglobin: 11.1 g/dL — ABNORMAL LOW (ref 13.0–17.0)
MCH: 28.5 pg (ref 26.0–34.0)
MCHC: 31.5 g/dL (ref 30.0–36.0)
MCV: 90.3 fL (ref 80.0–100.0)
Platelets: 350 10*3/uL (ref 150–400)
RBC: 3.9 MIL/uL — ABNORMAL LOW (ref 4.22–5.81)
RDW: 15.2 % (ref 11.5–15.5)
WBC: 9 10*3/uL (ref 4.0–10.5)
nRBC: 0 % (ref 0.0–0.2)

## 2020-05-29 LAB — GLUCOSE, CAPILLARY
Glucose-Capillary: 84 mg/dL (ref 70–99)
Glucose-Capillary: 104 mg/dL — ABNORMAL HIGH (ref 70–99)

## 2020-05-29 MED ORDER — POTASSIUM CHLORIDE CRYS ER 20 MEQ PO TBCR: 20.0000 meq | EXTENDED_RELEASE_TABLET | Freq: Every day | ORAL | 0 refills | Status: AC

## 2020-05-29 MED ORDER — AMIODARONE HCL 200 MG PO TABS: 200.0000 mg | ORAL_TABLET | Freq: Two times a day (BID) | ORAL | 1 refills | Status: AC

## 2020-05-29 MED ORDER — TRAMADOL HCL 50 MG PO TABS: 50.0000 mg | ORAL_TABLET | Freq: Four times a day (QID) | ORAL | 0 refills | Status: DC | PRN

## 2020-05-29 MED ORDER — FUROSEMIDE 40 MG PO TABS: 40.0000 mg | ORAL_TABLET | Freq: Every day | ORAL | 0 refills | Status: AC

## 2020-05-29 MED ORDER — CLOPIDOGREL BISULFATE 75 MG PO TABS: 75.0000 mg | ORAL_TABLET | Freq: Every day | ORAL | 1 refills | Status: AC

## 2020-05-29 MED ORDER — COLCHICINE 0.6 MG PO TABS: 0.6000 mg | ORAL_TABLET | Freq: Two times a day (BID) | ORAL | 1 refills | Status: AC

## 2020-05-29 MED ORDER — APIXABAN 5 MG PO TABS: 5.0000 mg | ORAL_TABLET | Freq: Two times a day (BID) | ORAL | 1 refills | Status: AC

## 2020-05-29 NOTE — Progress Notes (Signed)
RUA PICC d/c'ed per order.  Pt verbalizes understanding of interventions and signs of bleeding infection and dressing removal and the need to notify MD if any occur.  Also aware to remain in bed  30 minutes.  Teach back method utilized.  Site with bruising noted but no signs of infection.  Site  Cleaned with CHG, vaseline and dry 2x2 applied.  Pressure held x 5 minutes with no bleeding present.  Rosalita Chessman RN notified.

## 2020-05-29 NOTE — Progress Notes (Signed)
Discussed with pt and wife sternal precautions, IS, exercise, diet, and CRPII. Receptive. Will refer to [facility] CRPII.  [id number] Ethelda Chick CES, ACSM 1:27 PM 05/29/2020

## 2020-05-29 NOTE — Progress Notes (Signed)
[address]       [place] [address]             [phone]      13 Days Post-Op Procedure(s) (LRB): CORONARY ARTERY BYPASS GRAFTING (CABG) X 3 (N/A) AORTIC VALVE REPLACEMENT (AVR) USING 25 MM EDWARDS INTUITY ELITE VALVE (N/A) TRANSESOPHAGEAL ECHOCARDIOGRAM (TEE) (N/A) INDOCYANINE GREEN FLUORESCENCE IMAGING (ICG) (N/A) ENDOVEIN HARVEST OF GREATER SAPHENOUS VEIN (Right) Subjective Chest tube only 140 cc yesterday He feels well  Objective: Vital signs in last 24 hours: Temp:  [97.6 F (36.4 C)-98 F (36.7 C)] 97.6 F (36.4 C) (07/15 0453) Pulse Rate:  [78-92] 80 (07/15 0609) Cardiac Rhythm: Normal sinus rhythm (07/15 0358) Resp:  [15-20] 19 (07/15 0453) BP: (94-120)/(64-85) 107/68 (07/15 0609) SpO2:  [94 %-100 %] 98 % (07/15 0453) Weight:  [112.8 kg] 112.8 kg (07/15 0609)  Hemodynamic parameters for last 24 hours:    Intake/Output from previous day: 07/14 0701 - 07/15 0700 In: 720 [P.O.:700; I.V.:20] Out: 640 [Urine:500; Chest Tube:140] Intake/Output this shift: No intake/output data recorded.  General appearance: alert, cooperative and no distress Heart: regular rate and rhythm Lungs: clear to auscultation bilaterally Abdomen: benign Extremities: + edema Wound: incis healing well  Lab Results: Recent Labs    05/29/20 0248  WBC 9.0  HGB 11.1*  HCT 35.2*  PLT 350   BMET:  Recent Labs    05/28/20 0312  NA 136  K 4.1  CL 100  CO2 27  GLUCOSE 99  BUN 13  CREATININE 0.98  CALCIUM 8.4*    PT/INR: No results for input(s): LABPROT, INR in the last 72 hours. ABG    Component Value Date/Time   PHART 7.459 (H) 05/19/2020 1218   HCO3 27.6 05/19/2020 1218   TCO2 29 05/19/2020 1218   ACIDBASEDEF 5.0 (H) 05/17/2020 0812   O2SAT 58.7 05/21/2020 0409   CBG (last 3)  Recent Labs    05/28/20 1140 05/28/20 1617 05/29/20 0455  GLUCAP 97 122* 84    Meds Scheduled Meds: . allopurinol  100 mg Oral Daily  . amiodarone  200 mg  Oral BID  . apixaban  5 mg Oral BID  . atorvastatin  40 mg Oral Daily  . Chlorhexidine Gluconate Cloth  6 each Topical Daily  . clopidogrel  75 mg Oral Daily  . colchicine  0.6 mg Oral BID  . furosemide  40 mg Oral Daily  . guaiFENesin  600 mg Oral BID  . mouth rinse  15 mL Mouth Rinse BID  . metoprolol tartrate  25 mg Oral Q8H   Or  . metoprolol tartrate  25 mg Per Tube Q8H  . pantoprazole  40 mg Oral Daily  . potassium chloride  20 mEq Oral Daily  . sodium chloride flush  10-40 mL Intracatheter Q12H  . thiamine  100 mg Oral Daily   Continuous Infusions: PRN Meds:.levalbuterol, metoprolol tartrate, ondansetron (ZOFRAN) IV, oxyCODONE, traMADol  Xrays No results found.  Assessment/Plan: S/P Procedure(s) (LRB): CORONARY ARTERY BYPASS GRAFTING (CABG) X 3 (N/A) AORTIC VALVE REPLACEMENT (AVR) USING 25 MM EDWARDS INTUITY ELITE VALVE (N/A) TRANSESOPHAGEAL ECHOCARDIOGRAM (TEE) (N/A) INDOCYANINE GREEN FLUORESCENCE IMAGING (ICG) (N/A) ENDOVEIN HARVEST OF GREATER SAPHENOUS VEIN (Right)  1 afeb, VSS 2 sats good on RA 3 CT drainage 140 cc- CXR ok, will remove tube this am 4 H/H improved, no leukocytosis 5 BS well controlled 6 will remove tube and he is stable for d/c today     LOS:  14 days    Rowe Clack PA-C Pager [phone] 05/29/2020

## 2020-05-29 NOTE — TOC Transition Note (Addendum)
Transition of Care [facility]) - CM/SW Discharge Note   Patient Details  Name: Sean Hubbard MRN: [id number] Date of Birth: 05-14-1941  [facility] of [facility]) CM/SW Contact:  Leone Haven, RN Phone Number: 05/29/2020, 11:46 AM   Clinical Narrative:    Patient is for dc today, NCM offered choice , he states he does not have a preference.  NCM made referral to [facility] with [facility].  She is able to take referral. Soc will begin 24 to 48 hrs post dc. He will need rolling walker and 3 n 1 also, referral was made to [facility] with [facility].   Final next level of care: Home w Home Health Services Barriers to Discharge: No Barriers Identified   Patient Goals and CMS Choice Patient states their goals for this hospitalization and ongoing recovery are:: get better CMS Medicare.gov Compare Post Acute Care list provided to:: Patient Choice offered to / list presented to : Patient  Discharge Placement                       Discharge Plan and Services                DME Arranged: Walker rolling, 3-N-1 DME Agency: [facility] Date DME Agency Contacted: 05/29/20 Time DME Agency Contacted: 1100 Representative spoke with at DME Agency: Ian Malkin HH Arranged: PT HH Agency: [facility] Date [facility] Agency Contacted: 05/29/20 Time HH Agency Contacted: 1145 Representative spoke with at [facility] Agency: Elnita Maxwell  Social Determinants of Health (SDOH) Interventions     Readmission Risk Interventions No flowsheet data found.

## 2020-05-29 NOTE — Plan of Care (Signed)
Problem: Education: Goal: Knowledge of General Education information will improve Description: Including pain rating scale, medication(s)/side effects and non-pharmacologic comfort measures Outcome: Progressing   Problem: Health Behavior/Discharge Planning: Goal: Ability to manage health-related needs will improve Outcome: Progressing   Problem: Clinical Measurements: Goal: Ability to maintain clinical measurements within normal limits will improve Outcome: Progressing Goal: Will remain free from infection Outcome: Progressing Goal: Diagnostic test results will improve Outcome: Progressing Goal: Respiratory complications will improve Outcome: Progressing Goal: Cardiovascular complication will be avoided Outcome: Progressing   Problem: Activity: Goal: Risk for activity intolerance will decrease Outcome: Progressing   Problem: Coping: Goal: Level of anxiety will decrease Outcome: Progressing   Problem: Nutrition: Goal: Adequate nutrition will be maintained Outcome: Progressing   Problem: Elimination: Goal: Will not experience complications related to bowel motility Outcome: Progressing Goal: Will not experience complications related to urinary retention Outcome: Progressing   Problem: Pain Managment: Goal: General experience of comfort will improve Outcome: Progressing   Problem: Safety: Goal: Ability to remain free from injury will improve Outcome: Progressing   Problem: Skin Integrity: Goal: Risk for impaired skin integrity will decrease Outcome: Progressing   Problem: Education: Goal: Will demonstrate proper wound care and an understanding of methods to prevent future damage Outcome: Progressing Goal: Knowledge of disease or condition will improve Outcome: Progressing Goal: Knowledge of the prescribed therapeutic regimen will improve Outcome: Progressing Goal: Individualized Educational Video(s) Outcome: Progressing   Problem: Activity: Goal: Risk for  activity intolerance will decrease Outcome: Progressing   Problem: Cardiac: Goal: Will achieve and/or maintain hemodynamic stability Outcome: Progressing   Problem: Clinical Measurements: Goal: Postoperative complications will be avoided or minimized Outcome: Progressing   Problem: Respiratory: Goal: Respiratory status will improve Outcome: Progressing   Problem: Skin Integrity: Goal: Wound healing without signs and symptoms of infection Outcome: Progressing Goal: Risk for impaired skin integrity will decrease Outcome: Progressing   Problem: Urinary Elimination: Goal: Ability to achieve and maintain adequate renal perfusion and functioning will improve Outcome: Progressing   Problem: Education: Goal: Will demonstrate proper wound care and an understanding of methods to prevent future damage Outcome: Progressing Goal: Knowledge of disease or condition will improve Outcome: Progressing Goal: Knowledge of the prescribed therapeutic regimen will improve Outcome: Progressing Goal: Individualized Educational Video(s) Outcome: Progressing   Problem: Activity: Goal: Risk for activity intolerance will decrease Outcome: Progressing   Problem: Cardiac: Goal: Will achieve and/or maintain hemodynamic stability Outcome: Progressing   Problem: Clinical Measurements: Goal: Postoperative complications will be avoided or minimized Outcome: Progressing   Problem: Respiratory: Goal: Respiratory status will improve Outcome: Progressing   Problem: Skin Integrity: Goal: Wound healing without signs and symptoms of infection Outcome: Progressing Goal: Risk for impaired skin integrity will decrease Outcome: Progressing   Problem: Urinary Elimination: Goal: Ability to achieve and maintain adequate renal perfusion and functioning will improve Outcome: Progressing   Problem: Education: Goal: Will demonstrate proper wound care and an understanding of methods to prevent future  damage Outcome: Progressing Goal: Knowledge of disease or condition will improve Outcome: Progressing Goal: Knowledge of the prescribed therapeutic regimen will improve Outcome: Progressing Goal: Individualized Educational Video(s) Outcome: Progressing   Problem: Activity: Goal: Risk for activity intolerance will decrease Outcome: Progressing   Problem: Cardiac: Goal: Will achieve and/or maintain hemodynamic stability Outcome: Progressing   Problem: Clinical Measurements: Goal: Postoperative complications will be avoided or minimized Outcome: Progressing   Problem: Respiratory: Goal: Respiratory status will improve Outcome: Progressing   Problem: Skin Integrity: Goal: Wound healing without signs and  symptoms of infection Outcome: Progressing Goal: Risk for impaired skin integrity will decrease Outcome: Progressing   Problem: Urinary Elimination: Goal: Ability to achieve and maintain adequate renal perfusion and functioning will improve Outcome: Progressing

## 2020-05-29 NOTE — Plan of Care (Signed)
Problem: Education: Goal: Knowledge of General Education information will improve Description: Including pain rating scale, medication(s)/side effects and non-pharmacologic comfort measures Outcome: Progressing   Problem: Health Behavior/Discharge Planning: Goal: Ability to manage health-related needs will improve Outcome: Progressing   Problem: Clinical Measurements: Goal: Ability to maintain clinical measurements within normal limits will improve Outcome: Progressing Goal: Will remain free from infection Outcome: Progressing Goal: Diagnostic test results will improve Outcome: Progressing Goal: Respiratory complications will improve Outcome: Progressing Goal: Cardiovascular complication will be avoided Outcome: Progressing   Problem: Activity: Goal: Risk for activity intolerance will decrease Outcome: Progressing   Problem: Nutrition: Goal: Adequate nutrition will be maintained Outcome: Progressing   Problem: Coping: Goal: Level of anxiety will decrease Outcome: Progressing   Problem: Elimination: Goal: Will not experience complications related to bowel motility Outcome: Progressing Goal: Will not experience complications related to urinary retention Outcome: Progressing   Problem: Pain Managment: Goal: General experience of comfort will improve Outcome: Progressing   Problem: Safety: Goal: Ability to remain free from injury will improve Outcome: Progressing   Problem: Skin Integrity: Goal: Risk for impaired skin integrity will decrease Outcome: Progressing   Problem: Education: Goal: Will demonstrate proper wound care and an understanding of methods to prevent future damage Outcome: Progressing Goal: Knowledge of disease or condition will improve Outcome: Progressing Goal: Knowledge of the prescribed therapeutic regimen will improve Outcome: Progressing Goal: Individualized Educational Video(s) Outcome: Progressing   Problem: Activity: Goal: Risk for  activity intolerance will decrease Outcome: Progressing   Problem: Cardiac: Goal: Will achieve and/or maintain hemodynamic stability Outcome: Progressing   Problem: Clinical Measurements: Goal: Postoperative complications will be avoided or minimized Outcome: Progressing   Problem: Respiratory: Goal: Respiratory status will improve Outcome: Progressing   Problem: Skin Integrity: Goal: Wound healing without signs and symptoms of infection Outcome: Progressing Goal: Risk for impaired skin integrity will decrease Outcome: Progressing   Problem: Urinary Elimination: Goal: Ability to achieve and maintain adequate renal perfusion and functioning will improve Outcome: Progressing   Problem: Education: Goal: Will demonstrate proper wound care and an understanding of methods to prevent future damage Outcome: Progressing Goal: Knowledge of disease or condition will improve Outcome: Progressing Goal: Knowledge of the prescribed therapeutic regimen will improve Outcome: Progressing Goal: Individualized Educational Video(s) Outcome: Progressing   Problem: Activity: Goal: Risk for activity intolerance will decrease Outcome: Progressing   Problem: Cardiac: Goal: Will achieve and/or maintain hemodynamic stability Outcome: Progressing   Problem: Clinical Measurements: Goal: Postoperative complications will be avoided or minimized Outcome: Progressing   Problem: Respiratory: Goal: Respiratory status will improve Outcome: Progressing   Problem: Skin Integrity: Goal: Wound healing without signs and symptoms of infection Outcome: Progressing Goal: Risk for impaired skin integrity will decrease Outcome: Progressing   Problem: Urinary Elimination: Goal: Ability to achieve and maintain adequate renal perfusion and functioning will improve Outcome: Progressing   Problem: Education: Goal: Will demonstrate proper wound care and an understanding of methods to prevent future  damage Outcome: Progressing Goal: Knowledge of disease or condition will improve Outcome: Progressing Goal: Knowledge of the prescribed therapeutic regimen will improve Outcome: Progressing Goal: Individualized Educational Video(s) Outcome: Progressing   Problem: Activity: Goal: Risk for activity intolerance will decrease Outcome: Progressing   Problem: Cardiac: Goal: Will achieve and/or maintain hemodynamic stability Outcome: Progressing   Problem: Clinical Measurements: Goal: Postoperative complications will be avoided or minimized Outcome: Progressing   Problem: Respiratory: Goal: Respiratory status will improve Outcome: Progressing   Problem: Skin Integrity: Goal: Wound healing without signs and  symptoms of infection Outcome: Progressing Goal: Risk for impaired skin integrity will decrease Outcome: Progressing   Problem: Urinary Elimination: Goal: Ability to achieve and maintain adequate renal perfusion and functioning will improve Outcome: Progressing

## 2020-06-02 ENCOUNTER — Ambulatory Visit (INDEPENDENT_AMBULATORY_CARE_PROVIDER_SITE_OTHER): Payer: Self-pay | Admitting: Cardiothoracic Surgery

## 2020-06-02 ENCOUNTER — Ambulatory Visit
Admission: RE | Admit: 2020-06-02 | Discharge: 2020-06-02 | Disposition: A | Discharge status: Home / Self Care | Payer: Medicare Other | Source: Ambulatory Visit | Attending: Cardiothoracic Surgery | Admitting: Cardiothoracic Surgery

## 2020-06-02 ENCOUNTER — Other Ambulatory Visit: Payer: Self-pay

## 2020-06-02 ENCOUNTER — Other Ambulatory Visit: Payer: Self-pay | Admitting: Cardiothoracic Surgery

## 2020-06-02 ENCOUNTER — Encounter: Payer: Self-pay | Admitting: Cardiothoracic Surgery

## 2020-06-02 VITALS — BP 112/75 | HR 84 | Temp 97.7°F | Resp 18 | Wt 239.0 lb

## 2020-06-02 DIAGNOSIS — Z951 Presence of aortocoronary bypass graft: Secondary | ICD-10-CM

## 2020-06-02 MED ORDER — TAMSULOSIN HCL 0.4 MG PO CAPS: 0.4000 mg | ORAL_CAPSULE | Freq: Every day | ORAL | Status: AC

## 2020-06-02 NOTE — Progress Notes (Signed)
[address]       [place]an [place] [address]             [phone]     CARDIOTHORACIC SURGERY OFFICE NOTE  Referring Provider is Mariah Milling, Tollie Pizza, MD Primary Cardiologist is Julien Nordmann, MD PCP is Lynnea Ferrier, MD   HPI:  79 yo man s/p AVR/CABG presents for initial visit after discharge from hospital. He c/o cough that usually occurs with lying down. Denies f/c; denies chest pain.    Current Outpatient Medications  Medication Sig Dispense Refill  . allopurinol (ZYLOPRIM) 100 MG tablet Take 100 mg by mouth daily.    Marland Kitchen amiodarone (PACERONE) 200 MG tablet Take 1 tablet (200 mg total) by mouth 2 (two) times daily. 60 tablet 1  . apixaban (ELIQUIS) 5 MG TABS tablet Take 1 tablet (5 mg total) by mouth 2 (two) times daily. 60 tablet 1  . atorvastatin (LIPITOR) 40 MG tablet Take 40 mg by mouth daily at 6 PM.     . cholecalciferol (VITAMIN D) 25 MCG (1000 UNIT) tablet Take 1,000 Units by mouth daily.    . clopidogrel (PLAVIX) 75 MG tablet Take 1 tablet (75 mg total) by mouth daily. 30 tablet 1  . colchicine 0.6 MG tablet Take 1 tablet (0.6 mg total) by mouth 2 (two) times daily. 60 tablet 1  . ezetimibe (ZETIA) 10 MG tablet Take 1 tablet (10 mg total) by mouth daily. Please call to schedule appointment for further refills. Thank you! (Patient taking differently: Take 10 mg by mouth daily at 6 PM. ) 30 tablet 0  . furosemide (LASIX) 40 MG tablet Take 1 tablet (40 mg total) by mouth daily. 30 tablet 0  . KRILL OIL PO Take 750 mg by mouth daily.    . metoprolol succinate (TOPROL-XL) 50 MG 24 hr tablet Take 50 mg by mouth every evening. Take with or immediately following a meal.     . Multiple Vitamins-Minerals (MULTIVITAMIN WITH MINERALS) tablet Take 1 tablet by mouth daily.    Marland Kitchen omeprazole (PRILOSEC) 20 MG capsule Take 20 mg by mouth daily.     Marland Kitchen OVER THE COUNTER MEDICATION Apply 1 application topically 2 (two) times daily. Vidacann  Arthritis cream    . potassium  chloride SA (KLOR-CON) 20 MEQ tablet Take 1 tablet (20 mEq total) by mouth daily. 30 tablet 0  . traMADol (ULTRAM) 50 MG tablet Take 1 tablet (50 mg total) by mouth every 6 (six) hours as needed for moderate pain. 28 tablet 0  . Ascorbic Acid (VITAMIN C) 1000 MG tablet Take 1,000 mg by mouth daily. (Patient not taking: Reported on 06/02/2020)    . Coenzyme Q10 (CO Q 10) 100 MG CAPS Take 100 mg by mouth daily. (Patient not taking: Reported on 06/02/2020)    . Triamcinolone Acetonide (NASACORT ALLERGY 24HR NA) Place 1 spray into the nose daily. (Patient not taking: Reported on 06/02/2020)    . Turmeric 500 MG TABS Take 1,000 mg by mouth daily. (Patient not taking: Reported on 06/02/2020)     Current Facility-Administered Medications  Medication Dose Route Frequency Provider Last Rate Last Admin  . tamsulosin (FLOMAX) capsule 0.4 mg  0.4 mg Oral Daily Linden Dolin, MD          Physical Exam:   BP 112/75 (BP Location: Left Arm, Patient Position: Sitting, Cuff Size: Normal)   Pulse 84   Temp 97.7 F (36.5 C)   Resp  18   Wt 108.4 kg   SpO2 97% Comment: RA  BMI 35.81 kg/m   General:  Well-appearing, NAD  Chest:   cta  CV:   rrr  Incisions:  Well-healed  Abdomen:  sntnd  Extremities:  2+ edema  Diagnostic Tests:  CXR w/clear lung fields   Impression:  Doing well after CABG  Plan:  F/u in 2 weeks for wound check Prescription called in for Flomax   I spent in excess of 15 minutes during the conduct of this office consultation and >50% of this time involved direct face-to-face encounter with the patient for counseling and/or coordination of their care.  Level 2                 10 minutes Level 3                 15 minutes Level 4                 25 minutes Level 5                 40 minutes  B. Lorayne Marek, MD 06/02/2020 3:52 PM

## 2020-06-03 ENCOUNTER — Encounter: Payer: Self-pay | Admitting: Family

## 2020-06-03 ENCOUNTER — Ambulatory Visit (INDEPENDENT_AMBULATORY_CARE_PROVIDER_SITE_OTHER): Payer: Medicare Other | Admitting: Family

## 2020-06-03 VITALS — BP 110/64 | HR 88 | Ht 68.5 in | Wt 241.0 lb

## 2020-06-03 DIAGNOSIS — K219 Gastro-esophageal reflux disease without esophagitis: Secondary | ICD-10-CM

## 2020-06-03 DIAGNOSIS — I10 Essential (primary) hypertension: Secondary | ICD-10-CM

## 2020-06-03 DIAGNOSIS — Z951 Presence of aortocoronary bypass graft: Secondary | ICD-10-CM | POA: Diagnosis not present

## 2020-06-03 DIAGNOSIS — I350 Nonrheumatic aortic (valve) stenosis: Secondary | ICD-10-CM

## 2020-06-03 DIAGNOSIS — I25118 Atherosclerotic heart disease of native coronary artery with other forms of angina pectoris: Secondary | ICD-10-CM | POA: Diagnosis not present

## 2020-06-03 DIAGNOSIS — E785 Hyperlipidemia, unspecified: Secondary | ICD-10-CM | POA: Diagnosis not present

## 2020-06-03 DIAGNOSIS — Z952 Presence of prosthetic heart valve: Secondary | ICD-10-CM

## 2020-06-03 MED ORDER — PANTOPRAZOLE SODIUM 20 MG PO TBEC: 20.0000 mg | DELAYED_RELEASE_TABLET | Freq: Every day | ORAL | 1 refills | Status: AC

## 2020-06-03 NOTE — Progress Notes (Signed)
Office Visit    Patient Name: Sean Hubbard Date of Encounter: 06/19/2020  Primary Care Provider:  Lynnea Ferrier, MD Primary Cardiologist:  Julien Nordmann, MD Electrophysiologist:  None   Chief Complaint    Sean Hubbard is a 79 y.o. male with a hx of CAD (s/p 4 stent to RCA in [place] in 1997, s/p CABGx4 05/2020), severe aortic stenosis s/p AVR 05/2020, carotid artery disease (05/2020 bilateral 1-39%), HTN, HLD, GERD, COPD, BPPV presents today for follow up after CABG and AVR.   Past Medical History    Past Medical History:  Diagnosis Date  . Aortic stenosis    a. TTE 04/2020: EF 55 to 60%, Gr1DD, nl RVSF & cavity size, mod dilated LA, mildly dilated RA, trivial mitral regurg, mod-sev aoritc stenosis w/ mean gradient of and VTI 1.01 cm  . CAD (coronary artery disease)    a. PCI x 4 to RCA in 1997 in [place]; b. LHC 05/15/2020: pLAD 95%, mLCx 50-60%, dominant RCA w/ 4 stents from the ostial region extending distally with sequential severe stenosis in the proximal RCA estimated at 95% followed by 90% stenosis, dRCA 80% prior to the bifurcation of RCA branches  . Gout   . Hyperlipemia   . Hypertension    Past Surgical History:  Procedure Laterality Date  . AORTIC VALVE REPLACEMENT N/A 05/16/2020   Procedure: AORTIC VALVE REPLACEMENT (AVR) USING 25 MM EDWARDS INTUITY ELITE VALVE;  Surgeon: Linden Dolin, MD;  Location: [facility] OR;  Service: Open Heart Surgery;  Laterality: N/A;  . CARDIAC CATHETERIZATION    . CORONARY ANGIOPLASTY WITH STENT PLACEMENT  07/18/1996   x 4 to RCA in [place] Dr. Edilia Bo  . CORONARY ARTERY BYPASS GRAFT N/A 05/16/2020   Procedure: CORONARY ARTERY BYPASS GRAFTING (CABG) X 3;  Surgeon: Linden Dolin, MD;  Location: [facility] OR;  Service: Open Heart Surgery;  Laterality: N/A;  BILATERAL IMA  . ENDOVEIN HARVEST OF GREATER SAPHENOUS VEIN Right 05/16/2020   Procedure: ENDOVEIN HARVEST OF GREATER SAPHENOUS VEIN;  Surgeon: Linden Dolin, MD;  Location: [facility] OR;  Service:  Open Heart Surgery;  Laterality: Right;  . LEFT HEART CATH AND CORONARY ANGIOGRAPHY N/A 05/15/2020   Procedure: LEFT HEART CATH AND CORONARY ANGIOGRAPHY;  Surgeon: Antonieta Iba, MD;  Location: [facility] INVASIVE CV LAB;  Service: Cardiovascular;  Laterality: N/A;  . ROTATOR CUFF REPAIR Left   . TEE WITHOUT CARDIOVERSION N/A 05/16/2020   Procedure: TRANSESOPHAGEAL ECHOCARDIOGRAM (TEE);  Surgeon: Linden Dolin, MD;  Location: [facility];  Service: Open Heart Surgery;  Laterality: N/A;    Allergies  No Known Allergies  History of Present Illness    Sean Hubbard is a 79 y.o. male with a hx of  CAD (s/p 4 stent to RCA in [place] in 1997, s/p CABGx4 05/2020), severe aortic stenosis s/p AVR 05/2020, carotid artery disease (05/2020 bilateral 1-39%), HTN, HLD, GERD, COPD, BPPV  last seen while hospitalized.  Ischemic evaluation in 2015 at [facility] by myocardial perfusion study with small perfusion abnormality of moderate intensity in inferior regional of stress images.   Seen by Dr. Mariah Milling 01/2019 with recommendation for echocardiogram due to murmur, but this was not performed. Reported 05/07/20 for clinic visit reporting constant lightheadedness, fatigue, dyspnea on exertion and 3/6 systolic murmur noted on exam. Echocardiogram 05/08/20 LVEF 55-60%, gr1DD, LA moderately dilated, RA mildly dilated, trivial MR, moderate to severe aortic stenosis with mean gradient of . Subsequent cardiac catheterization 05/15/20  showed critical proximal LAD disease (95%), critical RCA disease prox (90-95%) and distal (80%), severe AV stenosis. He was transferred to [facility]. He underwent CABGx4 (LIMA-LAD, SVG-posterior descending coronary artery, SVT-obtuse marginal branch of LCx, pedicled RIMA-right posterior lateral coronary artery graft) and aortic valve replacement 05/16/20 with Dr. Vickey Sages. Postoperative course notable for requiring chest tube, inotropic support, atrial fibrillation treated with IV amiodarone and cardizem. He was  started on Eliquis.   He is grateful to be home as he was just discharged 5 days ago. Denies chest pain, pressure, tightness. Reports no shortness of breath at rest. Continued dyspnea on exertion is stable at his baseline. Has been trying to walk at home and excited to participate in cardiac rehab.   We reviewed hospitalization in detail including atrial fibrillation. Denies bleeding complications on Eliquis. His BP is low normal in clinic, has not checked since being home. Reports no lightheadedness, dizziness, near syncope.   He and his wife just celebrated their anniversary.   EKGs/Labs/Other Studies Reviewed:   The following studies were reviewed today: [facility] 2020-05-17 DetailsCardiac Catheterization Procedure Note  Name: Sean Hubbard MRN: [id number] DOB: Mar 05, 1941  Procedure: Left Heart Cath, Selective Coronary Angiography, LV angiography  Indication:   Sean Hubbard is a 79 y.o. male with a hx of CAD s/p 4 stents to RCA in [place] in 1997, prior tobacco use 4 packs/day, currently occasional cigar,   HTN, HLD, GERD, COPD, BPPV presents today for lightheadedness ,  Dyspnea, unstable angina,  severe aortic valve stenosis on recent echocardiogram..  Procedural details: The right groin was prepped, draped, and anesthetized with 1% lidocaine. Using modified Seldinger technique, a 5 French sheath was introduced into the right femoral artery. Standard Judkins catheters (JL 4, JR 4 and pigtail catheter) were used for coronary angiography and left ventriculography. Catheter exchanges were performed over a guidewire. There were no immediate procedural complications. The patient was transferred to the post catheterization recovery area for further monitoring.  Moderate sedation: 1. Sedation used: 2 mg Versed, 50 mcg fentanyl 2. Time of administration:       12:15 PM     Time patient left for recovery 12:45 PM Total sedation time 30 minutes 3. I was Face to Face with the patient during this time: (code:  [id number])   Procedural Findings:    Coronary angiography:  Coronary dominance: Right  Left mainstem:   Large vessel that bifurcates into the LAD and left circumflex, unable to exclude mild ostial disease  Left anterior descending (LAD):   Large vessel that extends to the apical region, diagonal branch 2 of moderate size, severe proximal LAD disease estimated at 95%  Left circumflex (LCx):  Large vessel with OM branch 2, mild to moderate mid left circumflex disease estimated at 50 to 60%  Right coronary artery (RCA):  Right dominant vessel with PL and PDA, 4 stents from ostial region extending distally, severe stenosis in the proximal region estimated at 95%, sequential lesion proximal region 90%, distal lesion 80% prior to bifurcation  Left ventriculography: Left ventricular systolic function is normal, LVEF is estimated at 55%, there is no significant mitral regurgitation , severe aortic valve stenosis, gradient estimated plus Possibly higher but difficult to determine in the setting of PVCs in a bigeminal pattern   Final Conclusions:   Critical proximal LAD disease, critical RCA disease proximal and distal Severe aortic valve stenosis  Recommendations:  Given severity of his coronary disease, aortic valve stenosis, and worsening symptoms of unstable angina  over the past several weeks, will recommend transfer to [facility] for consideration of AVR with bioprosthetic valve and CABG Case discussed with patient and patient's wife, they are in agreement   Echo 05/08/20  1. Left ventricular ejection fraction, by estimation, is 55 to 60%. The  left ventricle has normal function. Left ventricular endocardial border  not optimally defined to evaluate regional wall motion. Left ventricular  diastolic parameters are consistent  with Grade I diastolic dysfunction (impaired relaxation).   2. Right ventricular systolic function is normal. The right ventricular  size is normal. Tricuspid  regurgitation signal is inadequate for assessing  PA pressure.   3. Left atrial size was moderately dilated.   4. Right atrial size was mildly dilated.   5. The mitral valve is normal in structure. Trivial mitral valve  regurgitation. No evidence of mitral stenosis.   6. The aortic valve is abnormal. Aortic valve regurgitation is not  visualized. Moderate to severe aortic valve stenosis. Aortic valve area,  by VTI measures 1.01 cm. Aortic valve mean gradient measures 37.0 mmHg.   VAS [place] 05/15/20 (carotid, arms/legs) Right Carotid: Velocities in the right ICA are consistent with a 1-39%  stenosis.   Left Carotid: Velocities in the left ICA are consistent with a 1-39%  stenosis.  Vertebrals: Bilateral vertebral arteries demonstrate antegrade flow.   Right ABI: Multiphasic waveforms are noted in the dorsalis pedis and  posterior tibial arteries.  Left ABI: Multiphasic waveforms are noted in the dorsalis pedis and  posterior tibial arteries.  Right Upper Extremity: Doppler waveform obliterate with right radial  compression. Doppler waveform obliterate with right ulnar compression.  Left Upper Extremity: Doppler waveform obliterate with left radial  compression. Doppler waveforms decrease >50% with left ulnar compression.     EKG:  EKG is  ordered today.  The ekg ordered today demonstrates SR 88 bpm with LBBB. No acute ST/T wave changes.  Recent Labs: 05/07/2020: BNP 43.1 05/25/2020: Magnesium 1.7 05/26/2020: ALT 113 05/28/2020: BUN 13; Creatinine, Ser 0.98; Potassium 4.1; Sodium 136 05/29/2020: Hemoglobin 11.1; Platelets 350  Recent Lipid Panel No results found for: CHOL, TRIG, HDL, CHOLHDL, VLDL, LDLCALC, LDLDIRECT  Home Medications   Current Meds  Medication Sig  . allopurinol (ZYLOPRIM) 100 MG tablet Take 100 mg by mouth daily.  Marland Kitchen amiodarone (PACERONE) 200 MG tablet Take 1 tablet (200 mg total) by mouth 2 (two) times daily.  Marland Kitchen apixaban (ELIQUIS) 5 MG TABS tablet Take 1 tablet (5  mg total) by mouth 2 (two) times daily.  . Ascorbic Acid (VITAMIN C) 1000 MG tablet Take 1,000 mg by mouth daily.   Marland Kitchen atorvastatin (LIPITOR) 40 MG tablet Take 40 mg by mouth daily at 6 PM.   . clopidogrel (PLAVIX) 75 MG tablet Take 1 tablet (75 mg total) by mouth daily.  . colchicine 0.6 MG tablet Take 1 tablet (0.6 mg total) by mouth 2 (two) times daily.  Marland Kitchen ezetimibe (ZETIA) 10 MG tablet Take 1 tablet (10 mg total) by mouth daily. Please call to schedule appointment for further refills. Thank you! (Patient taking differently: Take 10 mg by mouth daily at 6 PM. )  . furosemide (LASIX) 40 MG tablet Take 1 tablet (40 mg total) by mouth daily.  Marland Kitchen KRILL OIL PO Take 750 mg by mouth daily.  . metoprolol succinate (TOPROL-XL) 50 MG 24 hr tablet Take 50 mg by mouth every evening. Take with or immediately following a meal.   . OVER THE COUNTER MEDICATION Apply 1 application topically  2 (two) times daily. Vidacann  Arthritis cream  . potassium chloride SA (KLOR-CON) 20 MEQ tablet Take 1 tablet (20 mEq total) by mouth daily.  . Triamcinolone Acetonide (NASACORT ALLERGY 24HR NA) Place 1 spray into the nose daily.   . [DISCONTINUED] omeprazole (PRILOSEC) 20 MG capsule Take 20 mg by mouth daily.    Current Facility-Administered Medications for the 06/03/20 encounter (Office Visit) with Alver SorrowWalker, Halston Kintz S, NP  Medication  . tamsulosin (FLOMAX) capsule 0.4 mg      Review of Systems    Review of Systems  Constitutional: Negative for chills, fever and malaise/fatigue.  Cardiovascular: Positive for dyspnea on exertion. Negative for chest pain, irregular heartbeat, leg swelling, near-syncope, orthopnea, palpitations and syncope.  Respiratory: Negative for cough, shortness of breath and wheezing.   Gastrointestinal: Negative for melena, nausea and vomiting.  Genitourinary: Negative for hematuria.  Neurological: Negative for dizziness, light-headedness and weakness.   All other systems reviewed and are  otherwise negative except as noted above.  Physical Exam    VS:  BP 110/64 (BP Location: Left Arm, Patient Position: Sitting, Cuff Size: Normal)   Pulse 88   Ht 5' 8.5" (1.74 m)   Wt 241 lb (109.3 kg)   SpO2 95%   BMI 36.11 kg/m  , BMI Body mass index is 36.11 kg/m. GEN: Well nourished, overweight, well developed, in no acute distress. HEENT: normal. Neck: Supple, no JVD, carotid bruits, or masses. Cardiac: RRR, no murmurs, rubs, or gallops. No clubbing, cyanosis, edema.  Radials/DP/PT 2+ and equal bilaterally.  Respiratory:  Respirations regular and unlabored, clear to auscultation bilaterally. GI: Soft, nontender, nondistended, BS + x 4. MS: No deformity or atrophy. Skin: Warm and dry, no rash. Midsternal incision clean, dry, intact. R groin with ecchymosis but no hematoma. R SVG site clean, dry, intact without signs of infection. Previous chest tube sites clean, dry, intact without signs of infection.  Neuro:  Strength and sensation are intact. Psych: Normal affect.  Assessment & Plan    1. Severe AS s/p AVR - Continue to follow with TCTS. Incisions healing appropriately. Plans to participate in cardiac rehab.   Will require SBE prophylaxis, provide prescription at follow up.  2. CAD s/p CABG - Stable with no anginal symptoms. Surgical incisions healing appropriately. GDMT includes Plavix, Metoprolol, Atorvastatin, Zetia. Plans to participate in cardiac rehab. 3 walks per day and low sodium, heart healthy diet encouraged.   3. GERD - As he is on Plavix, transition Omeprazole to Pantoprazole 20mg  daily.  4. Postoperative atrial fibrillation - Recurrent atrial fib during his postoperative course converted chemically with IV amiodarone. He was started on Eliquis at time of hospital discharge. EKG today shows he is maintaining NSR. After one week, reduce Amiodarone to 200mg  daily. If he continues to maintain NSR, discontinuation of Amiodarone can be considered in a few months. Denies  bleeding complications on Eliquis. Plan for CBC at follow up in 1 month.  5. HLD, LDL <70 - Continue Atorvastatin 40mg  daily and Zetia 10mg  daily. As he is not fasting today, plan for lipid panel at follow up in 1 month. If LDL not at goal, will likely require PCSK9i.  Disposition: Follow up in 1 month(s) with Dr. Mariah MillingGollan or APP.   Alver Sorrowaitlin S Delaina Fetsch, NP 18-Mar-2020, 7:45 AM

## 2020-06-03 NOTE — Patient Instructions (Signed)
Medication Instructions:  Your physician has recommended you make the following change in your medication:   STOP Omeprazole   START Pantoprazole (Protonix) 20mg  daily   In one week ... CHANGE Amiodarone to 200mg  once daily  *If you need a refill on your cardiac medications before your next appointment, please call your pharmacy*  Lab Work: No lab work today.  If you have labs (blood work) drawn today and your tests are completely normal, you will receive your results only by: MyChart Message (if you have MyChart) OR . A paper copy in the mail If you have any lab test that is abnormal or we need to change your treatment, we will call you to review the results.  Testing/Procedures: Your EKG today shows normal sinus rhythm.   Follow-Up: At [facility], you and your health needs are our priority.  As part of our continuing mission to provide you with exceptional heart care, we have created designated Provider Care Teams.  These Care Teams include your primary Cardiologist (physician) and Advanced Practice Providers (APPs -  Physician Assistants and Nurse Practitioners) who all work together to provide you with the care you need, when you need it.  We recommend signing up for the patient portal called "MyChart".  Sign up information is provided on this After Visit Summary.  MyChart is used to connect with patients for Virtual Visits (Telemedicine).  Patients are able to view lab/test results, encounter notes, upcoming appointments, etc.  Non-urgent messages can be sent to your provider as well.   To learn more about what you can do with MyChart, go to [employer] [employer].    Your next appointment:   1 month(s)  The format for your next appointment:   In Person  Provider:    You may see [facility], MD or one of the following Advanced Practice Providers on your designated Care Team:    [email or web address], NP  Julien Nordmann, PA-C  Nicolasa Ducking, PA-C  Eula Listen,  NP  Other Instructions

## 2020-06-04 ENCOUNTER — Telehealth: Payer: Self-pay | Admitting: Cardiovascular Disease

## 2020-06-15 NOTE — Telephone Encounter (Signed)
Called [facility], they were aware.  Primary cardiologist aware.   Print production planner made aware.   Alver Sorrow, NP

## 2020-06-15 NOTE — Telephone Encounter (Signed)
Patients wife called this morning to make office aware that patient passed through the night while in bed. All future appointments have been cancelled at this time.

## 2020-06-15 DEATH — deceased

## 2020-06-16 ENCOUNTER — Ambulatory Visit: Payer: 59 | Admitting: Cardiothoracic Surgery

## 2020-07-09 ENCOUNTER — Ambulatory Visit: Payer: 59 | Admitting: Family
